# Patient Record
Sex: Female | Born: 1955
Health system: Southern US, Community
[De-identification: ages and names within clinical notes are randomized; demographics above are authoritative.]

## PROBLEM LIST (undated history)

## (undated) DIAGNOSIS — S8263XA Displaced fracture of lateral malleolus of unspecified fibula, initial encounter for closed fracture: Secondary | ICD-10-CM

## (undated) DIAGNOSIS — I10 Essential (primary) hypertension: Secondary | ICD-10-CM

## (undated) DIAGNOSIS — E785 Hyperlipidemia, unspecified: Secondary | ICD-10-CM

## (undated) DIAGNOSIS — R195 Other fecal abnormalities: Secondary | ICD-10-CM

## (undated) HISTORY — DX: Displaced fracture of lateral malleolus of unspecified fibula, initial encounter for closed fracture: S82.63XA

## (undated) HISTORY — DX: Hyperlipidemia, unspecified: E78.5

## (undated) HISTORY — DX: Other fecal abnormalities: R19.5

## (undated) HISTORY — DX: Essential (primary) hypertension: I10

---

## 2001-01-20 DIAGNOSIS — I10 Essential (primary) hypertension: Secondary | ICD-10-CM

## 2001-01-20 HISTORY — DX: Essential (primary) hypertension: I10

## 2003-01-21 HISTORY — PX: CHOLECYSTECTOMY: SHX55

## 2005-01-20 HISTORY — PX: MOHS SURGERY: SUR867

## 2005-01-20 HISTORY — PX: SQUAMOUS CELL CARCINOMA EXCISION: SHX2433

## 2006-01-26 LAB — HM DEXA SCAN

## 2007-01-21 DIAGNOSIS — S8263XA Displaced fracture of lateral malleolus of unspecified fibula, initial encounter for closed fracture: Secondary | ICD-10-CM

## 2007-01-21 HISTORY — DX: Displaced fracture of lateral malleolus of unspecified fibula, initial encounter for closed fracture: S82.63XA

## 2010-04-17 LAB — PULMONARY FUNCTION TEST

## 2011-05-27 LAB — FECAL OCCULT BLOOD, GUAIAC: Fecal Occult Blood: NEGATIVE

## 2011-09-27 LAB — HM PAP SMEAR: HM Pap smear: NORMAL

## 2011-09-27 LAB — HM MAMMOGRAPHY: HM Mammogram: NORMAL

## 2012-01-27 ENCOUNTER — Encounter: Payer: Self-pay | Admitting: Internal Medicine

## 2012-01-27 ENCOUNTER — Ambulatory Visit (INDEPENDENT_AMBULATORY_CARE_PROVIDER_SITE_OTHER): Payer: BC Managed Care – HMO | Admitting: Internal Medicine

## 2012-01-27 VITALS — BP 118/76 | HR 75 | Temp 98.1°F | Resp 16 | Ht 65.0 in | Wt 148.0 lb

## 2012-01-27 DIAGNOSIS — Z9889 Other specified postprocedural states: Secondary | ICD-10-CM

## 2012-01-27 DIAGNOSIS — E785 Hyperlipidemia, unspecified: Secondary | ICD-10-CM

## 2012-01-27 DIAGNOSIS — Z86007 Personal history of in-situ neoplasm of skin: Secondary | ICD-10-CM

## 2012-01-27 DIAGNOSIS — Z1211 Encounter for screening for malignant neoplasm of colon: Secondary | ICD-10-CM

## 2012-01-27 DIAGNOSIS — Z85828 Personal history of other malignant neoplasm of skin: Secondary | ICD-10-CM

## 2012-01-27 DIAGNOSIS — I10 Essential (primary) hypertension: Secondary | ICD-10-CM

## 2012-01-27 MED ORDER — ZOLPIDEM TARTRATE 10 MG PO TABS: 10.0000 mg | ORAL_TABLET | Freq: Every evening | ORAL | Status: DC | PRN

## 2012-01-27 NOTE — Assessment & Plan Note (Signed)
Managed with  Zocor.  Last known LDl 118, TL 199,  HDL 70  on 20 mg in 2011.  Due for lfts and lipids

## 2012-01-27 NOTE — Progress Notes (Signed)
Patient ID: Amber Sullivan, female   DOB: [birth year], 57 y.o.   MRN: [id number]   Patient Active Problem List  Diagnosis  . Hyperlipidemia  . Hypertension    Subjective:  CC:   Chief Complaint  Patient presents with  . Establish Care    HPI:   Amber Sullivan is a 57 y.o. female who presents as a new patient to establish primary care with the chief complaint of need for Primary care.  She has relocated from [place] and has a history of hypertension since age 83 along with hyperlipidemia.  Both are controlled with meds. She has a history of intolerance to  ACE inhibitor which caused a dry cough which has improved but not resolved with losartan. She has had thorough testing with pulmonology at [facility] in [place]. Last PCP was Benjiman Core, family practice who left to work at [employer] in Jun 07, 2009.  GYN through [facility],  Last PAP smear 2013 August,  History of abnormals so gets them annually.  Prior colposcopies.  Mammograms every birthday. No prior colonoscopy.  Needs a  Dermatologist ,  History of moh's surgery 7 yrs ago for squamous cell taken of  Right naso labial fold.    History of insomnia since death of husband 07-Jun-2001,  Uses alprazolam prn now, not daily . 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     Past Medical History  Diagnosis Date  . Hyperlipidemia   . Hypertension     Past Surgical History  Procedure Date  . Cholecystectomy 2003/06/08  . Squamous cell carcinoma excision 2005-06-07    mole on face  . Mohs surgery Jun 07, 2005    squamou cell carcinoma rt nasolabial fold    Family History  Problem Relation Age of Onset  . Hypertension Mother   . Diabetes Mother     type 2  . Stroke Mother 2    massive, with aphasia and paraplegia  . Hypertension Father   . Hyperlipidemia Father   . Heart attack Father     vs PE during hospitalization for chest pain   . Heart disease Father   . Heart attack Maternal Grandfather   . Heart disease Paternal Grandmother     CHF  . Heart disease  Paternal Grandfather   . Heart attack Paternal Grandfather     History   Social History  . Marital Status: Married    Spouse Name: N/A    Number of Children: N/A  . Years of Education: N/A   Occupational History  . Not on file.   Social History Main Topics  . Smoking status: Never Smoker   . Smokeless tobacco: Not on file  . Alcohol Use: Yes  . Drug Use: No  . Sexually Active:    Other Topics Concern  . Not on file   Social History Narrative  . No narrative on file         @ALLHX @    Review of Systems:   The remainder of the review of systems was negative except those addressed in the HPI.       Objective:  BP 118/76  Pulse 75  Temp 98.1 F (36.7 C) (Oral)  Resp 16  Ht 5\' 5"  (1.651 m)  Wt 148 lb (67.132 kg)  BMI 24.63 kg/m2  SpO2 99%  General appearance: alert, cooperative and appears stated age Ears: normal TM's and external ear canals both ears Throat: lips, mucosa, and tongue normal; teeth and gums normal Neck: no adenopathy, no carotid bruit, supple, symmetrical,  trachea midline and thyroid not enlarged, symmetric, no tenderness/mass/nodules Back: symmetric, no curvature. ROM normal. No CVA tenderness. Lungs: clear to auscultation bilaterally Heart: regular rate and rhythm, S1, S2 normal, no murmur, click, rub or gallop Abdomen: soft, non-tender; bowel sounds normal; no masses,  no organomegaly Pulses: 2+ and symmetric Skin: Skin color, texture, turgor normal. No rashes or lesions Lymph nodes: Cervical, supraclavicular, and axillary nodes normal.  Assessment and Plan:  Hyperlipidemia Managed with  Zocor.  Last known LDl 118, TL 199,  HDL 70  on 20 mg in 2011.  Due for lfts and lipids    Hypertension well controlled on losartan.  Due for BMET   Updated Medication List Outpatient Encounter Prescriptions as of 01/27/2012  Medication Sig Dispense Refill  . estradiol (ESTRACE) 0.1 MG/GM vaginal cream Place 2 g vaginally 2 (two) times a  week.      . losartan (COZAAR) 100 MG tablet Take 100 mg by mouth daily.       . simvastatin (ZOCOR) 20 MG tablet Take 20 mg by mouth at bedtime.       Marland Kitchen zolpidem (AMBIEN) 10 MG tablet Take 1 tablet (10 mg total) by mouth at bedtime as needed.  90 tablet  3  . [DISCONTINUED] zolpidem (AMBIEN) 10 MG tablet Take 10 mg by mouth at bedtime as needed.         Orders Placed This Encounter  Procedures  . HM MAMMOGRAPHY  . HM DEXA SCAN  . Fecal Occult Blood, Guaiac    No Follow-up on file.

## 2012-01-27 NOTE — Assessment & Plan Note (Signed)
well controlled on losartan.  Due for BMET

## 2012-01-27 NOTE — Patient Instructions (Signed)
Referrals to dermatology and GI in process,   Set up an appt for fasting labs including tsh and vit d

## 2012-01-28 DIAGNOSIS — Z1211 Encounter for screening for malignant neoplasm of colon: Secondary | ICD-10-CM | POA: Insufficient documentation

## 2012-01-28 DIAGNOSIS — Z86007 Personal history of in-situ neoplasm of skin: Secondary | ICD-10-CM | POA: Insufficient documentation

## 2012-01-28 DIAGNOSIS — Z9889 Other specified postprocedural states: Secondary | ICD-10-CM | POA: Insufficient documentation

## 2012-01-28 NOTE — Assessment & Plan Note (Signed)
2007, right nasolabial fold.

## 2012-01-28 NOTE — Assessment & Plan Note (Signed)
She has had annual fecal occult blood tests done during her GYN exam but has not had a prior colonoscopy. She has no family history of colon cancer and no personal history of hematochezia. Referral to Dr. if Jerene Dilling Dr. Mechele Collin for colon cancer screening.

## 2012-01-28 NOTE — Addendum Note (Signed)
Addended by: Sherlene Shams on: 01/28/2012 07:20 AM   Modules accepted: Orders

## 2012-02-12 ENCOUNTER — Telehealth: Payer: Self-pay | Admitting: *Deleted

## 2012-02-12 NOTE — Telephone Encounter (Signed)
Pt is coming in for labs on 01.28.2014 what labs and dx would you like?

## 2012-02-12 NOTE — Telephone Encounter (Signed)
CMET, fasting lipids, TSH . thanks

## 2012-02-13 ENCOUNTER — Other Ambulatory Visit: Payer: Self-pay | Admitting: *Deleted

## 2012-02-16 ENCOUNTER — Telehealth: Payer: Self-pay | Admitting: *Deleted

## 2012-02-16 NOTE — Telephone Encounter (Signed)
Prior Authorization faxed to [phone]

## 2012-02-17 ENCOUNTER — Other Ambulatory Visit: Payer: Self-pay | Admitting: *Deleted

## 2012-02-17 ENCOUNTER — Other Ambulatory Visit (INDEPENDENT_AMBULATORY_CARE_PROVIDER_SITE_OTHER): Payer: BC Managed Care – HMO

## 2012-02-17 DIAGNOSIS — Z139 Encounter for screening, unspecified: Secondary | ICD-10-CM

## 2012-02-17 LAB — COMPREHENSIVE METABOLIC PANEL
ALT: 16 U/L (ref 0–35)
AST: 15 U/L (ref 0–37)
Albumin: 3.7 g/dL (ref 3.5–5.2)
Alkaline Phosphatase: 61 U/L (ref 39–117)
BUN: 10 mg/dL (ref 6–23)
CO2: 28 mEq/L (ref 19–32)
Calcium: 8.8 mg/dL (ref 8.4–10.5)
Chloride: 106 mEq/L (ref 96–112)
Creatinine, Ser: 0.7 mg/dL (ref 0.4–1.2)
GFR: 85.57 mL/min (ref >60.00)
Glucose, Bld: 89 mg/dL (ref 70–99)
Potassium: 4.3 mEq/L (ref 3.5–5.1)
Sodium: 139 mEq/L (ref 135–145)
Total Bilirubin: 1 mg/dL (ref 0.3–1.2)
Total Protein: 6.4 g/dL (ref 6.0–8.3)

## 2012-02-17 LAB — LIPID PANEL
Cholesterol: 171 mg/dL (ref 0–200)
HDL: 51.8 mg/dL (ref >39.00)
LDL Cholesterol: 104 mg/dL — ABNORMAL HIGH (ref 0–99)
Total CHOL/HDL Ratio: 3
Triglycerides: 74 mg/dL (ref 0.0–149.0)
VLDL: 14.8 mg/dL (ref 0.0–40.0)

## 2012-02-17 LAB — TSH: TSH: 1.19 u[IU]/mL (ref 0.35–5.50)

## 2012-04-14 ENCOUNTER — Telehealth: Payer: Self-pay | Admitting: General Practice

## 2012-04-14 MED ORDER — SIMVASTATIN 20 MG PO TABS: 20.0000 mg | ORAL_TABLET | Freq: Every day | ORAL | Status: DC

## 2012-04-14 NOTE — Telephone Encounter (Signed)
Pt called stating she needs a refill of her simvastatin. This is a historical med never filled by our office. Lipid panel completed by our office on 02/17/12. Please advise.

## 2012-04-14 NOTE — Telephone Encounter (Signed)
Med filled.  

## 2012-04-14 NOTE — Telephone Encounter (Signed)
Ok to refill,  Authorized in [employer].  No pharmacy listed

## 2012-05-10 ENCOUNTER — Telehealth: Payer: Self-pay | Admitting: *Deleted

## 2012-05-10 ENCOUNTER — Encounter: Payer: Self-pay | Admitting: *Deleted

## 2012-05-10 DIAGNOSIS — E785 Hyperlipidemia, unspecified: Secondary | ICD-10-CM

## 2012-05-10 DIAGNOSIS — Z79899 Other long term (current) drug therapy: Secondary | ICD-10-CM

## 2012-05-10 MED ORDER — SIMVASTATIN 20 MG PO TABS: 20.0000 mg | ORAL_TABLET | Freq: Every day | ORAL | Status: DC

## 2012-05-10 MED ORDER — LOSARTAN POTASSIUM 100 MG PO TABS: 100.0000 mg | ORAL_TABLET | Freq: Every day | ORAL | Status: DC

## 2012-05-10 NOTE — Telephone Encounter (Signed)
Patient called stating that she requested a refill on her BP and cholesterol medication a month ago. Patient states that she checked with [facility] and they have no record of receiving the refills. Refills sent to pharmacy electronically per patient's request. Patient aware. Please advise when patient needs liver function test?

## 2012-05-10 NOTE — Telephone Encounter (Signed)
The chart has a record of Shanda Bumps calling the simvastatin to [facility] on 3/26 , no mention of BP meds.  Ok to refill both,  Due for fasting lipids and CMET end of July

## 2012-08-24 ENCOUNTER — Telehealth: Payer: Self-pay | Admitting: *Deleted

## 2012-08-24 NOTE — Telephone Encounter (Signed)
This patient never returned for her blood tests in July.  Please remind her to come in ASAP for fasting labs and follow up OV

## 2012-08-24 NOTE — Telephone Encounter (Signed)
Patient requesting refill on Ambien, but need clarification on pharmacy, called patient left voicemail for return call. Patient has not been seen in office since 1/14, please advise as to refill.

## 2012-08-25 NOTE — Telephone Encounter (Signed)
Left message for patient to return call.

## 2012-08-27 NOTE — Telephone Encounter (Signed)
Patient lab scheduled for 9/9

## 2012-08-30 ENCOUNTER — Ambulatory Visit (INDEPENDENT_AMBULATORY_CARE_PROVIDER_SITE_OTHER): Payer: BC Managed Care – HMO | Admitting: Internal Medicine

## 2012-08-30 ENCOUNTER — Encounter: Payer: Self-pay | Admitting: Internal Medicine

## 2012-08-30 VITALS — BP 134/72 | HR 79 | Temp 98.2°F | Resp 14 | Wt 145.0 lb

## 2012-08-30 DIAGNOSIS — R609 Edema, unspecified: Secondary | ICD-10-CM

## 2012-08-30 DIAGNOSIS — I10 Essential (primary) hypertension: Secondary | ICD-10-CM

## 2012-08-30 MED ORDER — TRAMADOL HCL 50 MG PO TABS: 50.0000 mg | ORAL_TABLET | Freq: Three times a day (TID) | ORAL | Status: DC | PRN

## 2012-08-30 MED ORDER — MELOXICAM 15 MG PO TABS: 15.0000 mg | ORAL_TABLET | Freq: Every day | ORAL | Status: DC

## 2012-08-30 NOTE — Progress Notes (Signed)
Patient ID: Amber Sullivan, female   DOB: 09-Oct-1955, 57 y.o.   MRN: [id number]  Patient Active Problem List   Diagnosis Date Noted  . Edema 08/30/2012  . History of Mohs surgery for squamous cell carcinoma in situ of skin 01/28/2012  . Screening for colon cancer 01/28/2012  . Hyperlipidemia   . Hypertension     Subjective:  CC:   Chief Complaint  Patient presents with  . Acute Visit    ankle and feet swelling, if patient elevates legs sweliing disapates in about an hour.    HPI:   Amber Sullivan a 57 y.o. female who presents with edema.  Bilateral ankle edema for several weeks.  She has not had any recent travel. She has been working quite aggressively in her yard and has been having joint pain which is been treated with Aleve and ibuprofen daily for at least 4-5 times per week for several  weeks.   Past Medical History  Diagnosis Date  . Hyperlipidemia   . Hypertension     Past Surgical History  Procedure Laterality Date  . Cholecystectomy  2005  . Squamous cell carcinoma excision  2007    mole on face  . Mohs surgery  2007    squamou cell carcinoma rt nasolabial fold       The following portions of the patient's history were reviewed and updated as appropriate: Allergies, current medications, and problem list.    Review of Systems:   12 Pt  review of systems was negative except those addressed in the HPI,     History   Social History  . Marital Status: Married    Spouse Name: N/A    Number of Children: N/A  . Years of Education: N/A   Occupational History  . Not on file.   Social History Main Topics  . Smoking status: Never Smoker   . Smokeless tobacco: Never Used  . Alcohol Use: Yes  . Drug Use: No  . Sexually Active: Not on file   Other Topics Concern  . Not on file   Social History Narrative  . No narrative on file    Objective:  Filed Vitals:   08/30/12 1609  BP: 134/72  Pulse: 79  Temp: 98.2 F (36.8 C)  Resp: 14      General appearance: alert, cooperative and appears stated age Ears: normal TM's and external ear canals both ears Throat: lips, mucosa, and tongue normal; teeth and gums normal Neck: no adenopathy, no carotid bruit, supple, symmetrical, trachea midline and thyroid not enlarged, symmetric, no tenderness/mass/nodules Back: symmetric, no curvature. ROM normal. No CVA tenderness. Lungs: clear to auscultation bilaterally Heart: regular rate and rhythm, S1, S2 normal, no murmur, click, rub or gallop Abdomen: soft, non-tender; bowel sounds normal; no masses,  no organomegaly Pulses: 2+ and symmetric Skin: Skin color, texture, turgor normal. No rashes or lesions.  Nonpitting ankle edema Lymph nodes: Cervical, supraclavicular, and axillary nodes normal.  Assessment and Plan:  Edema Ruling out renal dysfunction, nephropathy, hypothyroidism and congestive heart failure with blood work. Patient will stop/suspend all NSAIDs to see if edema resolves. If all labs are normal and fluid retention resolves she can resume either ibuprofen or meloxicam when necessary an alternative tramadol/Tylenol   Updated Medication List Outpatient Encounter Prescriptions as of 08/30/2012  Medication Sig Dispense Refill  . aspirin 81 MG tablet Take 81 mg by mouth daily.      Marland Kitchen estradiol (ESTRACE) 0.1 MG/GM vaginal cream Place 2 g vaginally  2 (two) times a week.      . fish oil-omega-3 fatty acids 1000 MG capsule Take 2 g by mouth daily.      Marland Kitchen losartan (COZAAR) 100 MG tablet Take 1 tablet (100 mg total) by mouth daily.  90 tablet  1  . simvastatin (ZOCOR) 20 MG tablet Take 1 tablet (20 mg total) by mouth at bedtime.  90 tablet  1  . zolpidem (AMBIEN) 10 MG tablet Take 1 tablet (10 mg total) by mouth at bedtime as needed.  90 tablet  3  . meloxicam (MOBIC) 15 MG tablet Take 1 tablet (15 mg total) by mouth daily.  30 tablet  3  . traMADol (ULTRAM) 50 MG tablet Take 1 tablet (50 mg total) by mouth every 8 (eight) hours  as needed for pain.  60 tablet  0   No facility-administered encounter medications on file as of 08/30/2012.     Orders Placed This Encounter  Procedures  . Comprehensive metabolic panel  . TSH  . Microalbumin / creatinine urine ratio    No Follow-up on file.

## 2012-08-30 NOTE — Patient Instructions (Addendum)
Your Fluid retention may be due to the ibuprofen/aleve  you have been taking for joint pain   suspend all NSAIDs:  aleve,  Motrin,  But continue your  aspirin (81 mg daily)    If labs are normal, you can resume  Motrin/aleve   sparingly or try  the meloxicam  once daily .  You can also combine any of these with tylenol but not with each other,  You may also use tylenol and tramadol (both are pain relievers,  Not anti  Inflammatories)    tylenol max dose daily is  2000 mg   In a 24 hour base

## 2012-08-30 NOTE — Assessment & Plan Note (Signed)
Ruling out renal dysfunction, nephropathy, hypothyroidism and congestive heart failure with blood work. Patient will stop/suspend all NSAIDs to see if edema resolves. If all labs are normal and fluid retention resolves she can resume either ibuprofen or meloxicam when necessary an alternative tramadol/Tylenol

## 2012-08-31 LAB — COMPREHENSIVE METABOLIC PANEL
ALT: 18 U/L (ref 0–35)
AST: 19 U/L (ref 0–37)
Albumin: 4 g/dL (ref 3.5–5.2)
Alkaline Phosphatase: 62 U/L (ref 39–117)
BUN: 12 mg/dL (ref 6–23)
CO2: 25 mEq/L (ref 19–32)
Calcium: 9.2 mg/dL (ref 8.4–10.5)
Chloride: 104 mEq/L (ref 96–112)
Creatinine, Ser: 0.7 mg/dL (ref 0.4–1.2)
GFR: 85.41 mL/min (ref >60.00)
Glucose, Bld: 75 mg/dL (ref 70–99)
Potassium: 4.4 mEq/L (ref 3.5–5.1)
Sodium: 140 mEq/L (ref 135–145)
Total Bilirubin: 0.7 mg/dL (ref 0.3–1.2)
Total Protein: 6.9 g/dL (ref 6.0–8.3)

## 2012-08-31 LAB — TSH: TSH: 1.67 u[IU]/mL (ref 0.35–5.50)

## 2012-08-31 LAB — MICROALBUMIN / CREATININE URINE RATIO
Creatinine,U: 42.5 mg/dL
Microalb Creat Ratio: 1.2 mg/g (ref 0.0–30.0)
Microalb, Ur: 0.5 mg/dL (ref 0.0–1.9)

## 2012-09-01 ENCOUNTER — Encounter: Payer: Self-pay | Admitting: *Deleted

## 2012-09-06 ENCOUNTER — Telehealth: Payer: Self-pay | Admitting: Internal Medicine

## 2012-09-06 MED ORDER — ZOLPIDEM TARTRATE 10 MG PO TABS: 10.0000 mg | ORAL_TABLET | Freq: Every evening | ORAL | Status: DC | PRN

## 2012-09-06 NOTE — Telephone Encounter (Signed)
I'm a patient of Dr. Darrick Huntsman. I was in the office on Monday, August 11. I'd asked for a prescription to be called in to [facility] for Ambien. Cathy, Dr. Melina Schools nurse, said a one-month prescription had already been called in. I checked online with [facility] today and had spoke with them by phone last week. There's no current prescription called in for me. I can't renew automatically because it's a limited 50-month prescription, which ended July 27, 2012.  I can come in and pick up the prescription next week; I want to take the Ambien with me on an upcoming trip to [place]. My cellphone: [phone].  Amber Sullivan

## 2012-09-06 NOTE — Telephone Encounter (Signed)
[facility] on file have canceled that script have new one printed. Have new one printed will call patient. Script signed and faxed.

## 2012-09-28 ENCOUNTER — Other Ambulatory Visit (INDEPENDENT_AMBULATORY_CARE_PROVIDER_SITE_OTHER): Payer: BC Managed Care – HMO

## 2012-09-28 DIAGNOSIS — E785 Hyperlipidemia, unspecified: Secondary | ICD-10-CM

## 2012-09-28 DIAGNOSIS — Z79899 Other long term (current) drug therapy: Secondary | ICD-10-CM

## 2012-09-28 LAB — COMPREHENSIVE METABOLIC PANEL
ALT: 17 U/L (ref 0–35)
AST: 21 U/L (ref 0–37)
Albumin: 3.8 g/dL (ref 3.5–5.2)
Alkaline Phosphatase: 56 U/L (ref 39–117)
BUN: 10 mg/dL (ref 6–23)
CO2: 26 mEq/L (ref 19–32)
Calcium: 8.9 mg/dL (ref 8.4–10.5)
Chloride: 104 mEq/L (ref 96–112)
Creatinine, Ser: 0.8 mg/dL (ref 0.4–1.2)
GFR: 75.85 mL/min (ref >60.00)
Glucose, Bld: 99 mg/dL (ref 70–99)
Potassium: 4.2 mEq/L (ref 3.5–5.1)
Sodium: 139 mEq/L (ref 135–145)
Total Bilirubin: 0.9 mg/dL (ref 0.3–1.2)
Total Protein: 6.4 g/dL (ref 6.0–8.3)

## 2012-09-28 LAB — LIPID PANEL
Cholesterol: 182 mg/dL (ref 0–200)
HDL: 52.7 mg/dL (ref >39.00)
LDL Cholesterol: 116 mg/dL — ABNORMAL HIGH (ref 0–99)
Total CHOL/HDL Ratio: 3
Triglycerides: 69 mg/dL (ref 0.0–149.0)
VLDL: 13.8 mg/dL (ref 0.0–40.0)

## 2012-10-01 ENCOUNTER — Encounter: Payer: Self-pay | Admitting: *Deleted

## 2012-10-11 ENCOUNTER — Encounter: Payer: Self-pay | Admitting: *Deleted

## 2012-10-12 ENCOUNTER — Ambulatory Visit (INDEPENDENT_AMBULATORY_CARE_PROVIDER_SITE_OTHER): Payer: BC Managed Care – HMO | Admitting: Internal Medicine

## 2012-10-12 ENCOUNTER — Other Ambulatory Visit: Payer: BC Managed Care – HMO | Admitting: Internal Medicine

## 2012-10-12 ENCOUNTER — Encounter: Payer: Self-pay | Admitting: Internal Medicine

## 2012-10-12 ENCOUNTER — Other Ambulatory Visit (HOSPITAL_COMMUNITY)
Admission: RE | Admit: 2012-10-12 | Discharge: 2012-10-12 | Disposition: A | Discharge status: Home / Self Care | Payer: BC Managed Care – HMO | Source: Ambulatory Visit | Attending: Internal Medicine | Admitting: Internal Medicine

## 2012-10-12 VITALS — BP 138/80 | HR 64 | Temp 98.4°F | Resp 14 | Ht 65.0 in | Wt 146.5 lb

## 2012-10-12 DIAGNOSIS — Z1211 Encounter for screening for malignant neoplasm of colon: Secondary | ICD-10-CM

## 2012-10-12 DIAGNOSIS — Z124 Encounter for screening for malignant neoplasm of cervix: Secondary | ICD-10-CM

## 2012-10-12 DIAGNOSIS — Z23 Encounter for immunization: Secondary | ICD-10-CM

## 2012-10-12 DIAGNOSIS — Z01419 Encounter for gynecological examination (general) (routine) without abnormal findings: Secondary | ICD-10-CM | POA: Insufficient documentation

## 2012-10-12 DIAGNOSIS — E785 Hyperlipidemia, unspecified: Secondary | ICD-10-CM

## 2012-10-12 DIAGNOSIS — I10 Essential (primary) hypertension: Secondary | ICD-10-CM

## 2012-10-12 DIAGNOSIS — Z0000 Encounter for general adult medical examination without abnormal findings: Secondary | ICD-10-CM

## 2012-10-12 DIAGNOSIS — Z1151 Encounter for screening for human papillomavirus (HPV): Secondary | ICD-10-CM | POA: Insufficient documentation

## 2012-10-12 DIAGNOSIS — Z1239 Encounter for other screening for malignant neoplasm of breast: Secondary | ICD-10-CM

## 2012-10-12 DIAGNOSIS — R609 Edema, unspecified: Secondary | ICD-10-CM

## 2012-10-12 NOTE — Progress Notes (Signed)
Patient ID: Amber Sullivan, female   DOB: [birth year], 57 y.o.   MRN: [id number]    Subjective:    Amber Sullivan is a 57 y.o. female who presents for an annual exam. The patient has no complaints today. The patient is sexually active. GYN screening history: last pap: was normal. The patient wears seatbelts: yes. The patient participates in regular exercise: yes. Has the patient ever been transfused or tattooed?: no. The patient reports that there is not domestic violence in her life.   Menstrual History: OB History   Grav Para Term Preterm Abortions TAB SAB Ect Mult Living                  Menarche age: 36  No LMP recorded. Patient is postmenopausal.    The following portions of the patient's history were reviewed and updated as appropriate: allergies, current medications, past family history, past medical history, past social history, past surgical history and problem list.  Review of Systems A comprehensive review of systems was negative.    Objective:   BP 138/80  Pulse 64  Temp(Src) 98.4 F (36.9 C) (Oral)  Resp 14  Ht 5\' 5"  (1.651 m)  Wt 146 lb 8 oz (66.452 kg)  BMI 24.38 kg/m2  SpO2 99%  General Appearance:    Alert, cooperative, no distress, appears stated age  Head:    Normocephalic, without obvious abnormality, atraumatic  Eyes:    PERRL, conjunctiva/corneas clear, EOM's intact, fundi    benign, both eyes  Ears:    Normal TM's and external ear canals, both ears  Nose:   Nares normal, septum midline, mucosa normal, no drainage    or sinus tenderness  Throat:   Lips, mucosa, and tongue normal; teeth and gums normal  Neck:   Supple, symmetrical, trachea midline, no adenopathy;    thyroid:  no enlargement/tenderness/nodules; no carotid   bruit or JVD  Back:     Symmetric, no curvature, ROM normal, no CVA tenderness  Lungs:     Clear to auscultation bilaterally, respirations unlabored  Chest Wall:    No tenderness or deformity   Heart:    Regular rate and rhythm, S1  and S2 normal, no murmur, rub   or gallop  Breast Exam:    No tenderness, masses, or nipple abnormality  Abdomen:     Soft, non-tender, bowel sounds active all four quadrants,    no masses, no organomegaly  Genitalia:    Pelvic: cervix normal in appearance, external genitalia normal, no adnexal masses or tenderness, no cervical motion tenderness, rectovaginal septum normal, uterus normal size, shape, and consistency and vagina normal without discharge  Extremities:   Extremities normal, atraumatic, no cyanosis or edema  Pulses:   2+ and symmetric all extremities  Skin:   Skin color, texture, turgor normal, no rashes or lesions  Lymph nodes:   Cervical, supraclavicular, and axillary nodes normal  Neurologic:   CNII-XII intact, normal strength, sensation and reflexes    throughout     Assessment:   Edema Secondary to use of NSAIDs.  Now resolved.  Workup for other causes negative  Hyperlipidemia LDL and triglycerides are at goal on current medications. She has no side effects and liver enzymes are normal. No changes today   Hypertension Well controlled on current regimen. Renal function stable, no changes today.  Routine general medical examination at a health care facility Annual comprehensive exam was done including breast, pelvic and PAP smear. All screenings have been addressed .  Screening for colon cancer She never had the colonoscopy due to loss of Dr Niel Hummer to practice.  IFOBs gven   Updated Medication List Outpatient Encounter Prescriptions as of 10/12/2012  Medication Sig Dispense Refill  . aspirin 81 MG tablet Take 81 mg by mouth daily.      Marland Kitchen estradiol (ESTRACE) 0.1 MG/GM vaginal cream Place 2 g vaginally 2 (two) times a week.      . fish oil-omega-3 fatty acids 1000 MG capsule Take 2 g by mouth daily.      Marland Kitchen losartan (COZAAR) 100 MG tablet Take 1 tablet (100 mg total) by mouth daily.  90 tablet  1  . simvastatin (ZOCOR) 20 MG tablet Take 1 tablet (20 mg total) by  mouth at bedtime.  90 tablet  1  . zolpidem (AMBIEN) 10 MG tablet Take 1 tablet (10 mg total) by mouth at bedtime as needed.  90 tablet  3  . meloxicam (MOBIC) 15 MG tablet Take 1 tablet (15 mg total) by mouth daily.  30 tablet  3  . traMADol (ULTRAM) 50 MG tablet Take 1 tablet (50 mg total) by mouth every 8 (eight) hours as needed for pain.  60 tablet  0   No facility-administered encounter medications on file as of 10/12/2012.

## 2012-10-12 NOTE — Patient Instructions (Addendum)
You are up to date on all vaccinations  Please return the fecal test when convenient.  This is your annual colon ca screening test   Mammogram to be set up at a [facility] facility  Return in 6 months   [email or web address] (per discussion)

## 2012-10-13 DIAGNOSIS — Z0000 Encounter for general adult medical examination without abnormal findings: Secondary | ICD-10-CM | POA: Insufficient documentation

## 2012-10-13 NOTE — Assessment & Plan Note (Signed)
She never had the colonoscopy due to loss of Dr Niel Hummer to practice.  IFOBs gven

## 2012-10-13 NOTE — Assessment & Plan Note (Signed)
LDL and triglycerides are at goal on current medications. She has no side effects and liver enzymes are normal. No changes today.  

## 2012-10-13 NOTE — Assessment & Plan Note (Signed)
Well controlled on current regimen. Renal function stable, no changes today. 

## 2012-10-13 NOTE — Assessment & Plan Note (Signed)
Secondary to use of NSAIDs.  Now resolved.  Workup for other causes negative   

## 2012-10-13 NOTE — Assessment & Plan Note (Signed)
Annual comprehensive exam was done including breast, pelvic and PAP smear. All screenings have been addressed .  

## 2012-10-14 ENCOUNTER — Other Ambulatory Visit: Payer: Self-pay | Admitting: Internal Medicine

## 2012-10-15 ENCOUNTER — Encounter: Payer: Self-pay | Admitting: *Deleted

## 2012-11-01 ENCOUNTER — Telehealth: Payer: Self-pay | Admitting: Internal Medicine

## 2012-11-01 NOTE — Telephone Encounter (Signed)
Left vm.  States she is returning call.  No other info given.

## 2012-11-02 NOTE — Telephone Encounter (Signed)
Left message for pt to return my call.

## 2012-11-26 ENCOUNTER — Other Ambulatory Visit: Payer: Self-pay | Admitting: Internal Medicine

## 2013-03-20 ENCOUNTER — Other Ambulatory Visit: Payer: Self-pay | Admitting: Internal Medicine

## 2013-03-21 ENCOUNTER — Telehealth: Payer: Self-pay | Admitting: Internal Medicine

## 2013-03-21 NOTE — Telephone Encounter (Signed)
Patient Information:  Caller Name: Amber Sullivan  Phone: [phone]  Patient: Amber Sullivan  Gender: Female  DOB: 05/11/1955  Age: 58 Years  PCP: Deborra Medina (Adults only)  Office Follow Up:  Does the office need to follow up with this patient?: No  Instructions For The Office: N/A  RN Note:  Offered to check on appt but pt states that she called office and the soonest that she can get in is noon on  03/22/13 and pt is unable to make that time.  Suggested UC since her work schedule and office's Scientist, physiological.  Instructed that our office recommends any [facility] but pt states that she is in [place] and lives in [place] and perfers an UC in that area; also gave her UC in [place] area. will comply  Symptoms  Reason For Call & Symptoms: Pt is calling and states that her outer right eyeball is red; sx started 03/18/13; no discharge; no itching; no pain; no injury that she is aware of  Reviewed Health History In EMR: Yes  Reviewed Medications In EMR: Yes  Reviewed Allergies In EMR: Yes  Reviewed Surgeries / Procedures: Yes  Date of Onset of Symptoms: 03/18/2013  Treatments Tried: lubrication eye drops  Treatments Tried Worked: No  Guideline(s) Used:  Eye - Red Without Pus  Disposition Per Guideline:   See Today in Office  Reason For Disposition Reached:   Only 1 eye is red, and persists > 48 hours  Advice Given:  Call Back If:  You become worse.  Patient Refused Recommendation:  Patient Will [facility] To [facility]  Due to work schedule

## 2013-03-21 NOTE — Telephone Encounter (Signed)
FYI-pt to be seen at Urgent Care due to work schedule

## 2013-04-27 ENCOUNTER — Telehealth: Payer: Self-pay | Admitting: Internal Medicine

## 2013-04-27 ENCOUNTER — Other Ambulatory Visit: Payer: Self-pay | Admitting: Internal Medicine

## 2013-04-27 ENCOUNTER — Telehealth: Payer: Self-pay | Admitting: *Deleted

## 2013-04-27 MED ORDER — ZOLPIDEM TARTRATE 10 MG PO TABS: 10.0000 mg | ORAL_TABLET | Freq: Every evening | ORAL | Status: DC | PRN

## 2013-04-27 NOTE — Telephone Encounter (Signed)
Patient notified and stated will call and set up appointment.

## 2013-04-27 NOTE — Telephone Encounter (Signed)
Patient called for refill on ambien. Please advise.

## 2013-04-27 NOTE — Telephone Encounter (Signed)
30 days only,  Has to be seen

## 2013-04-27 NOTE — Telephone Encounter (Signed)
Pt left vm.  LMTCB.  Pt needs appt for medication f/u.dms

## 2013-05-02 ENCOUNTER — Other Ambulatory Visit: Payer: Self-pay | Admitting: Internal Medicine

## 2013-05-09 ENCOUNTER — Encounter: Payer: Self-pay | Admitting: Internal Medicine

## 2013-05-09 ENCOUNTER — Ambulatory Visit (INDEPENDENT_AMBULATORY_CARE_PROVIDER_SITE_OTHER): Payer: BC Managed Care – HMO | Admitting: Internal Medicine

## 2013-05-09 VITALS — BP 124/72 | HR 79 | Temp 98.1°F | Resp 16 | Wt 147.0 lb

## 2013-05-09 DIAGNOSIS — E785 Hyperlipidemia, unspecified: Secondary | ICD-10-CM

## 2013-05-09 DIAGNOSIS — M7661 Achilles tendinitis, right leg: Secondary | ICD-10-CM

## 2013-05-09 DIAGNOSIS — M25579 Pain in unspecified ankle and joints of unspecified foot: Secondary | ICD-10-CM

## 2013-05-09 DIAGNOSIS — M7660 Achilles tendinitis, unspecified leg: Secondary | ICD-10-CM

## 2013-05-09 DIAGNOSIS — Z79899 Other long term (current) drug therapy: Secondary | ICD-10-CM

## 2013-05-09 DIAGNOSIS — R609 Edema, unspecified: Secondary | ICD-10-CM

## 2013-05-09 DIAGNOSIS — I10 Essential (primary) hypertension: Secondary | ICD-10-CM

## 2013-05-09 LAB — COMPREHENSIVE METABOLIC PANEL
ALT: 18 U/L (ref 0–35)
AST: 21 U/L (ref 0–37)
Albumin: 3.8 g/dL (ref 3.5–5.2)
Alkaline Phosphatase: 56 U/L (ref 39–117)
BUN: 10 mg/dL (ref 6–23)
CO2: 26 mEq/L (ref 19–32)
Calcium: 9.2 mg/dL (ref 8.4–10.5)
Chloride: 108 mEq/L (ref 96–112)
Creatinine, Ser: 0.7 mg/dL (ref 0.4–1.2)
GFR: 95.56 mL/min (ref >60.00)
Glucose, Bld: 77 mg/dL (ref 70–99)
Potassium: 4.8 mEq/L (ref 3.5–5.1)
Sodium: 142 mEq/L (ref 135–145)
Total Bilirubin: 0.9 mg/dL (ref 0.3–1.2)
Total Protein: 6.5 g/dL (ref 6.0–8.3)

## 2013-05-09 MED ORDER — ZOLPIDEM TARTRATE 10 MG PO TABS: 0.5000 mg | ORAL_TABLET | Freq: Every evening | ORAL | Status: DC | PRN

## 2013-05-09 NOTE — Patient Instructions (Addendum)
Your calf pain may be from an achilles tendonitis. This may be caused by bone spurs from your prior ankle injury You can use advil,  Aleve or a prescribed NSAID (meloxicam as needed but all can cause fluid retention  You can use tramadol as a pain reliever every 6 hours as needed  and it can be combined with an NSAID or with tylenol   I would like you to see Dr Charlann Boxer,  Our sports medicine doc for evaluation and treatment.

## 2013-05-09 NOTE — Progress Notes (Signed)
Pre-visit discussion using our clinic review tool. No additional management support is needed unless otherwise documented below in the visit note.  

## 2013-05-09 NOTE — Progress Notes (Addendum)
Patient ID: Amber Sullivan, female   DOB: 08-04-1955, 58 y.o.   MRN: [id number]  Patient Active Problem List   Diagnosis Date Noted  . Tendonitis, Achilles, right 05/09/2013  . Routine general medical examination at a health care facility 10/13/2012  . Edema 08/30/2012  . History of Mohs surgery for squamous cell carcinoma in situ of skin 01/28/2012  . Screening for colon cancer 01/28/2012  . Hyperlipidemia   . Hypertension     Subjective:  CC:   Chief Complaint  Patient presents with  . Follow-up    medication refills    HPI:   Amber Sullivan is a 57 y.o. female who presents for 6 month follow up on hypertension and hyperlipidemia.  In the interim has developed right heel and calf pain of one weeks duration,  Worse in the morning before walking.  No recent travel,  immobilization or surgery. Has histroy of remote injury to same ankle/foot and has some symmetric ankle edema which is chronic.   Past Medical History  Diagnosis Date  . Hyperlipidemia   . Hypertension     Past Surgical History  Procedure Laterality Date  . Cholecystectomy  2005  . Squamous cell carcinoma excision  2007    mole on face  . Mohs surgery  2007    squamou cell carcinoma rt nasolabial fold       The following portions of the patient's history were reviewed and updated as appropriate: Allergies, current medications, and problem list.    Review of Systems:   Patient denies headache, fevers, malaise, unintentional weight loss, skin rash, eye pain, sinus congestion and sinus pain, sore throat, dysphagia,  hemoptysis , cough, dyspnea, wheezing, chest pain, palpitations, orthopnea, edema, abdominal pain, nausea, melena, diarrhea, constipation, flank pain, dysuria, hematuria, urinary  Frequency, nocturia, numbness, tingling, seizures,  Focal weakness, Loss of consciousness,  Tremor, insomnia, depression, anxiety, and suicidal ideation.     History   Social History  . Marital Status: Married   Spouse Name: N/A    Number of Children: N/A  . Years of Education: N/A   Occupational History  . Not on file.   Social History Main Topics  . Smoking status: Never Smoker   . Smokeless tobacco: Never Used  . Alcohol Use: Yes  . Drug Use: No  . Sexual Activity: Not on file   Other Topics Concern  . Not on file   Social History Narrative  . No narrative on file    Objective:  Filed Vitals:   05/09/13 0859  BP: 124/72  Pulse: 79  Temp: 98.1 F (36.7 C)  Resp: 16     General appearance: alert, cooperative and appears stated age Ears: normal TM's and external ear canals both ears Throat: lips, mucosa, and tongue normal; teeth and gums normal Neck: no adenopathy, no carotid bruit, supple, symmetrical, trachea midline and thyroid not enlarged, symmetric, no tenderness/mass/nodules Back: symmetric, no curvature. ROM normal. No CVA tenderness. Lungs: clear to auscultation bilaterally Heart: regular rate and rhythm, S1, S2 normal, no murmur, click, rub or gallop Abdomen: soft, non-tender; bowel sounds normal; no masses,  no organomegaly Pulses: 2+ and symmetric Skin: Skin color, texture, turgor normal. No rashes or lesions Lymph nodes: Cervical, supraclavicular, and axillary nodes normal. MSK: RLE:  no calf tenderness, ankle has restricted ROM with regard to dorsiflexion,  negative Homans sign   Assessment and Plan:  Hypertension Well controlled on current regimen. Renal function stable, no changes today.  Lab Results  Component Value  Date   CREATININE 0.7 05/09/2013    Lab Results  Component Value Date   NA 142 05/09/2013   K 4.8 05/09/2013   CL 108 05/09/2013   CO2 26 05/09/2013     Hyperlipidemia LDL and triglycerides are at goal on current medications. She has no side effects and liver enzymes are normal. No changes today   Lab Results  Component Value Date   CHOL 182 09/28/2012   HDL 52.70 09/28/2012   LDLCALC 116* 09/28/2012   TRIG 69.0 09/28/2012   CHOLHDL 3  09/28/2012   Lab Results  Component Value Date   ALT 18 05/09/2013   AST 21 05/09/2013   ALKPHOS 56 05/09/2013   BILITOT 0.9 05/09/2013     Edema Secondary to use of NSAIDs.  Now resolved.  Workup for other causes negative    Tendonitis, Achilles, right One week history of achilles pain aggravated by rest,  Improved with activity. 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  Very low probability for DVT so imaging was discussed but not done.  Referral to [facility] medicine since prior use of NSAIDS aggravated chronic edema.    Updated Medication List Outpatient Encounter Prescriptions as of 05/09/2013  Medication Sig  . estradiol (ESTRACE) 0.1 MG/GM vaginal cream Place 2 g vaginally 2 (two) times a week.  . fish oil-omega-3 fatty acids 1000 MG capsule Take 2 g by mouth daily.  Marland Kitchen losartan (COZAAR) 100 MG tablet TAKE 1 TABLET DAILY  . simvastatin (ZOCOR) 20 MG tablet Take 20 mg by mouth every other day.  . zolpidem (AMBIEN) 10 MG tablet Take 0.5 tablets (5 mg total) by mouth at bedtime as needed.  . [DISCONTINUED] simvastatin (ZOCOR) 20 MG tablet Take 1 tablet (20 mg total) by mouth at bedtime.  . [DISCONTINUED] zolpidem (AMBIEN) 10 MG tablet Take 1 tablet (10 mg total) by mouth at bedtime as needed.  . [DISCONTINUED] zolpidem (AMBIEN) 10 MG tablet Take 0.5 mg by mouth at bedtime as needed.  Marland Kitchen aspirin 81 MG tablet Take 81 mg by mouth daily.  . [DISCONTINUED] meloxicam (MOBIC) 15 MG tablet Take 1 tablet (15 mg total) by mouth daily.  . [DISCONTINUED] simvastatin (ZOCOR) 20 MG tablet TAKE 1 TABLET AT BEDTIME  . [DISCONTINUED] traMADol (ULTRAM) 50 MG tablet Take 1 tablet (50 mg total) by mouth every 8 (eight) hours as needed for pain.     Orders Placed This Encounter  Procedures  . Comprehensive metabolic panel  . Ambulatory referral to Sports Medicine    No Follow-up on file.

## 2013-05-10 ENCOUNTER — Encounter: Payer: Self-pay | Admitting: *Deleted

## 2013-05-10 ENCOUNTER — Encounter: Payer: Self-pay | Admitting: Internal Medicine

## 2013-05-10 NOTE — Assessment & Plan Note (Signed)
Well controlled on current regimen. Renal function stable, no changes today.  Lab Results  Component Value Date   CREATININE 0.7 05/09/2013    Lab Results  Component Value Date   NA 142 05/09/2013   K 4.8 05/09/2013   CL 108 05/09/2013   CO2 26 05/09/2013

## 2013-05-10 NOTE — Assessment & Plan Note (Signed)
LDL and triglycerides are at goal on current medications. She has no side effects and liver enzymes are normal. No changes today   Lab Results  Component Value Date   CHOL 182 09/28/2012   HDL 52.70 09/28/2012   LDLCALC 116* 09/28/2012   TRIG 69.0 09/28/2012   CHOLHDL 3 09/28/2012   Lab Results  Component Value Date   ALT 18 05/09/2013   AST 21 05/09/2013   ALKPHOS 56 05/09/2013   BILITOT 0.9 05/09/2013

## 2013-05-10 NOTE — Assessment & Plan Note (Signed)
One week history of achilles pain aggravated by rest,  Improved with activity. 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  Very low probability for DVT so imaging was discussed but not done.  Referral to [facility] medicine since prior use of NSAIDS aggravated chronic edema.

## 2013-05-10 NOTE — Assessment & Plan Note (Signed)
Secondary to use of NSAIDs.  Now resolved.  Workup for other causes negative

## 2013-05-20 ENCOUNTER — Encounter: Payer: Self-pay | Admitting: Family Medicine

## 2013-05-20 ENCOUNTER — Ambulatory Visit (INDEPENDENT_AMBULATORY_CARE_PROVIDER_SITE_OTHER): Payer: BC Managed Care – PPO | Admitting: Family Medicine

## 2013-05-20 ENCOUNTER — Other Ambulatory Visit (INDEPENDENT_AMBULATORY_CARE_PROVIDER_SITE_OTHER): Payer: BC Managed Care – PPO

## 2013-05-20 VITALS — BP 130/70 | HR 90 | Wt 144.0 lb

## 2013-05-20 DIAGNOSIS — M7660 Achilles tendinitis, unspecified leg: Secondary | ICD-10-CM

## 2013-05-20 DIAGNOSIS — M7661 Achilles tendinitis, right leg: Secondary | ICD-10-CM

## 2013-05-20 DIAGNOSIS — M722 Plantar fascial fibromatosis: Secondary | ICD-10-CM | POA: Insufficient documentation

## 2013-05-20 MED ORDER — DICLOFENAC SODIUM 2 % TD SOLN: 2.0000 "application " | Freq: Two times a day (BID) | TRANSDERMAL | Status: DC

## 2013-05-20 NOTE — Assessment & Plan Note (Signed)
Ultrasound shows today that patient does have a tear in her plantar fasciitis but no significant enlargement of the plantar fascia. I do not see any significant swelling of the posterior tibialis but some mild swelling around the insertion of the Achilles. I think this is more of a posterior capsule injury. Patient went home exercises, icing protocol, topical anti-inflammatories and we discussed proper shoe choices. Patient will come back again in 3-4 weeks for further evaluation. She continues to have pain we can consider custom orthotics versus injection versus formal physical therapy.

## 2013-05-20 NOTE — Progress Notes (Signed)
[facility] [address], [place] [address] Phone: [phone] Subjective:    I'm seeing this patient by the request  of:  TULLO,TERESA, MD   CC: Ankle pain right  [id number] Amber Sullivan is a 58 y.o. female coming in with complaint of right ankle pain. Patient is muscle pain seems to be in the heel area. Patient states it seems to radiate up her calf. Patient states has been going on for approximately 3 weeks. Worse when she stands up in the morning or after sitting a long amount of time. Seems to do better when she does more activity. Denies any swelling and denies any true injury. Patient does give a past medical history significant for talus fracture that was treated conservatively multiple years ago. Patient has tried changing shoes and some over-the-counter anti-inflammatories and partially cause worsening peripheral edema. Patient rates that the pain when it occurs is 9/10 in severity but seems to dissipate fairly quickly.     Past medical history, social, surgical and family history all reviewed in electronic medical record.   Review of Systems: No headache, visual changes, nausea, vomiting, diarrhea, constipation, dizziness, abdominal pain, skin rash, fevers, chills, night sweats, weight loss, swollen lymph nodes, body aches, joint swelling, muscle aches, chest pain, shortness of breath, mood changes.   Objective Blood pressure 130/70, pulse 90, weight 144 lb (65.318 kg), SpO2 98.00%.  General: No apparent distress alert and oriented x3 mood and affect normal, dressed appropriately.  HEENT: Pupils equal, extraocular movements intact  Respiratory: Patient's speak in full sentences and does not appear short of breath  Cardiovascular: No lower extremity edema, non tender, no erythema  Skin: Warm dry intact with no signs of infection or rash on extremities or on axial skeleton.  Abdomen: Soft nontender  Neuro: Cranial nerves II through XII  are intact, neurovascularly intact in all extremities with 2+ DTRs and 2+ pulses.  Lymph: No lymphadenopathy of posterior or anterior cervical chain or axillae bilaterally.  Gait normal with good balance and coordination.  MSK:  Non tender with full range of motion and good stability and symmetric strength and tone of shoulders, elbows, wrist, hip, knee and bilaterally.  Ankle: Right No visible erythema or swelling. Range of motion is full in all directions. Strength is 5/5 in all directions. Stable lateral and medial ligaments; squeeze test and kleiger test unremarkable; Talar dome nontender; No pain at base of 5th MT; No tenderness over cuboid; No tenderness over N spot or navicular prominence No tenderness on posterior aspects of lateral and medial malleolus No sign of peroneal tendon subluxations or tenderness to palpation Negative tarsal tunnel tinel's Able to walk 4 steps. Patient is tender to palpation significantly over the medial calcaneal region. Mild tenderness over the Achilles at its insertion.  MSK US performed of: Right ankle This study was ordered, performed, and interpreted by Charlann Boxer D.O.  Foot/Ankle:   All structures visualized.   Talar dome unremarkable with mild osteoarthritic changes Ankle mortise without effusion. Peroneus longus and brevis tendons unremarkable on long and transverse views without sheath effusions. Posterior tibialis, flexor hallucis longus, and flexor digitorum longus tendons unremarkable on long and transverse views without sheath effusions. Achilles tendon visualized along length of tendon and unremarkable on long and transverse views without sheath effusion. Anterior Talofibular Ligament and Calcaneofibular Ligaments unremarkable and intact. Deltoid Ligament unremarkable and intact. Plantar fascia intact and without effusion, normal thickness. No increased doppler signal, cap sign, or thickening of  tibial cortex. Power doppler signal  normal.  IMPRESSION:  Mild Achilles tendinitis with plantar fasciitis and tear     Impression and Recommendations:     This case required medical decision making of moderate complexity.

## 2013-05-20 NOTE — Assessment & Plan Note (Signed)
Discussed different treatment options in great detail. Patient has had chronic edema secondary to oral anti-inflammatories we will try topical. We'll do a heel lift decrease the amount of stress that is on the Achilles. We discussed proper shoe tracing. Patient given home exercises to do a regular basis. We also discussed an icing protocol. Patient will try these interventions and come back again in 3-4 weeks for further evaluation.

## 2013-05-20 NOTE — Patient Instructions (Addendum)
Very nice to meet you Ice bath 20 minutes 1-2 times a day Wear heel lift in shoe.  Good tennis shoes with rigid sole would be better for now.  On step drop hells as far as they can go then up on toes. Hold 2 seconds and down slow for count of 4 seconds.  Repeat 30 times daily.  Look at handout for other exercises to do 3 times a week.  Try pennsaid topically 2 times a day for 1 week then as needed No going bare foot! Come back in 3-4 weeks.

## 2013-06-17 ENCOUNTER — Ambulatory Visit (INDEPENDENT_AMBULATORY_CARE_PROVIDER_SITE_OTHER): Payer: BC Managed Care – PPO | Admitting: Family Medicine

## 2013-06-17 ENCOUNTER — Encounter: Payer: Self-pay | Admitting: Family Medicine

## 2013-06-17 VITALS — BP 110/74 | HR 74 | Ht 65.0 in | Wt 145.0 lb

## 2013-06-17 DIAGNOSIS — M722 Plantar fascial fibromatosis: Secondary | ICD-10-CM

## 2013-06-17 NOTE — Patient Instructions (Signed)
Good to see you Thanks for making me look good Continue exercises 3 times a week for 6 weeks Ice if any pain See me again when you need me.

## 2013-06-17 NOTE — Assessment & Plan Note (Signed)
Patient is doing markedly well at this time. Patient unable to do all activities of daily living and has even started her exercise routine and. Patient encouraged to continue the exercises 3 times a week for the next 6 weeks. We also discussed if any pain symptomatic patient is to start topical anti-inflammatories as well as the icing and. The patient as well she does not need to follow up with me again in will followup more on an as-needed basis. We did discuss progression with increasing resistance with exercises.  Spent greater than 25 minutes with patient face-to-face and had greater than 50% of counseling including as described above in assessment and plan.

## 2013-06-17 NOTE — Progress Notes (Signed)
  [facility] [address] [place], [place] [address] Phone: [phone] Subjective:     CC: Ankle pain right  [id number] Amber Sullivan is a 58 y.o. female coming in with complaint of right ankle pain. Patient was seen previously and did have plantar fasciitis as well as some distal Achilles tendinosis. Patient was given exercises, icing, and topical anti-inflammatories. Patient was to wear he lists as well. Patient states overall she has discontinued all those because she is doing terrific. Patient states that she is 90% better. Nothing is stopping her pain at this time she is actually working out in the gym a regular basis. Patient is back in regular shoes. He continues to do the exercises but no icing. Patient is happy with the result so far.    Past medical history, social, surgical and family history all reviewed in electronic medical record.   Review of Systems: No headache, visual changes, nausea, vomiting, diarrhea, constipation, dizziness, abdominal pain, skin rash, fevers, chills, night sweats, weight loss, swollen lymph nodes, body aches, joint swelling, muscle aches, chest pain, shortness of breath, mood changes.   Objective Blood pressure 110/74, pulse 74, height 5\' 5"  (1.651 m), weight 145 lb (65.772 kg), SpO2 99.00%.  General: No apparent distress alert and oriented x3 mood and affect normal, dressed appropriately.  HEENT: Pupils equal, extraocular movements intact  Respiratory: Patient's speak in full sentences and does not appear short of breath  Cardiovascular: No lower extremity edema, non tender, no erythema  Skin: Warm dry intact with no signs of infection or rash on extremities or on axial skeleton.  Abdomen: Soft nontender  Neuro: Cranial nerves II through XII are intact, neurovascularly intact in all extremities with 2+ DTRs and 2+ pulses.  Lymph: No lymphadenopathy of posterior or anterior cervical chain or axillae bilaterally.    Gait normal with good balance and coordination.  MSK:  Non tender with full range of motion and good stability and symmetric strength and tone of shoulders, elbows, wrist, hip, knee and bilaterally.  Ankle: Right No visible erythema or swelling. Range of motion is full in all directions. Strength is 5/5 in all directions. Stable lateral and medial ligaments; squeeze test and kleiger test unremarkable; Talar dome nontender; No pain at base of 5th MT; No tenderness over cuboid; No tenderness over N spot or navicular prominence No tenderness on posterior aspects of lateral and medial malleolus No sign of peroneal tendon subluxations or tenderness to palpation Negative tarsal tunnel tinel's Able to walk 4 steps. Patient is no longer tender.   Impression and Recommendations:     This case required medical decision making of moderate complexity.

## 2013-07-31 ENCOUNTER — Other Ambulatory Visit: Payer: Self-pay | Admitting: Internal Medicine

## 2013-08-01 ENCOUNTER — Telehealth: Payer: Self-pay | Admitting: Internal Medicine

## 2013-08-01 NOTE — Telephone Encounter (Signed)
rx called into to [facility], [place]; pt aware.

## 2013-08-01 NOTE — Telephone Encounter (Signed)
The patient left her medication at home and she is in [facility] . Needing medication called to a pharmacy. She left a voice mail requesting a call back.

## 2013-08-31 ENCOUNTER — Ambulatory Visit: Payer: BC Managed Care – PPO | Admitting: Internal Medicine

## 2013-09-02 ENCOUNTER — Ambulatory Visit (INDEPENDENT_AMBULATORY_CARE_PROVIDER_SITE_OTHER): Payer: BC Managed Care – PPO | Admitting: Internal Medicine

## 2013-09-02 ENCOUNTER — Encounter: Payer: Self-pay | Admitting: Internal Medicine

## 2013-09-02 VITALS — BP 124/70 | HR 77 | Temp 98.5°F | Resp 16 | Ht 64.0 in | Wt 145.0 lb

## 2013-09-02 DIAGNOSIS — S43422A Sprain of left rotator cuff capsule, initial encounter: Secondary | ICD-10-CM

## 2013-09-02 DIAGNOSIS — Z1239 Encounter for other screening for malignant neoplasm of breast: Secondary | ICD-10-CM

## 2013-09-02 DIAGNOSIS — S43429A Sprain of unspecified rotator cuff capsule, initial encounter: Secondary | ICD-10-CM

## 2013-09-02 NOTE — Progress Notes (Signed)
Patient ID: Amber Sullivan, female   DOB: August 23, 1955, 58 y.o.   MRN: [id number]   Patient Active Problem List   Diagnosis Date Noted  . Rotator cuff (capsule) sprain 09/02/2013  . Plantar fasciitis of right foot 05/20/2013  . Tendonitis, Achilles, right 05/09/2013  . Routine general medical examination at a health care facility 10/13/2012  . Edema 08/30/2012  . History of Mohs surgery for squamous cell carcinoma in situ of skin 01/28/2012  . Screening for colon cancer 01/28/2012  . Hyperlipidemia   . Hypertension     Subjective:  CC:   Chief Complaint  Patient presents with  . left shoulder pain follow up    HPI:   Amber Sullivan is a 58 y.o. female who presents for Left Shoulder pain , has been persistent for several months despite modification of workout.   Has been working out using an exercise band , but not for at least 6 weeks  To 2 months   History of rotator cuff issues years ago  That was managed with reduction in weights and reduction in range of motion during weight lifting.   Hee most recent new activity has been gold.  She has been taking golf lessons once a week, but also does a lot of gardening including  digging holes,  Pulling weeds.   The shoulder pain is aggravated with adduction and  flexion with weight , even light weight . She also has pain across the top of the scapular spine.    Past Medical History  Diagnosis Date  . Hyperlipidemia   . Hypertension     Past Surgical History  Procedure Laterality Date  . Cholecystectomy  2005  . Squamous cell carcinoma excision  2007    mole on face  . Mohs surgery  2007    squamou cell carcinoma rt nasolabial fold       The following portions of the patient's history were reviewed and updated as appropriate: Allergies, current medications, and problem list.    Review of Systems:   Patient denies headache, fevers, malaise, unintentional weight loss, skin rash, eye pain, sinus congestion and sinus pain, sore  throat, dysphagia,  hemoptysis , cough, dyspnea, wheezing, chest pain, palpitations, orthopnea, edema, abdominal pain, nausea, melena, diarrhea, constipation, flank pain, dysuria, hematuria, urinary  Frequency, nocturia, numbness, tingling, seizures,  Focal weakness, Loss of consciousness,  Tremor, insomnia, depression, anxiety, and suicidal ideation.     History   Social History  . Marital Status: Married    Spouse Name: N/A    Number of Children: N/A  . Years of Education: N/A   Occupational History  . Not on file.   Social History Main Topics  . Smoking status: Never Smoker   . Smokeless tobacco: Never Used  . Alcohol Use: Yes  . Drug Use: No  . Sexual Activity: Not on file   Other Topics Concern  . Not on file   Social History Narrative  . No narrative on file    Objective:  Filed Vitals:   09/02/13 0814  BP: 124/70  Pulse: 77  Temp: 98.5 F (36.9 C)  Resp: 16     General appearance: alert, cooperative and appears stated age Back: symmetric, no curvature. ROM normal. No CVA tenderness. Lungs: clear to auscultation bilaterally Heart: regular rate and rhythm, S1, S2 normal, no murmur, click, rub or gallop Abdomen: soft, non-tender; bowel sounds normal; no masses,  no organomegaly Pulses: 2+ and symmetric Skin: Skin color, texture, turgor normal. No  rashes or lesions Lymph nodes: Cervical, supraclavicular, and axillary nodes normal. MSK: left scapular  pain brought on with resisted adduction and resisted flexion   Assessment and Plan:  Rotator cuff (capsule) sprain Suspected by history and exam with prior pain years ago brought on by improper weight lifiting.    current symptoms started after taking up golf. Referral to [facility] .  Advised to refrain from  Golf and tension bands for now until evaluation.  Continue nsaids,  May alternate between heat and ice.     Updated Medication List Outpatient Encounter Prescriptions as of 09/02/2013  Medication Sig  .  aspirin 81 MG tablet Take 81 mg by mouth daily.  . Diclofenac Sodium (PENNSAID) 2 % SOLN Place 2 application onto the skin 2 (two) times daily.  Marland Kitchen estradiol (ESTRACE) 0.1 MG/GM vaginal cream Place 2 g vaginally 2 (two) times a week.  . fish oil-omega-3 fatty acids 1000 MG capsule Take 2 g by mouth daily.  Marland Kitchen losartan (COZAAR) 100 MG tablet TAKE 1 TABLET DAILY  . simvastatin (ZOCOR) 20 MG tablet Take 20 mg by mouth every other day.  . zolpidem (AMBIEN) 10 MG tablet Take 0.5 tablets (5 mg total) by mouth at bedtime as needed.     Orders Placed This Encounter  Procedures  . MM DIGITAL SCREENING BILATERAL  . Ambulatory referral to Sports Medicine    No Follow-up on file.

## 2013-09-02 NOTE — Patient Instructions (Signed)
Rotator Cuff Injury Rotator cuff injury is any type of injury to the set of muscles and tendons that make up the stabilizing unit of your shoulder. This unit holds the ball of your upper arm bone (humerus) in the socket of your shoulder blade (scapula).  CAUSES Injuries to your rotator cuff most commonly come from sports or activities that cause your arm to be moved repeatedly over your head. Examples of this include throwing, weight lifting, swimming, or racquet sports. Long lasting (chronic) irritation of your rotator cuff can cause soreness and swelling (inflammation), bursitis, and eventual damage to your tendons, such as a tear (rupture). SIGNS AND SYMPTOMS Acute rotator cuff tear:  Sudden tearing sensation followed by severe pain shooting from your upper shoulder down your arm toward your elbow.  Decreased range of motion of your shoulder because of pain and muscle spasm.  Severe pain.  Inability to raise your arm out to the side because of pain and loss of muscle power (large tears). Chronic rotator cuff tear:  Pain that usually is worse at night and may interfere with sleep.  Gradual weakness and decreased shoulder motion as the pain worsens.  Decreased range of motion. Rotator cuff tendinitis:  Deep ache in your shoulder and the outside upper arm over your shoulder.  Pain that comes on gradually and becomes worse when lifting your arm to the side or turning it inward. DIAGNOSIS Rotator cuff injury is diagnosed through a medical history, physical exam, and imaging exam. The medical history helps determine the type of rotator cuff injury. Your health care provider will look at your injured shoulder, feel the injured area, and ask you to move your shoulder in different positions. X-ray exams typically are done to rule out other causes of shoulder pain, such as fractures. MRI is the exam of choice for the most severe shoulder injuries because the images show muscles and tendons.    TREATMENT  Chronic tear:  Medicine for pain, such as acetaminophen or ibuprofen.  Physical therapy and range-of-motion exercises may be helpful in maintaining shoulder function and strength.  Steroid injections into your shoulder joint.  Surgical repair of the rotator cuff if the injury does not heal with noninvasive treatment. Acute tear:  Anti-inflammatory medicines such as ibuprofen and naproxen to help reduce pain and swelling.  A sling to help support your arm and rest your rotator cuff muscles. Long-term use of a sling is not advised. It may cause significant stiffening of the shoulder joint.  Surgery may be considered within a few weeks, especially in younger, active people, to return the shoulder to full function.  Indications for surgical treatment include the following:  Age younger than 60 years.  Rotator cuff tears that are complete.  Physical therapy, rest, and anti-inflammatory medicines have been used for 6-8 weeks, with no improvement.  Employment or sporting activity that requires constant shoulder use. Tendinitis:  Anti-inflammatory medicines such as ibuprofen and naproxen to help reduce pain and swelling.  A sling to help support your arm and rest your rotator cuff muscles. Long-term use of a sling is not advised. It may cause significant stiffening of the shoulder joint.  Severe tendinitis may require:  Steroid injections into your shoulder joint.  Physical therapy.  Surgery. HOME CARE INSTRUCTIONS   Apply ice to your injury:  Put ice in a plastic bag.  Place a towel between your skin and the bag.  Leave the ice on for 20 minutes, 2-3 times a day.  If you   have a shoulder immobilizer (sling and straps), wear it until told otherwise by your health care provider.  You may want to sleep on several pillows or in a recliner at night to lessen swelling and pain.  Only take over-the-counter or prescription medicines for pain, discomfort, or fever as  directed by your health care provider.  Do simple hand squeezing exercises with a soft rubber ball to decrease hand swelling. SEEK MEDICAL CARE IF:   Your shoulder pain increases, or new pain or numbness develops in your arm, hand, or fingers.  Your hand or fingers are colder than your other hand. SEEK IMMEDIATE MEDICAL CARE IF:   Your arm, hand, or fingers are numb or tingling.  Your arm, hand, or fingers are increasingly swollen and painful, or they turn white or blue. MAKE SURE YOU:  Understand these instructions.  Will watch your condition.  Will get help right away if you are not doing well or get worse. Document Released: 01/04/2000 Document Revised: 01/11/2013 Document Reviewed: 08/18/2012 Exit[employer] Patient Information 2015 [employer]. This information is not intended to replace advice given to you by your health care provider. Make sure you discuss any questions you have with your health care provider.  

## 2013-09-02 NOTE — Progress Notes (Signed)
Pre visit review using our clinic review tool, if applicable. No additional management support is needed unless otherwise documented below in the visit note. 

## 2013-09-04 ENCOUNTER — Encounter: Payer: Self-pay | Admitting: Internal Medicine

## 2013-09-04 NOTE — Assessment & Plan Note (Signed)
Suspected by history and exam with prior pain years ago brought on by improper weight lifiting.    current symptoms started after taking up golf. Referral to [facility] .  Advised to refrain from  Golf and tension bands for now until evaluation.  Continue nsaids,  May alternate between heat and ice.

## 2013-09-07 ENCOUNTER — Encounter: Payer: Self-pay | Admitting: Family Medicine

## 2013-09-07 ENCOUNTER — Other Ambulatory Visit (INDEPENDENT_AMBULATORY_CARE_PROVIDER_SITE_OTHER): Payer: BC Managed Care – PPO

## 2013-09-07 ENCOUNTER — Ambulatory Visit (INDEPENDENT_AMBULATORY_CARE_PROVIDER_SITE_OTHER): Payer: BC Managed Care – PPO | Admitting: Family Medicine

## 2013-09-07 VITALS — BP 128/80 | HR 82 | Ht 64.0 in | Wt 143.0 lb

## 2013-09-07 DIAGNOSIS — M25519 Pain in unspecified shoulder: Secondary | ICD-10-CM

## 2013-09-07 DIAGNOSIS — M25512 Pain in left shoulder: Secondary | ICD-10-CM

## 2013-09-07 DIAGNOSIS — M7552 Bursitis of left shoulder: Secondary | ICD-10-CM

## 2013-09-07 DIAGNOSIS — M7550 Bursitis of unspecified shoulder: Secondary | ICD-10-CM | POA: Insufficient documentation

## 2013-09-07 DIAGNOSIS — M75100 Unspecified rotator cuff tear or rupture of unspecified shoulder, not specified as traumatic: Secondary | ICD-10-CM

## 2013-09-07 DIAGNOSIS — IMO0002 Reserved for concepts with insufficient information to code with codable children: Secondary | ICD-10-CM

## 2013-09-07 NOTE — Assessment & Plan Note (Signed)
Patient was given an injection today. Patient will do the topical anti-inflammatories for the next 5 days. We discussed a home exercise program and to show proper technique. We discussed lifting mechanics and how this can be avoided. Patient will also do an icing protocol. Patient come back in 3 weeks for further evaluation and treatment.  Spent greater than 25 minutes with patient face-to-face and had greater than 50% of counseling including as described above in assessment and plan.

## 2013-09-07 NOTE — Patient Instructions (Signed)
Good to see you Ice 20 minutes 2 times daily. Usually after activity and before bed. Exercises 3 times a week.  If lifting start at 50% of weight and increase 10% a week.  Pennsaid twice daily for 5 days then as needed Come back in 3 weeks.

## 2013-09-07 NOTE — Progress Notes (Signed)
[facility] [address], [place] [address] Phone: [phone] Subjective:    I'm seeing this patient by the request  of:  TULLO,TERESA, MD   CC: Left shoulder pain  [id number] Amber Sullivan is a 58 y.o. female coming in with complaint of left shoulder pain. Patient states that she's had this pain for several months. Patient has been trying to work out but has had to unfortunately modify her working out because of the pain. Patient does work out regularly as well as does other repetitive motions such as golfing as well as gardening. Patient states lifting her arm above her head or behind her back causes severe pain. Patient states that he can even wake her up at night if she lays on that side. Denies any weakness. Describes mostly as a dull ache. This the severity of 8/10.      Past medical history, social, surgical and family history all reviewed in electronic medical record.   Review of Systems: No headache, visual changes, nausea, vomiting, diarrhea, constipation, dizziness, abdominal pain, skin rash, fevers, chills, night sweats, weight loss, swollen lymph nodes, body aches, joint swelling, muscle aches, chest pain, shortness of breath, mood changes.   Objective Blood pressure 128/80, pulse 82, height 5\' 4"  (1.626 m), weight 143 lb (64.864 kg), SpO2 98.00%.  General: No apparent distress alert and oriented x3 mood and affect normal, dressed appropriately.  HEENT: Pupils equal, extraocular movements intact  Respiratory: Patient's speak in full sentences and does not appear short of breath  Cardiovascular: No lower extremity edema, non tender, no erythema  Skin: Warm dry intact with no signs of infection or rash on extremities or on axial skeleton.  Abdomen: Soft nontender  Neuro: Cranial nerves II through XII are intact, neurovascularly intact in all extremities with 2+ DTRs and 2+ pulses.  Lymph: No lymphadenopathy of posterior or anterior  cervical chain or axillae bilaterally.  Gait normal with good balance and coordination.  MSK:  Non tender with full range of motion and good stability and symmetric strength and tone of  elbows, wrist, hip, knee and ankles bilaterally.  Shoulder: left Inspection reveals no abnormalities, atrophy or asymmetry. Palpation is normal with no tenderness over AC joint or bicipital groove. ROM is full in all planes passively. Rotator cuff strength normal throughout. signs of impingement with positive Neer and Hawkin's tests, but negative empty can sign. Speeds and Yergason's tests normal. No labral pathology noted with negative Obrien's, negative clunk and good stability. Normal scapular function observed. No painful arc and no drop arm sign. No apprehension sign  MSK US performed of: left This study was ordered, performed, and interpreted by Charlann Boxer D.O.  Shoulder:   Supraspinatus:  Appears normal on long and transverse views, Bursal bulge seen with shoulder abduction on impingement view. Infraspinatus:  Appears normal on long and transverse views. Significant increase in Doppler flow Subscapularis:  Appears normal on long and transverse views. Positive bursa Teres Minor:  Appears normal on long and transverse views. AC joint:  Capsule undistended, no geyser sign. Glenohumeral Joint:  Appears normal without effusion. Glenoid Labrum:  Intact without visualized tears. Biceps Tendon:  Appears normal on long and transverse views, no fraying of tendon, tendon located in intertubercular groove, no subluxation with shoulder internal or external rotation.  Impression: Subacromial bursitis  Procedure: Real-time Ultrasound Guided Injection of left glenohumeral joint Device: GE Logiq E  Ultrasound guided injection is preferred based studies that show increased duration, increased  effect, greater accuracy, decreased procedural pain, increased response rate with ultrasound guided versus blind  injection.  Verbal informed consent obtained.  Time-out conducted.  Noted no overlying erythema, induration, or other signs of local infection.  Skin prepped in a sterile fashion.  Local anesthesia: Topical Ethyl chloride.  With sterile technique and under real time ultrasound guidance:  Joint visualized.  23g 1  inch needle inserted posterior approach. Pictures taken for needle placement. Patient did have injection of 2 cc of 1% lidocaine, 2 cc of 0.5% Marcaine, and 1.0 cc of Kenalog 40 mg/dL. Completed without difficulty  Pain immediately resolved suggesting accurate placement of the medication.  Advised to call if fevers/chills, erythema, induration, drainage, or persistent bleeding.  Images permanently stored and available for review in the ultrasound unit.  Impression: Technically successful ultrasound guided injection.     Impression and Recommendations:     This case required medical decision making of moderate complexity.

## 2013-09-09 ENCOUNTER — Ambulatory Visit: Payer: BC Managed Care – PPO | Admitting: Family Medicine

## 2013-09-14 LAB — HM MAMMOGRAPHY

## 2013-09-16 ENCOUNTER — Other Ambulatory Visit: Payer: Self-pay | Admitting: Internal Medicine

## 2013-09-21 ENCOUNTER — Telehealth: Payer: Self-pay | Admitting: Internal Medicine

## 2013-09-21 NOTE — Telephone Encounter (Signed)
Patient called for refill on Ambien last script written 4/15  Please advise ok to fill?

## 2013-09-22 MED ORDER — ZOLPIDEM TARTRATE 10 MG PO TABS: 0.5000 mg | ORAL_TABLET | Freq: Every evening | ORAL | Status: DC | PRN

## 2013-09-22 NOTE — Telephone Encounter (Signed)
Ok to refill,  printed rx  

## 2013-09-27 NOTE — Telephone Encounter (Signed)
Rx faxed to pharmacy  

## 2013-09-28 ENCOUNTER — Encounter: Payer: Self-pay | Admitting: Family Medicine

## 2013-09-28 ENCOUNTER — Ambulatory Visit (INDEPENDENT_AMBULATORY_CARE_PROVIDER_SITE_OTHER): Payer: BC Managed Care – PPO | Admitting: Family Medicine

## 2013-09-28 VITALS — BP 132/72 | HR 95 | Ht 64.0 in | Wt 142.0 lb

## 2013-09-28 DIAGNOSIS — IMO0002 Reserved for concepts with insufficient information to code with codable children: Secondary | ICD-10-CM

## 2013-09-28 DIAGNOSIS — M7552 Bursitis of left shoulder: Secondary | ICD-10-CM

## 2013-09-28 DIAGNOSIS — M75100 Unspecified rotator cuff tear or rupture of unspecified shoulder, not specified as traumatic: Secondary | ICD-10-CM

## 2013-09-28 NOTE — Patient Instructions (Signed)
It is good to see you.  Ice is your friend when you need it.  Heel butt shoulder and head touching for goal 5 minutes.  Sitting in chairs put tennisball between shoulder blades, can duct tape it to the chair if you want.  Monitor at eye level and keyboard with elbows at 90 degrees Back to the gym but 30% of what you were doing and increase 10% a week.  Hands in peripheral vision at all times.  Continue my exercises 3 times a week for another 4 weeks.  If not perfect come back in 4-6 weeks.

## 2013-09-28 NOTE — Assessment & Plan Note (Signed)
Patient is doing significantly better after last injection. Patient given days to exercises and strengthening exercises. Patient was given exercises prescription this case seen the frequency duration as well as how to increase her weight slowly over the course of multiple weeks. Patient will avoid golf until next season. We discussed postural changes at work as well as standing postural exercises I think would be beneficial. Patient will try these different changes and come back in 4-6 for further evaluation. All technique in proper movements were shown by myself to patient today.  Spent greater than 25 minutes with patient face-to-face and had greater than 50% of counseling including as described above in assessment and plan.

## 2013-09-28 NOTE — Progress Notes (Signed)
  [facility] [address], [place] [address] Phone: [phone] Subjective:     CC: Left shoulder pain follow up  [id number] Baylyn Sickles is a 58 y.o. female coming in with complaint of left shoulder pain. Patient was seen previously and was diagnosed with a subacromial bursitis. Patient was given a cortisone injection under ultrasound guidance at last visit. Patient was given home exercise program as well as topical anti-inflammatories and an icing regimen. Patient states she is approximately 75% better after the injection. Patient has been doing the exercises regularly. Patient has been nervous to get back to the weight room. Other than that she states that she's doing very well and only has some discomfort when she puts pressure on her elbow leaning on that side. Denies any new symptoms. Denies any nighttime awakening. Not taking any medication for the pain.     Past medical history, social, surgical and family history all reviewed in electronic medical record.   Review of Systems: No headache, visual changes, nausea, vomiting, diarrhea, constipation, dizziness, abdominal pain, skin rash, fevers, chills, night sweats, weight loss, swollen lymph nodes, body aches, joint swelling, muscle aches, chest pain, shortness of breath, mood changes.   Objective Blood pressure 132/72, pulse 95, height 5\' 4"  (1.626 m), weight 142 lb (64.411 kg), SpO2 95.00%.  General: No apparent distress alert and oriented x3 mood and affect normal, dressed appropriately.  HEENT: Pupils equal, extraocular movements intact  Respiratory: Patient's speak in full sentences and does not appear short of breath  Cardiovascular: No lower extremity edema, non tender, no erythema  Skin: Warm dry intact with no signs of infection or rash on extremities or on axial skeleton.  Abdomen: Soft nontender  Neuro: Cranial nerves II through XII are intact, neurovascularly intact in all  extremities with 2+ DTRs and 2+ pulses.  Lymph: No lymphadenopathy of posterior or anterior cervical chain or axillae bilaterally.  Gait normal with good balance and coordination.  MSK:  Non tender with full range of motion and good stability and symmetric strength and tone of  elbows, wrist, hip, knee and ankles bilaterally.  Shoulder: left Inspection reveals no abnormalities, atrophy or asymmetry. Palpation is normal with no tenderness over AC joint or bicipital groove. ROM is full in all planes passively. Rotator cuff strength normal throughout. signs of impingement with positive Neer and Hawkin's tests, but negative empty can sign. This is significantly less tender though than previous exam. Speeds and Yergason's tests normal. No labral pathology noted with negative Obrien's, negative clunk and good stability. Normal scapular function observed. No painful arc and no drop arm sign. No apprehension sign Lateral shoulder unremarkable      Impression and Recommendations:     This case required medical decision making of moderate complexity.

## 2013-11-27 ENCOUNTER — Other Ambulatory Visit: Payer: Self-pay | Admitting: Internal Medicine

## 2013-11-30 ENCOUNTER — Encounter: Payer: Self-pay | Admitting: Internal Medicine

## 2013-12-02 ENCOUNTER — Telehealth: Payer: Self-pay

## 2013-12-02 MED ORDER — ESTRADIOL 0.1 MG/GM VA CREA: 2.0000 g | TOPICAL_CREAM | VAGINAL | Status: DC

## 2013-12-02 NOTE — Telephone Encounter (Signed)
Refill sent.

## 2013-12-02 NOTE — Telephone Encounter (Signed)
The pt was seeing [facility] for her obgyn (in [place]) and she was prescribed Estrace.  Her OB has retired and she is hoping that she can have this refilled until her cpe with Dr.Tullo in Jan.   Pharmacy - [facility]  Callback [phone]

## 2014-01-30 ENCOUNTER — Encounter: Payer: Self-pay | Admitting: *Deleted

## 2014-01-30 ENCOUNTER — Encounter: Payer: BC Managed Care – PPO | Admitting: Internal Medicine

## 2014-01-30 ENCOUNTER — Other Ambulatory Visit (INDEPENDENT_AMBULATORY_CARE_PROVIDER_SITE_OTHER): Payer: BLUE CROSS/BLUE SHIELD

## 2014-01-30 ENCOUNTER — Telehealth: Payer: Self-pay | Admitting: *Deleted

## 2014-01-30 DIAGNOSIS — Z79899 Other long term (current) drug therapy: Secondary | ICD-10-CM | POA: Insufficient documentation

## 2014-01-30 DIAGNOSIS — E785 Hyperlipidemia, unspecified: Secondary | ICD-10-CM

## 2014-01-30 LAB — LIPID PANEL
Cholesterol: 197 mg/dL (ref 0–200)
HDL: 58.5 mg/dL (ref >39.00)
LDL Cholesterol: 123 mg/dL — ABNORMAL HIGH (ref 0–99)
NonHDL: 138.5
Total CHOL/HDL Ratio: 3
Triglycerides: 77 mg/dL (ref 0.0–149.0)
VLDL: 15.4 mg/dL (ref 0.0–40.0)

## 2014-01-30 LAB — COMPREHENSIVE METABOLIC PANEL
ALT: 17 U/L (ref 0–35)
AST: 17 U/L (ref 0–37)
Albumin: 4 g/dL (ref 3.5–5.2)
Alkaline Phosphatase: 61 U/L (ref 39–117)
BUN: 12 mg/dL (ref 6–23)
CO2: 27 mEq/L (ref 19–32)
Calcium: 8.9 mg/dL (ref 8.4–10.5)
Chloride: 104 mEq/L (ref 96–112)
Creatinine, Ser: 0.7 mg/dL (ref 0.4–1.2)
GFR: 96.99 mL/min (ref >60.00)
Glucose, Bld: 89 mg/dL (ref 70–99)
Potassium: 4.5 mEq/L (ref 3.5–5.1)
Sodium: 139 mEq/L (ref 135–145)
Total Bilirubin: 0.9 mg/dL (ref 0.2–1.2)
Total Protein: 6.6 g/dL (ref 6.0–8.3)

## 2014-01-30 NOTE — Telephone Encounter (Signed)
Labs are in

## 2014-01-30 NOTE — Telephone Encounter (Signed)
What labs and dx?  

## 2014-02-07 ENCOUNTER — Other Ambulatory Visit: Payer: Self-pay | Admitting: Internal Medicine

## 2014-02-15 ENCOUNTER — Encounter: Payer: Self-pay | Admitting: Nurse Practitioner

## 2014-02-15 ENCOUNTER — Ambulatory Visit (INDEPENDENT_AMBULATORY_CARE_PROVIDER_SITE_OTHER): Payer: BLUE CROSS/BLUE SHIELD | Admitting: Nurse Practitioner

## 2014-02-15 VITALS — BP 110/64 | HR 82 | Temp 97.5°F | Resp 14 | Ht 64.0 in | Wt 146.8 lb

## 2014-02-15 DIAGNOSIS — R059 Cough, unspecified: Secondary | ICD-10-CM

## 2014-02-15 DIAGNOSIS — R05 Cough: Secondary | ICD-10-CM

## 2014-02-15 NOTE — Progress Notes (Signed)
Pre visit review using our clinic review tool, if applicable. No additional management support is needed unless otherwise documented below in the visit note. 

## 2014-02-15 NOTE — Progress Notes (Signed)
Subjective:    Patient ID: Amber Sullivan, female    DOB: September 09, 1955, 59 y.o.   MRN: [id number]  HPI  Amber Sullivan is a 59 yo female with a CC of nasal congestion and cough x 3 weeks with wheezing.   1) Tightness, wheezing, cogesting, post-nasal drip, lived with a smoker for 15 years, 2 people in contact with her have had walking pneumonia. Clear colored sputum. Chest is worse than nasal congestion.   Coricidin HBP- Helpful  Aspirin- 325 mg  Benadryl- Not helpful    Review of Systems  Constitutional: Negative for fever, chills, diaphoresis and fatigue.  HENT: Positive for postnasal drip. Negative for congestion, ear discharge, ear pain, rhinorrhea, sinus pressure, sneezing and sore throat.   Eyes: Negative for visual disturbance.  Respiratory: Positive for cough, chest tightness and wheezing. Negative for shortness of breath.   Cardiovascular: Negative for chest pain, palpitations and leg swelling.  Gastrointestinal: Negative for nausea, vomiting and diarrhea.  Musculoskeletal: Negative for myalgias.  Skin: Negative for rash.   Past Medical History  Diagnosis Date  . Hyperlipidemia   . Hypertension     History   Social History  . Marital Status: Married    Spouse Name: N/A    Number of Children: N/A  . Years of Education: N/A   Occupational History  . Not on file.   Social History Main Topics  . Smoking status: Never Smoker   . Smokeless tobacco: Never Used  . Alcohol Use: Yes  . Drug Use: No  . Sexual Activity: Not on file   Other Topics Concern  . Not on file   Social History Narrative    Past Surgical History  Procedure Laterality Date  . Cholecystectomy  2005  . Squamous cell carcinoma excision  2007    mole on face  . Mohs surgery  2007    squamou cell carcinoma rt nasolabial fold    Family History  Problem Relation Age of Onset  . Hypertension Mother   . Diabetes Mother     type 2  . Stroke Mother 69    massive, with aphasia and paraplegia   . Hypertension Father   . Hyperlipidemia Father   . Heart attack Father     vs PE during hospitalization for chest pain   . Heart disease Father   . Heart attack Maternal Grandfather   . Heart disease Paternal Grandmother     CHF  . Heart disease Paternal Grandfather   . Heart attack Paternal Grandfather     Allergies  Allergen Reactions  . Ace Inhibitors Cough    Current Outpatient Prescriptions on File Prior to Visit  Medication Sig Dispense Refill  . aspirin 81 MG tablet Take 81 mg by mouth daily.    . Diclofenac Sodium (PENNSAID) 2 % SOLN Place 2 application onto the skin 2 (two) times daily. 112 g 3  . ESTRACE VAGINAL 0.1 MG/GM vaginal cream USE 0.86 APPLICATORFUL VAGINALLY TWICE WEEKLY 42.5 g 2  . fish oil-omega-3 fatty acids 1000 MG capsule Take 2 g by mouth daily.    Marland Kitchen losartan (COZAAR) 100 MG tablet TAKE 1 TABLET DAILY 90 tablet 1  . simvastatin (ZOCOR) 20 MG tablet Take 20 mg by mouth every other day.    . simvastatin (ZOCOR) 20 MG tablet TAKE 1 TABLET AT BEDTIME 90 tablet 0  . zolpidem (AMBIEN) 10 MG tablet Take 0.5 tablets (5 mg total) by mouth at bedtime as needed. 30 tablet 5  No current facility-administered medications on file prior to visit.      Objective:   Physical Exam  Constitutional: She is oriented to person, place, and time. She appears well-developed and well-nourished. No distress.  BP 110/64 mmHg  Pulse 82  Temp(Src) 97.5 F (36.4 C) (Oral)  Resp 14  Ht 5\' 4"  (1.626 m)  Wt 146 lb 12.8 oz (66.588 kg)  BMI 25.19 kg/m2  SpO2 97%   HENT:  Head: Normocephalic and atraumatic.  Right Ear: External ear normal.  Left Ear: External ear normal.  Cardiovascular: Normal rate, regular rhythm, normal heart sounds and intact distal pulses.  Exam reveals no gallop and no friction rub.   No murmur heard. Pulmonary/Chest: Effort normal and breath sounds normal. No respiratory distress. She has no wheezes. She has no rales. She exhibits no tenderness.    Neurological: She is alert and oriented to person, place, and time. No cranial nerve deficit. She exhibits normal muscle tone. Coordination normal.  Skin: Skin is warm and dry. No rash noted. She is not diaphoretic.  Psychiatric: She has a normal mood and affect. Her behavior is normal. Judgment and thought content normal.      Assessment & Plan:

## 2014-02-15 NOTE — Progress Notes (Signed)
   Subjective:    Patient ID: Amber Sullivan, female    DOB: 05/07/1955, 59 y.o.   MRN: [id number]  HPI    Review of Systems     Objective:   Physical Exam        Assessment & Plan:

## 2014-02-15 NOTE — Patient Instructions (Addendum)
We will follow up after results from X-ray.

## 2014-02-16 ENCOUNTER — Ambulatory Visit: Payer: Self-pay | Admitting: Nurse Practitioner

## 2014-02-17 DIAGNOSIS — R059 Cough, unspecified: Secondary | ICD-10-CM | POA: Insufficient documentation

## 2014-02-17 DIAGNOSIS — R05 Cough: Secondary | ICD-10-CM | POA: Insufficient documentation

## 2014-02-17 NOTE — Assessment & Plan Note (Signed)
Obtain chest x-ray. FU after chest x-ray. Continue OTC measures until results.

## 2014-02-20 ENCOUNTER — Telehealth: Payer: Self-pay | Admitting: Internal Medicine

## 2014-02-20 NOTE — Telephone Encounter (Signed)
Can you request from [facility] for Chest X-ray. Thanks!

## 2014-02-20 NOTE — Telephone Encounter (Signed)
Patient called back asking about results of Chest X-Ray left message on voicemail called patient and advised of message that request has been sent to the [facility] facility for copy of X-ray.

## 2014-02-20 NOTE — Telephone Encounter (Signed)
Patient called for results of X-Ray ordered during visit on 02/15/14 Please advise.

## 2014-02-20 NOTE — Telephone Encounter (Signed)
Chest X-ray requested.

## 2014-02-23 ENCOUNTER — Telehealth: Payer: Self-pay | Admitting: Nurse Practitioner

## 2014-02-23 NOTE — Telephone Encounter (Signed)
Called and talked to Amber Sullivan personally. She is having a chronic cough and chest tightness. The Chest X-ray results came 02/23/14 after multiple calls over the past 3 days for the x-ray. Report states negative for significant findings. She is aware and verbalized understanding. She wants to know what to do next and will discuss this with Dr. Derrel Nip at their visit on 03/06/14.

## 2014-02-23 NOTE — Telephone Encounter (Signed)
Called [facility] and talked with her about the negative findings of her Chest x-ray. Will discuss with Dr. Derrel Nip on 03/06/14. Verbalized understanding.

## 2014-03-03 ENCOUNTER — Encounter: Payer: Self-pay | Admitting: Family Medicine

## 2014-03-03 ENCOUNTER — Ambulatory Visit (INDEPENDENT_AMBULATORY_CARE_PROVIDER_SITE_OTHER): Payer: BLUE CROSS/BLUE SHIELD | Admitting: Family Medicine

## 2014-03-03 ENCOUNTER — Other Ambulatory Visit (INDEPENDENT_AMBULATORY_CARE_PROVIDER_SITE_OTHER): Payer: BLUE CROSS/BLUE SHIELD

## 2014-03-03 VITALS — BP 122/76 | HR 98 | Ht 64.0 in | Wt 144.0 lb

## 2014-03-03 DIAGNOSIS — M25512 Pain in left shoulder: Secondary | ICD-10-CM | POA: Diagnosis not present

## 2014-03-03 DIAGNOSIS — M7552 Bursitis of left shoulder: Secondary | ICD-10-CM

## 2014-03-03 NOTE — Assessment & Plan Note (Addendum)
Injected again today.  Discussed icing regimen, discussed topical anti-inflammatories, and patient will be referred to formal physical therapy which I think will be beneficial. Patient will come back and see me again in 4 weeks for further evaluation and treatment.

## 2014-03-03 NOTE — Patient Instructions (Signed)
Good to see you Ice would be helpful COntinue the exercises Continue the Pennsaid PT will be calling you See me again  In 4-6 weeks.

## 2014-03-03 NOTE — Progress Notes (Signed)
Pre visit review using our clinic review tool, if applicable. No additional management support is needed unless otherwise documented below in the visit note. 

## 2014-03-03 NOTE — Progress Notes (Signed)
[facility] [address] [place], [place] [address] Phone: [phone] Subjective:     CC: Left shoulder pain follow up  BOF:BPZWCHENID Amber Sullivan is a 59 y.o. female coming in with complaint of left shoulder pain. Patient was seen previously and was diagnosed with a subacromial bursitis. Patient was last seen in September and was given an injection. This is greater than 5 months ago. Patient was doing a proximally 75% better at last follow-up in September as well. Patient states unfortunate some of the pain is starting to come back. Still not as bad as it was prior to the first injection. Still gives her some discomfort especially when reaching across her body, gravida seatbelt, diarrhea and if she sleeps on that side. Patient states when she does certain activities such as above her head also gives her some discomfort. Not stopping her from any daily activities. Patient continues a home exercises fairly regularly she states. Patient is not icing and is only doing the topical anti-inflammatory intermittently.    Past medical history, social, surgical and family history all reviewed in electronic medical record.   Review of Systems: No headache, visual changes, nausea, vomiting, diarrhea, constipation, dizziness, abdominal pain, skin rash, fevers, chills, night sweats, weight loss, swollen lymph nodes, body aches, joint swelling, muscle aches, chest pain, shortness of breath, mood changes.   Objective Blood pressure 122/76, pulse 98, height 5\' 4"  (1.626 m), weight 144 lb (65.318 kg), SpO2 97 %.  General: No apparent distress alert and oriented x3 mood and affect normal, dressed appropriately.  HEENT: Pupils equal, extraocular movements intact  Respiratory: Patient's speak in full sentences and does not appear short of breath  Cardiovascular: No lower extremity edema, non tender, no erythema  Skin: Warm dry intact with no signs of infection or rash on  extremities or on axial skeleton.  Abdomen: Soft nontender  Neuro: Cranial nerves II through XII are intact, neurovascularly intact in all extremities with 2+ DTRs and 2+ pulses.  Lymph: No lymphadenopathy of posterior or anterior cervical chain or axillae bilaterally.  Gait normal with good balance and coordination.  MSK:  Non tender with full range of motion and good stability and symmetric strength and tone of  elbows, wrist, hip, knee and ankles bilaterally.  Shoulder: left Inspection reveals no abnormalities, atrophy or asymmetry. Palpation is normal with no tenderness over AC joint or bicipital groove. ROM is full in all planes passively. Rotator cuff strength normal throughout. signs of impingement with positive Neer and Hawkin's tests, but negative empty can sign.  Speeds and Yergason's tests normal. No labral pathology noted with negative Obrien's, negative clunk and good stability. Normal scapular function observed. No painful arc and no drop arm sign. No apprehension sign Lateral shoulder unremarkable  Procedure: Real-time Ultrasound Guided Injection of left glenohumeral joint Device: GE Logiq E  Ultrasound guided injection is preferred based studies that show increased duration, increased effect, greater accuracy, decreased procedural pain, increased response rate with ultrasound guided versus blind injection.  Verbal informed consent obtained.  Time-out conducted.  Noted no overlying erythema, induration, or other signs of local infection.  Skin prepped in a sterile fashion.  Local anesthesia: Topical Ethyl chloride.  With sterile technique and under real time ultrasound guidance:  Joint visualized.  23g 1  inch needle inserted posterior approach. Pictures taken for needle placement. Patient did have injection of 2 cc of 1% lidocaine, 2 cc of 0.5% Marcaine, and 1cc of Kenalog 40 mg/dL.  Completed without difficulty  Pain immediately resolved suggesting accurate placement of  the medication.  Advised to call if fevers/chills, erythema, induration, drainage, or persistent bleeding.  Images permanently stored and available for review in the ultrasound unit.  Impression: Technically successful ultrasound guided injection.     Impression and Recommendations:     This case required medical decision making of moderate complexity.

## 2014-03-06 ENCOUNTER — Encounter: Payer: BLUE CROSS/BLUE SHIELD | Admitting: Internal Medicine

## 2014-03-30 ENCOUNTER — Encounter: Payer: Self-pay | Admitting: Internal Medicine

## 2014-03-30 ENCOUNTER — Ambulatory Visit (INDEPENDENT_AMBULATORY_CARE_PROVIDER_SITE_OTHER): Payer: BLUE CROSS/BLUE SHIELD | Admitting: Internal Medicine

## 2014-03-30 ENCOUNTER — Other Ambulatory Visit (HOSPITAL_COMMUNITY)
Admission: RE | Admit: 2014-03-30 | Discharge: 2014-03-30 | Disposition: A | Discharge status: Home / Self Care | Payer: BLUE CROSS/BLUE SHIELD | Source: Ambulatory Visit | Attending: Internal Medicine | Admitting: Internal Medicine

## 2014-03-30 VITALS — BP 104/62 | HR 94 | Temp 97.7°F | Resp 14 | Ht 66.0 in | Wt 143.5 lb

## 2014-03-30 DIAGNOSIS — I10 Essential (primary) hypertension: Secondary | ICD-10-CM

## 2014-03-30 DIAGNOSIS — Z0000 Encounter for general adult medical examination without abnormal findings: Secondary | ICD-10-CM

## 2014-03-30 DIAGNOSIS — Z13820 Encounter for screening for osteoporosis: Secondary | ICD-10-CM

## 2014-03-30 DIAGNOSIS — Z1151 Encounter for screening for human papillomavirus (HPV): Secondary | ICD-10-CM | POA: Diagnosis present

## 2014-03-30 DIAGNOSIS — Z01419 Encounter for gynecological examination (general) (routine) without abnormal findings: Secondary | ICD-10-CM | POA: Insufficient documentation

## 2014-03-30 DIAGNOSIS — Z1211 Encounter for screening for malignant neoplasm of colon: Secondary | ICD-10-CM

## 2014-03-30 DIAGNOSIS — Z8742 Personal history of other diseases of the female genital tract: Secondary | ICD-10-CM

## 2014-03-30 DIAGNOSIS — E785 Hyperlipidemia, unspecified: Secondary | ICD-10-CM

## 2014-03-30 MED ORDER — ZOSTER VACCINE LIVE 19400 UNT/0.65ML ~~LOC~~ SOLR: 0.6500 mL | Freq: Once | SUBCUTANEOUS | Status: DC

## 2014-03-30 NOTE — Patient Instructions (Signed)
We will set your mammogram up at [facility] in august and your DEXA scan at [facility]   Referral for colonoscopy to Dr Charlynne Pander is in process  Shingles vaccine  Is recommended   I recommend getting the majority of your calcium and Vitamin D  through diet rather than supplements given the recent association of calcium supplements with increased coronary artery calcium scores (You need 1200 mg daily )   Unsweetened almond/coconut milk is a great low calorie low carb, cholesterol free  way to increase your dietary calcium and vitamin D.  Try the blue [facility] Maintenance Adopting a healthy lifestyle and getting preventive care can go a long way to promote health and wellness. Talk with your health care provider about what schedule of regular examinations is right for you. This is a good chance for you to check in with your provider about disease prevention and staying healthy. In between checkups, there are plenty of things you can do on your own. Experts have done a lot of research about which lifestyle changes and preventive measures are most likely to keep you healthy. Ask your health care provider for more information. WEIGHT AND DIET  Eat a healthy diet  Be sure to include plenty of vegetables, fruits, low-fat dairy products, and lean protein.  Do not eat a lot of foods high in solid fats, added sugars, or salt.  Get regular exercise. This is one of the most important things you can do for your health.  Most adults should exercise for at least 150 minutes each week. The exercise should increase your heart rate and make you sweat (moderate-intensity exercise).  Most adults should also do strengthening exercises at least twice a week. This is in addition to the moderate-intensity exercise.  Maintain a healthy weight  Body mass index (BMI) is a measurement that can be used to identify possible weight problems. It estimates body fat based on height and weight. Your health  care provider can help determine your BMI and help you achieve or maintain a healthy weight.  For females 21 years of age and older:   A BMI below 18.5 is considered underweight.  A BMI of 18.5 to 24.9 is normal.  A BMI of 25 to 29.9 is considered overweight.  A BMI of 30 and above is considered obese.  Watch levels of cholesterol and blood lipids  You should start having your blood tested for lipids and cholesterol at 61 years of age, then have this test every 5 years.  You may need to have your cholesterol levels checked more often if:  Your lipid or cholesterol levels are high.  You are older than 61 years of age.  You are at high risk for heart disease.  CANCER SCREENING   Lung Cancer  Lung cancer screening is recommended for adults 31-61 years old who are at high risk for lung cancer because of a history of smoking.  A yearly low-dose CT scan of the lungs is recommended for people who:  Currently smoke.  Have quit within the past 15 years.  Have at least a 30-pack-year history of smoking. A pack year is smoking an average of one pack of cigarettes a day for 1 year.  Yearly screening should continue until it has been 15 years since you quit.  Yearly screening should stop if you develop a health problem that would prevent you from having lung cancer treatment.  Breast Cancer  Practice breast self-awareness. This means  understanding how your breasts normally appear and feel.  It also means doing regular breast self-exams. Let your health care provider know about any changes, no matter how small.  If you are in your 20s or 30s, you should have a clinical breast exam (CBE) by a health care provider every 1-3 years as part of a regular health exam.  If you are 60 or older, have a CBE every year. Also consider having a breast X-ray (mammogram) every year.  If you have a family history of breast cancer, talk to your health care provider about genetic  screening.  If you are at high risk for breast cancer, talk to your health care provider about having an MRI and a mammogram every year.  Breast cancer gene (BRCA) assessment is recommended for women who have family members with BRCA-related cancers. BRCA-related cancers include:  Breast.  Ovarian.  Tubal.  Peritoneal cancers.  Results of the assessment will determine the need for genetic counseling and BRCA1 and BRCA2 testing. Cervical Cancer Routine pelvic examinations to screen for cervical cancer are no longer recommended for nonpregnant women who are considered low risk for cancer of the pelvic organs (ovaries, uterus, and vagina) and who do not have symptoms. A pelvic examination may be necessary if you have symptoms including those associated with pelvic infections. Ask your health care provider if a screening pelvic exam is right for you.   The Pap test is the screening test for cervical cancer for women who are considered at risk.  If you had a hysterectomy for a problem that was not cancer or a condition that could lead to cancer, then you no longer need Pap tests.  If you are older than 65 years, and you have had normal Pap tests for the past 10 years, you no longer need to have Pap tests.  If you have had past treatment for cervical cancer or a condition that could lead to cancer, you need Pap tests and screening for cancer for at least 20 years after your treatment.  If you no longer get a Pap test, assess your risk factors if they change (such as having a new sexual partner). This can affect whether you should start being screened again.  Some women have medical problems that increase their chance of getting cervical cancer. If this is the case for you, your health care provider may recommend more frequent screening and Pap tests.  The human papillomavirus (HPV) test is another test that may be used for cervical cancer screening. The HPV test looks for the virus that can  cause cell changes in the cervix. The cells collected during the Pap test can be tested for HPV.  The HPV test can be used to screen women 16 years of age and older. Getting tested for HPV can extend the interval between normal Pap tests from three to five years.  An HPV test also should be used to screen women of any age who have unclear Pap test results.  After 61 years of age, women should have HPV testing as often as Pap tests.  Colorectal Cancer  This type of cancer can be detected and often prevented.  Routine colorectal cancer screening usually begins at 61 years of age and continues through 61 years of age.  Your health care provider may recommend screening at an earlier age if you have risk factors for colon cancer.  Your health care provider may also recommend using home test kits to check for hidden blood  in the stool.  A small camera at the end of a tube can be used to examine your colon directly (sigmoidoscopy or colonoscopy). This is done to check for the earliest forms of colorectal cancer.  Routine screening usually begins at age 59.  Direct examination of the colon should be repeated every 5-10 years through 61 years of age. However, you may need to be screened more often if early forms of precancerous polyps or small growths are found. Skin Cancer  Check your skin from head to toe regularly.  Tell your health care provider about any new moles or changes in moles, especially if there is a change in a mole's shape or color.  Also tell your health care provider if you have a mole that is larger than the size of a pencil eraser.  Always use sunscreen. Apply sunscreen liberally and repeatedly throughout the day.  Protect yourself by wearing long sleeves, pants, a wide-brimmed hat, and sunglasses whenever you are outside. HEART DISEASE, DIABETES, AND HIGH BLOOD PRESSURE   Have your blood pressure checked at least every 1-2 years. High blood pressure causes heart  disease and increases the risk of stroke.  If you are between 73 years and 26 years old, ask your health care provider if you should take aspirin to prevent strokes.  Have regular diabetes screenings. This involves taking a blood sample to check your fasting blood sugar level.  If you are at a normal weight and have a low risk for diabetes, have this test once every three years after 61 years of age.  If you are overweight and have a high risk for diabetes, consider being tested at a younger age or more often. PREVENTING INFECTION  Hepatitis B  If you have a higher risk for hepatitis B, you should be screened for this virus. You are considered at high risk for hepatitis B if:  You were born in a country where hepatitis B is common. Ask your health care provider which countries are considered high risk.  Your parents were born in a high-risk country, and you have not been immunized against hepatitis B (hepatitis B vaccine).  You have HIV or AIDS.  You use needles to inject street drugs.  You live with someone who has hepatitis B.  You have had sex with someone who has hepatitis B.  You get hemodialysis treatment.  You take certain medicines for conditions, including cancer, organ transplantation, and autoimmune conditions. Hepatitis C  Blood testing is recommended for:  Everyone born from [birth year] through 1967.  Anyone with known risk factors for hepatitis C. Sexually transmitted infections (STIs)  You should be screened for sexually transmitted infections (STIs) including gonorrhea and chlamydia if:  You are sexually active and are younger than 61 years of age.  You are older than 62 years of age and your health care provider tells you that you are at risk for this type of infection.  Your sexual activity has changed since you were last screened and you are at an increased risk for chlamydia or gonorrhea. Ask your health care provider if you are at risk.  If you do not have  HIV, but are at risk, it may be recommended that you take a prescription medicine daily to prevent HIV infection. This is called pre-exposure prophylaxis (PrEP). You are considered at risk if:  You are sexually active and do not regularly use condoms or know the HIV status of your partner(s).  You take drugs by injection.  You are sexually active with a partner who has HIV. Talk with your health care provider about whether you are at high risk of being infected with HIV. If you choose to begin PrEP, you should first be tested for HIV. You should then be tested every 3 months for as long as you are taking PrEP.  PREGNANCY   If you are premenopausal and you may become pregnant, ask your health care provider about preconception counseling.  If you may become pregnant, take 400 to 800 micrograms (mcg) of folic acid every day.  If you want to prevent pregnancy, talk to your health care provider about birth control (contraception). OSTEOPOROSIS AND MENOPAUSE   Osteoporosis is a disease in which the bones lose minerals and strength with aging. This can result in serious bone fractures. Your risk for osteoporosis can be identified using a bone density scan.  If you are 97 years of age or older, or if you are at risk for osteoporosis and fractures, ask your health care provider if you should be screened.  Ask your health care provider whether you should take a calcium or vitamin D supplement to lower your risk for osteoporosis.  Menopause may have certain physical symptoms and risks.  Hormone replacement therapy may reduce some of these symptoms and risks. Talk to your health care provider about whether hormone replacement therapy is right for you.  HOME CARE INSTRUCTIONS   Schedule regular health, dental, and eye exams.  Stay current with your immunizations.   Do not use any tobacco products including cigarettes, chewing tobacco, or electronic cigarettes.  If you are pregnant, do not  drink alcohol.  If you are breastfeeding, limit how much and how often you drink alcohol.  Limit alcohol intake to no more than 1 drink per day for nonpregnant women. One drink equals 12 ounces of beer, 5 ounces of wine, or 1 ounces of hard liquor.  Do not use street drugs.  Do not share needles.  Ask your health care provider for help if you need support or information about quitting drugs.  Tell your health care provider if you often feel depressed.  Tell your health care provider if you have ever been abused or do not feel safe at home. Document Released: 07/22/2010 Document Revised: 05/23/2013 Document Reviewed: 12/08/2012 [facility] Patient Information 2015 [place], [place]. This information is not intended to replace advice given to you by your health care provider. Make sure you discuss any questions you have with your health care provider.

## 2014-03-30 NOTE — Assessment & Plan Note (Signed)

## 2014-03-30 NOTE — Progress Notes (Addendum)
Patient ID: Amber Sullivan, female   DOB: [birth year], 59 y.o.   MRN: [id number]     Subjective:     Amber Sullivan is a 59 y.o. female and is here for a comprehensive physical exam. The patient reports no problems.  History   Social History  . Marital Status: Married    Spouse Name: N/A  . Number of Children: N/A  . Years of Education: N/A   Occupational History  . Not on file.   Social History Main Topics  . Smoking status: Never Smoker   . Smokeless tobacco: Never Used  . Alcohol Use: Yes  . Drug Use: No  . Sexual Activity: Not on file   Other Topics Concern  . Not on file   Social History Narrative   Health Maintenance  Topic Date Due  . HIV Screening  10/10/1968  . COLONOSCOPY  10/11/2003  . ZOSTAVAX  10/10/2013  . INFLUENZA VACCINE  04/19/2015 (Originally 08/20/2013)  . MAMMOGRAM  09/15/2015  . PAP SMEAR  03/29/2017  . TETANUS/TDAP  10/13/2017    The following portions of the patient's history were reviewed and updated as appropriate: allergies, current medications, past family history, past medical history, past social history, past surgical history and problem list.  Review of Systems  Patient denies headache, fevers, malaise, unintentional weight loss, skin rash, eye pain, sinus congestion and sinus pain, sore throat, dysphagia,  hemoptysis , cough, dyspnea, wheezing, chest pain, palpitations, orthopnea, edema, abdominal pain, nausea, melena, diarrhea, constipation, flank pain, dysuria, hematuria, urinary  Frequency, nocturia, numbness, tingling, seizures,  Focal weakness, Loss of consciousness,  Tremor, insomnia, depression, anxiety, and suicidal ideation.      Objective:   BP 104/62 mmHg  Pulse 94  Temp(Src) 97.7 F (36.5 C) (Oral)  Resp 14  Ht 5\' 6"  (1.676 m)  Wt 143 lb 8 oz (65.091 kg)  BMI 23.17 kg/m2  SpO2 98%   General Appearance:    Alert, cooperative, no distress, appears stated age  Head:    Normocephalic, without obvious  abnormality, atraumatic  Eyes:    PERRL, conjunctiva/corneas clear, EOM's intact, fundi    benign, both eyes  Ears:    Normal TM's and external ear canals, both ears  Nose:   Nares normal, septum midline, mucosa normal, no drainage    or sinus tenderness  Throat:   Lips, mucosa, and tongue normal; teeth and gums normal  Neck:   Supple, symmetrical, trachea midline, no adenopathy;    thyroid:  no enlargement/tenderness/nodules; no carotid   bruit or JVD  Back:     Symmetric, no curvature, ROM normal, no CVA tenderness  Lungs:     Clear to auscultation bilaterally, respirations unlabored  Chest Wall:    No tenderness or deformity   Heart:    Regular rate and rhythm, S1 and S2 normal, no murmur, rub   or gallop  Breast Exam:    No tenderness, masses, or nipple abnormality  Abdomen:     Soft, non-tender, bowel sounds active all four quadrants,    no masses, no organomegaly  Genitalia:    Pelvic: cervix normal in appearance, external genitalia normal, no adnexal masses or tenderness, no cervical motion tenderness, rectovaginal septum normal, uterus normal size, shape, and consistency and vagina normal without discharge  Extremities:   Extremities normal, atraumatic, no cyanosis or edema  Pulses:   2+ and symmetric all extremities  Skin:   Skin color, texture, turgor normal, no rashes or lesions  Lymph  nodes:   Cervical, supraclavicular, and axillary nodes normal  Neurologic:   CNII-XII intact, normal strength, sensation and reflexes    throughout       Assessment and Plan:    Problem List Items Addressed This Visit      Unprioritized   Screening for colon cancer - Primary   Relevant Orders   Ambulatory referral to General Surgery   Routine general medical examination at a health care facility    .Annual wellness  exam was done as well as a comprehensive physical exam and management of acute and chronic conditions .  During the course of the visit the patient was educated and  counseled about appropriate screening and preventive services including :  diabetes screening, lipid analysis with projected  10 year  risk for CAD , nutrition counseling, colorectal cancer screening, and recommended immunizations.  Printed recommendations for health maintenance screenings was given.        Hypertension    Well controlled on current regimen. Renal function stable, no changes today.  Lab Results  Component Value Date   CREATININE 0.7 01/30/2014   Lab Results  Component Value Date   NA 139 01/30/2014   K 4.5 01/30/2014   CL 104 01/30/2014   CO2 27 01/30/2014         Hyperlipidemia    LDL and triglycerides are at goal on current medications. She has no side effects and liver enzymes are normal. No changes today   Lab Results  Component Value Date   CHOL 197 01/30/2014   HDL 58.50 01/30/2014   LDLCALC 123* 01/30/2014   TRIG 77.0 01/30/2014   CHOLHDL 3 01/30/2014   Lab Results  Component Value Date   ALT 17 01/30/2014   AST 17 01/30/2014   ALKPHOS 61 01/30/2014   BILITOT 0.9 01/30/2014           History of abnormal cervical Pap smear   Relevant Orders   Cytology - PAP (Completed)    Other Visit Diagnoses    Screening for osteoporosis        Relevant Orders    DG Bone Density

## 2014-03-30 NOTE — Progress Notes (Signed)
Pre-visit discussion using our clinic review tool. No additional management support is needed unless otherwise documented below in the visit note.  

## 2014-03-31 LAB — CYTOLOGY - PAP

## 2014-04-01 ENCOUNTER — Encounter: Payer: Self-pay | Admitting: Internal Medicine

## 2014-04-01 NOTE — Assessment & Plan Note (Signed)
Well controlled on current regimen. Renal function stable, no changes today.  Lab Results  Component Value Date   CREATININE 0.7 01/30/2014   Lab Results  Component Value Date   NA 139 01/30/2014   K 4.5 01/30/2014   CL 104 01/30/2014   CO2 27 01/30/2014

## 2014-04-01 NOTE — Assessment & Plan Note (Signed)
LDL and triglycerides are at goal on current medications. She has no side effects and liver enzymes are normal. No changes today   Lab Results  Component Value Date   CHOL 197 01/30/2014   HDL 58.50 01/30/2014   LDLCALC 123* 01/30/2014   TRIG 77.0 01/30/2014   CHOLHDL 3 01/30/2014   Lab Results  Component Value Date   ALT 17 01/30/2014   AST 17 01/30/2014   ALKPHOS 61 01/30/2014   BILITOT 0.9 01/30/2014

## 2014-04-03 ENCOUNTER — Encounter: Payer: Self-pay | Admitting: *Deleted

## 2014-04-03 ENCOUNTER — Encounter: Payer: Self-pay | Admitting: Family Medicine

## 2014-04-03 ENCOUNTER — Ambulatory Visit (INDEPENDENT_AMBULATORY_CARE_PROVIDER_SITE_OTHER): Payer: BLUE CROSS/BLUE SHIELD | Admitting: Family Medicine

## 2014-04-03 VITALS — BP 122/72 | HR 83 | Ht 66.0 in | Wt 146.0 lb

## 2014-04-03 DIAGNOSIS — M7552 Bursitis of left shoulder: Secondary | ICD-10-CM

## 2014-04-03 NOTE — Patient Instructions (Addendum)
Good to see you Continue with the icing You are doing great Continue the exercises 2-3 times a week for 6 weeks and alternate arms See me when you need me.

## 2014-04-03 NOTE — Progress Notes (Signed)
  [facility] [place], [place] [address] Phone: [phone] Subjective:     CC: Left shoulder pain follow up  MGN:[id number] Amber Sullivan is a 59 y.o. female coming in with complaint of left shoulder pain. Patient was seen previously and was diagnosed with a subacromial bursitis. Patient had this injection one month ago. Patient was to do home exercises, icing protocol as well as topical medications. Patient also went to formal physical therapy for 2 weeks. Patient states she is 100% pain free at this time.    Past medical history, social, surgical and family history all reviewed in electronic medical record.   Review of Systems: No headache, visual changes, nausea, vomiting, diarrhea, constipation, dizziness, abdominal pain, skin rash, fevers, chills, night sweats, weight loss, swollen lymph nodes, body aches, joint swelling, muscle aches, chest pain, shortness of breath, mood changes.   Objective Blood pressure 122/72, pulse 83, height 5\' 6"  (1.676 m), weight 146 lb (66.225 kg), SpO2 99 %.  General: No apparent distress alert and oriented x3 mood and affect normal, dressed appropriately.  HEENT: Pupils equal, extraocular movements intact  Respiratory: Patient's speak in full sentences and does not appear short of breath  Cardiovascular: No lower extremity edema, non tender, no erythema  Skin: Warm dry intact with no signs of infection or rash on extremities or on axial skeleton.  Abdomen: Soft nontender  Neuro: Cranial nerves II through XII are intact, neurovascularly intact in all extremities with 2+ DTRs and 2+ pulses.  Lymph: No lymphadenopathy of posterior or anterior cervical chain or axillae bilaterally.  Gait normal with good balance and coordination.  MSK:  Non tender with full range of motion and good stability and symmetric strength and tone of  elbows, wrist, hip, knee and ankles bilaterally.  Shoulder: left Inspection reveals  no abnormalities, atrophy or asymmetry. Palpation is normal with no tenderness over AC joint or bicipital groove. ROM is full in all planes passively. Rotator cuff strength normal throughout. Negative and pivoted signs Speeds and Yergason's tests normal. No labral pathology noted with negative Obrien's, negative clunk and good stability. Normal scapular function observed. No painful arc and no drop arm sign. No apprehension sign Contralateral shoulder unremarkable      Impression and Recommendations:     This case required medical decision making of moderate complexity.

## 2014-04-03 NOTE — Progress Notes (Signed)
Pre visit review using our clinic review tool, if applicable. No additional management support is needed unless otherwise documented below in the visit note. 

## 2014-04-03 NOTE — Assessment & Plan Note (Signed)
Healed at this time. Encourage patient to continue the home exercises as well as the icing protocol. Patient will start to increase her activity as tolerated and will follow-up with me on an as-needed basis.

## 2014-04-07 ENCOUNTER — Encounter: Payer: Self-pay | Admitting: *Deleted

## 2014-04-10 ENCOUNTER — Other Ambulatory Visit: Payer: Self-pay | Admitting: Internal Medicine

## 2014-04-10 NOTE — Telephone Encounter (Signed)
Patient called requesting refill on Ambien. Ok to fill?

## 2014-04-11 MED ORDER — ZOLPIDEM TARTRATE 10 MG PO TABS: 5.0000 mg | ORAL_TABLET | Freq: Every evening | ORAL | Status: DC | PRN

## 2014-04-11 NOTE — Telephone Encounter (Signed)
Rx faxed to [facility]

## 2014-04-11 NOTE — Telephone Encounter (Signed)
Ok to refill,  Authorized in epic 

## 2014-04-25 ENCOUNTER — Ambulatory Visit: Payer: Self-pay | Admitting: General Surgery

## 2014-05-25 ENCOUNTER — Ambulatory Visit: Payer: Self-pay | Admitting: General Surgery

## 2014-05-31 ENCOUNTER — Encounter: Payer: Self-pay | Admitting: *Deleted

## 2014-06-06 ENCOUNTER — Ambulatory Visit: Payer: BLUE CROSS/BLUE SHIELD

## 2014-07-04 ENCOUNTER — Ambulatory Visit
Admission: RE | Admit: 2014-07-04 | Discharge: 2014-07-04 | Disposition: A | Discharge status: Home / Self Care | Payer: BLUE CROSS/BLUE SHIELD | Source: Ambulatory Visit | Attending: Internal Medicine | Admitting: Internal Medicine

## 2014-07-04 DIAGNOSIS — Z13820 Encounter for screening for osteoporosis: Secondary | ICD-10-CM | POA: Diagnosis not present

## 2014-07-10 ENCOUNTER — Other Ambulatory Visit: Payer: Self-pay | Admitting: Internal Medicine

## 2014-07-11 ENCOUNTER — Encounter: Payer: Self-pay | Admitting: *Deleted

## 2014-10-05 ENCOUNTER — Ambulatory Visit: Payer: BLUE CROSS/BLUE SHIELD | Admitting: Internal Medicine

## 2014-10-25 ENCOUNTER — Encounter: Payer: Self-pay | Admitting: Internal Medicine

## 2014-10-25 ENCOUNTER — Ambulatory Visit (INDEPENDENT_AMBULATORY_CARE_PROVIDER_SITE_OTHER): Payer: BLUE CROSS/BLUE SHIELD | Admitting: Internal Medicine

## 2014-10-25 VITALS — BP 112/70 | HR 88 | Temp 98.1°F | Resp 12 | Ht 66.0 in | Wt 149.2 lb

## 2014-10-25 DIAGNOSIS — E559 Vitamin D deficiency, unspecified: Secondary | ICD-10-CM

## 2014-10-25 DIAGNOSIS — Z23 Encounter for immunization: Secondary | ICD-10-CM

## 2014-10-25 DIAGNOSIS — E785 Hyperlipidemia, unspecified: Secondary | ICD-10-CM

## 2014-10-25 DIAGNOSIS — I10 Essential (primary) hypertension: Secondary | ICD-10-CM

## 2014-10-25 DIAGNOSIS — M810 Age-related osteoporosis without current pathological fracture: Secondary | ICD-10-CM

## 2014-10-25 DIAGNOSIS — F5104 Psychophysiologic insomnia: Secondary | ICD-10-CM

## 2014-10-25 DIAGNOSIS — Z1239 Encounter for other screening for malignant neoplasm of breast: Secondary | ICD-10-CM

## 2014-10-25 DIAGNOSIS — Z79899 Other long term (current) drug therapy: Secondary | ICD-10-CM | POA: Diagnosis not present

## 2014-10-25 DIAGNOSIS — G4700 Insomnia, unspecified: Secondary | ICD-10-CM

## 2014-10-25 LAB — COMPREHENSIVE METABOLIC PANEL
ALT: 15 U/L (ref 0–35)
AST: 14 U/L (ref 0–37)
Albumin: 4.1 g/dL (ref 3.5–5.2)
Alkaline Phosphatase: 63 U/L (ref 39–117)
BUN: 9 mg/dL (ref 6–23)
CO2: 31 mEq/L (ref 19–32)
Calcium: 9.6 mg/dL (ref 8.4–10.5)
Chloride: 103 mEq/L (ref 96–112)
Creatinine, Ser: 0.77 mg/dL (ref 0.40–1.20)
GFR: 80.99 mL/min (ref >60.00)
Glucose, Bld: 78 mg/dL (ref 70–99)
Potassium: 4.9 mEq/L (ref 3.5–5.1)
Sodium: 140 mEq/L (ref 135–145)
Total Bilirubin: 0.8 mg/dL (ref 0.2–1.2)
Total Protein: 6.3 g/dL (ref 6.0–8.3)

## 2014-10-25 LAB — VITAMIN D 25 HYDROXY (VIT D DEFICIENCY, FRACTURES): VITD: 29.6 ng/mL — ABNORMAL LOW (ref 30.00–100.00)

## 2014-10-25 LAB — LDL CHOLESTEROL, DIRECT: Direct LDL: 109 mg/dL

## 2014-10-25 MED ORDER — ZOLPIDEM TARTRATE 10 MG PO TABS: 5.0000 mg | ORAL_TABLET | Freq: Every evening | ORAL | Status: DC | PRN

## 2014-10-25 MED ORDER — LOSARTAN POTASSIUM 100 MG PO TABS: 100.0000 mg | ORAL_TABLET | Freq: Every day | ORAL | Status: DC

## 2014-10-25 NOTE — Patient Instructions (Addendum)
   You may want to try the "Orgain"  Brand of organic almond milk because it has more protein (10 mg per 8 ounce, compared to 1 gram/8 ounce) than the other brands of almond milk . It has the same amount of calcium as a glass of milk and is cholesterol free and low calorie.  Its available at [facility] and at the Co op

## 2014-10-25 NOTE — Progress Notes (Signed)
Pre-visit discussion using our clinic review tool. No additional management support is needed unless otherwise documented below in the visit note.  

## 2014-10-27 ENCOUNTER — Encounter: Payer: Self-pay | Admitting: Internal Medicine

## 2014-10-27 ENCOUNTER — Encounter: Payer: Self-pay | Admitting: *Deleted

## 2014-10-27 ENCOUNTER — Other Ambulatory Visit: Payer: Self-pay | Admitting: Internal Medicine

## 2014-10-27 DIAGNOSIS — F5104 Psychophysiologic insomnia: Secondary | ICD-10-CM | POA: Insufficient documentation

## 2014-10-27 DIAGNOSIS — M810 Age-related osteoporosis without current pathological fracture: Secondary | ICD-10-CM | POA: Insufficient documentation

## 2014-10-27 NOTE — Assessment & Plan Note (Addendum)
Chronic, with no improvement using over-the-counter first generation antihistamines and melatonin, . The risks and benefits of continued use of [place]  Were reviewed  with patient today including excessive sedation leading to respiratory depression,  impaired thinking/driving, and addiction.  Patient was advised to avoid concurrent use with alcohol, to use medication only as needed and not to share with others  .

## 2014-10-27 NOTE — Assessment & Plan Note (Signed)
By June 2016 DEXA scan.  Risks and benefits of pharmacologic therapy were discussed with patient and she has deferred therapy for now.  Discussed the current controversies surrounding the risks and benefits of calcium supplementation.  Encouraged her to increase dietary calcium through natural foods including almond/coconut milk .  Vitamin D level checked today was normal.

## 2014-10-27 NOTE — Assessment & Plan Note (Signed)
Managed with low potency statin.  Marland Kitchenliver enzymes normal.  Lab Results  Component Value Date   CHOL 197 01/30/2014   HDL 58.50 01/30/2014   LDLCALC 123* 01/30/2014   LDLDIRECT 109.0 10/25/2014   TRIG 77.0 01/30/2014   CHOLHDL 3 01/30/2014   .

## 2014-10-27 NOTE — Assessment & Plan Note (Signed)
CMET was done today to monitor electrolytes, renal function and liver enzymes given chronic use of ARB, statin and NSAIDs,  All were normal.  Lab Results  Component Value Date   NA 140 10/25/2014   K 4.9 10/25/2014   CL 103 10/25/2014   CO2 31 10/25/2014   Lab Results  Component Value Date   CREATININE 0.77 10/25/2014   Lab Results  Component Value Date   ALT 15 10/25/2014   AST 14 10/25/2014   ALKPHOS 63 10/25/2014   BILITOT 0.8 10/25/2014

## 2014-10-27 NOTE — Progress Notes (Signed)
Subjective:  Patient ID: Amber Sullivan, female    DOB: 1955-08-21  Age: 59 y.o. MRN: [id number]  CC: The primary encounter diagnosis was Encounter for immunization. Diagnoses of Hyperlipidemia LDL goal <130, Vitamin D deficiency, Long-term use of high-risk medication, Screening for breast cancer, Hyperlipidemia, Essential hypertension, Chronic insomnia, and Osteoporosis were also pertinent to this visit.  HPI Amber Sullivan presents for follow up on hyperlipidemia, hypertension, OA and insomnia.  Patient is taking her medications as prescribed and notes no adverse effects.  Home BP readings have been done about once per month and are always  < 130/80 .  She is avoiding added salt in her diet and walking regularly about 5 times per week for exercise  .  Outpatient Prescriptions Prior to Visit  Medication Sig Dispense Refill  . Calcium Carbonate-Vit D-Min (CALTRATE 600+D PLUS MINERALS) 600-800 MG-UNIT TABS Take 1 tablet by mouth daily. Taking 1 tablet every other day    . Diclofenac Sodium (PENNSAID) 2 % SOLN Place 2 application onto the skin 2 (two) times daily. (Patient taking differently: Place 2 application onto the skin 2 (two) times daily as needed. ) 112 g 3  . ESTRACE VAGINAL 0.1 MG/GM vaginal cream USE 7.78 APPLICATORFUL VAGINALLY TWICE WEEKLY 42.5 g 2  . fish oil-omega-3 fatty acids 1000 MG capsule Take 2 g by mouth daily.    . simvastatin (ZOCOR) 20 MG tablet Take 20 mg by mouth every other day.    . zoster vaccine live, PF, (ZOSTAVAX) 24235 UNT/0.65ML injection Inject 19,400 Units into the skin once. 1 each 0  . losartan (COZAAR) 100 MG tablet TAKE 1 TABLET DAILY 90 tablet 1  . zolpidem (AMBIEN) 10 MG tablet Take 0.5 tablets (5 mg total) by mouth at bedtime as needed. 30 tablet 5  . aspirin 81 MG tablet Take 81 mg by mouth daily.     No facility-administered medications prior to visit.    Review of Systems;  Patient denies headache, fevers, malaise, unintentional  weight loss, skin rash, eye pain, sinus congestion and sinus pain, sore throat, dysphagia,  hemoptysis , cough, dyspnea, wheezing, chest pain, palpitations, orthopnea, edema, abdominal pain, nausea, melena, diarrhea, constipation, flank pain, dysuria, hematuria, urinary  Frequency, nocturia, numbness, tingling, seizures,  Focal weakness, Loss of consciousness,  Tremor, insomnia, depression, anxiety, and suicidal ideation.      Objective:  BP 112/70 mmHg  Pulse 88  Temp(Src) 98.1 F (36.7 C) (Oral)  Resp 12  Ht 5\' 6"  (1.676 m)  Wt 149 lb 4 oz (67.699 kg)  BMI 24.10 kg/m2  SpO2 98%  BP Readings from Last 3 Encounters:  10/25/14 112/70  04/03/14 122/72  03/30/14 104/62    Wt Readings from Last 3 Encounters:  10/25/14 149 lb 4 oz (67.699 kg)  04/03/14 146 lb (66.225 kg)  03/30/14 143 lb 8 oz (65.091 kg)    General appearance: alert, cooperative and appears stated age Ears: normal TM's and external ear canals both ears Throat: lips, mucosa, and tongue normal; teeth and gums normal Neck: no adenopathy, no carotid bruit, supple, symmetrical, trachea midline and thyroid not enlarged, symmetric, no tenderness/mass/nodules Back: symmetric, no curvature. ROM normal. No CVA tenderness. Lungs: clear to auscultation bilaterally Heart: regular rate and rhythm, S1, S2 normal, no murmur, click, rub or gallop Abdomen: soft, non-tender; bowel sounds normal; no masses,  no organomegaly Pulses: 2+ and symmetric Skin: Skin color, texture, turgor normal. No rashes or lesions Lymph nodes: Cervical, supraclavicular, and axillary nodes normal.  No results found for: HGBA1C  Lab Results  Component Value Date   CREATININE 0.77 10/25/2014   CREATININE 0.7 01/30/2014   CREATININE 0.7 05/09/2013    Lab Results  Component Value Date   GLUCOSE 78 10/25/2014   CHOL 197 01/30/2014   TRIG 77.0 01/30/2014   HDL 58.50 01/30/2014   LDLDIRECT 109.0 10/25/2014   LDLCALC 123* 01/30/2014   ALT 15  10/25/2014   AST 14 10/25/2014   NA 140 10/25/2014   K 4.9 10/25/2014   CL 103 10/25/2014   CREATININE 0.77 10/25/2014   BUN 9 10/25/2014   CO2 31 10/25/2014   TSH 1.67 08/30/2012   MICROALBUR 0.5 08/30/2012    Dg Bone Density  07/04/2014   EXAM: DUAL X-RAY ABSORPTIOMETRY (DXA) FOR BONE MINERAL DENSITY  IMPRESSION: Amber Sullivan,  Your patient Amber Sullivan completed a BMD test on 07/04/2014 using the [employer] (analysis version: 14.10) manufactured by [employer]. The following summarizes the results of our evaluation.  PATIENT BIOGRAPHICAL: Name: Amber Sullivan, Amber Sullivan Patient ID: [id number] Birth Date: Apr 03, 1955 Height: 65.0 in. Gender: Female Exam Date: 07/04/2014 Weight: 146.6 lbs. Indications: Caucasian, History of Fracture (Adult), POSTmenopausal Fractures: ankle, Foot Treatments: calcium /vit d, multivitamin  ASSESSMENT: The BMD measured at AP Spine L1-L4 is 0.882 g/cm2 with a T-score of -2.5. This patient's diagnostic category is considered OSTEOPOROSIS according to [place] [facility]) criteria.  Site Region Measured Measured WHO Young Adult BMD Date       Age      Classification T-score AP Spine L1-L4 07/04/2014 60.7 Osteoporosis -2.5 0.882 g/cm2  DualFemur Neck Left 07/04/2014 60.7 Osteopenia -2.0 0.758 g/cm2  World Health Organization [facility]) criteria for post-menopausal, Caucasian Women: Normal:       T-score at or above -1 SD Osteopenia:   T-score between -1 and -2.5 SD Osteoporosis: T-score at or below -2.5 SD  RECOMMENDATIONS:  [place] recommends that FDA-approved medical therapies be considered in postmenopausal women and men age 58 or older with a:  1. Hip or vertebral (clinical or morphometric) fracture. 2. T-score of < -2.5 at the spine or hip. 3. Ten-year fracture probability by FRAX of 3% or greater for hip fracture or 20% or greater for major osteoporotic fracture.  All treatment decisions require clinical judgment and  consideration of individual patient factors, including patient preferences, co-morbidities, previous drug use, risk factors not captured in the FRAX model (e.g. falls, vitamin D deficiency, increased bone turnover, interval significant decline in bone density) and possible under - or over-estimation of fracture risk by FRAX.  All patients should ensure an adequate intake of dietary calcium (1200 mg/d) and vitamin D (800 IU daily) unless contraindicated.  FOLLOW-UP: People with diagnosed cases of osteoporosis or at high risk for fracture should have regular bone mineral density tests. For patients eligible for Medicare, routine testing is allowed once every 2 years. The testing frequency can be increased to one year for patients who have rapidly progressing disease, those who are receiving or discontinuing medical therapy to restore bone mass, or have additional risk factors.  I have reviewed this report, and agree with the above findings.  Hassan Rowan, MD [facility] Radiology   Electronically Signed   By: Margarette Canada M.D.   On: 07/04/2014 08:37    Assessment & Plan:   Problem List Items Addressed This Visit    Long-term use of high-risk medication (Chronic)    CMET was done today to monitor electrolytes, renal  function and liver enzymes given chronic use of ARB, statin and NSAIDs,  All were normal.  Lab Results  Component Value Date   NA 140 10/25/2014   K 4.9 10/25/2014   CL 103 10/25/2014   CO2 31 10/25/2014   Lab Results  Component Value Date   CREATININE 0.77 10/25/2014   Lab Results  Component Value Date   ALT 15 10/25/2014   AST 14 10/25/2014   ALKPHOS 63 10/25/2014   BILITOT 0.8 10/25/2014         Relevant Orders   Comprehensive metabolic panel (Completed)   Hyperlipidemia    Managed with low potency statin.  Marland Kitchenliver enzymes normal.  Lab Results  Component Value Date   CHOL 197 01/30/2014   HDL 58.50 01/30/2014   LDLCALC 123* 01/30/2014   LDLDIRECT 109.0 10/25/2014   TRIG  77.0 01/30/2014   CHOLHDL 3 01/30/2014   .      Relevant Medications   losartan (COZAAR) 100 MG tablet   Hypertension    Well controlled on current regimen. Renal function stable, no changes today.      Relevant Medications   losartan (COZAAR) 100 MG tablet   Chronic insomnia    Chronic, with no improvement using over-the-counter first generation antihistamines and melatonin, . The risks and benefits of continued use of [place]  Were reviewed  with patient today including excessive sedation leading to respiratory depression,  impaired thinking/driving, and addiction.  Patient was advised to avoid concurrent use with alcohol, to use medication only as needed and not to share with others  .       Osteoporosis    By June 2016 DEXA scan.  Risks and benefits of pharmacologic therapy were discussed with patient and she has deferred therapy for now.  Discussed the current controversies surrounding the risks and benefits of calcium supplementation.  Encouraged her to increase dietary calcium through natural foods including almond/coconut milk .  Vitamin D level checked today was normal.        Other Visit Diagnoses    Encounter for immunization    -  Primary    Hyperlipidemia LDL goal <130        Relevant Medications    losartan (COZAAR) 100 MG tablet    Other Relevant Orders    LDL cholesterol, direct (Completed)    Vitamin D deficiency        Relevant Orders    Vit D  25 hydroxy (rtn osteoporosis monitoring) (Completed)    Screening for breast cancer        Relevant Orders    MM DIGITAL SCREENING BILATERAL     A total of 25 minutes of face to face time was spent with patient more than half of which was spent in counselling and coordination of care    I have changed Amber Sullivan's losartan. I am also having her maintain her aspirin, fish oil-omega-3 fatty acids, simvastatin, Diclofenac Sodium, ESTRACE VAGINAL, CALTRATE 600+D PLUS MINERALS, zoster vaccine live (PF), and  zolpidem.  Meds ordered this encounter  Medications  . losartan (COZAAR) 100 MG tablet    Sig: Take 1 tablet (100 mg total) by mouth daily.    Dispense:  90 tablet    Refill:  1  . zolpidem (AMBIEN) 10 MG tablet    Sig: Take 0.5 tablets (5 mg total) by mouth at bedtime as needed.    Dispense:  45 tablet    Refill:  3    Medications Discontinued During This  Encounter  Medication Reason  . losartan (COZAAR) 100 MG tablet Reorder  . zolpidem (AMBIEN) 10 MG tablet Reorder    Follow-up: Return in about 6 months (around 04/25/2015).   Crecencio Mc, MD

## 2014-10-27 NOTE — Assessment & Plan Note (Signed)
Well controlled on current regimen. Renal function stable, no changes today. 

## 2014-11-13 ENCOUNTER — Encounter: Payer: Self-pay | Admitting: Family Medicine

## 2014-11-13 ENCOUNTER — Ambulatory Visit (INDEPENDENT_AMBULATORY_CARE_PROVIDER_SITE_OTHER): Payer: BLUE CROSS/BLUE SHIELD | Admitting: Family Medicine

## 2014-11-13 VITALS — BP 130/84 | HR 80 | Temp 97.8°F | Ht 66.0 in | Wt 145.5 lb

## 2014-11-13 DIAGNOSIS — H8110 Benign paroxysmal vertigo, unspecified ear: Secondary | ICD-10-CM

## 2014-11-13 MED ORDER — LORAZEPAM 1 MG PO TABS: 1.0000 mg | ORAL_TABLET | Freq: Three times a day (TID) | ORAL | Status: DC | PRN

## 2014-11-13 NOTE — Patient Instructions (Addendum)
It was nice to see you today.  We will call with your PT appt.  Follow up as needed.  Take care  Dr. Lacinda Axon  Benign Positional Vertigo Vertigo is the feeling that you or your surroundings are moving when they are not. Benign positional vertigo is the most common form of vertigo. The cause of this condition is not serious (is benign). This condition is triggered by certain movements and positions (is positional). This condition can be dangerous if it occurs while you are doing something that could endanger you or others, such as driving.  CAUSES In many cases, the cause of this condition is not known. It may be caused by a disturbance in an area of the inner ear that helps your brain to sense movement and balance. This disturbance can be caused by a viral infection (labyrinthitis), head injury, or repetitive motion. RISK FACTORS This condition is more likely to develop in:  Women.  People who are 93 years of age or older. SYMPTOMS Symptoms of this condition usually happen when you move your head or your eyes in different directions. Symptoms may start suddenly, and they usually last for less than a minute. Symptoms may include:  Loss of balance and falling.  Feeling like you are spinning or moving.  Feeling like your surroundings are spinning or moving.  Nausea and vomiting.  Blurred vision.  Dizziness.  Involuntary eye movement (nystagmus). Symptoms can be mild and cause only slight annoyance, or they can be severe and interfere with daily life. Episodes of benign positional vertigo may return (recur) over time, and they may be triggered by certain movements. Symptoms may improve over time. DIAGNOSIS This condition is usually diagnosed by medical history and a physical exam of the head, neck, and ears. You may be referred to a health care provider who specializes in ear, nose, and throat (ENT) problems (otolaryngologist) or a provider who specializes in disorders of the nervous  system (neurologist). You may have additional testing, including:  MRI.  A CT scan.  Eye movement tests. Your health care provider may ask you to change positions quickly while he or she watches you for symptoms of benign positional vertigo, such as nystagmus. Eye movement may be tested with an electronystagmogram (ENG), caloric stimulation, the Dix-Hallpike test, or the roll test.  An electroencephalogram (EEG). This records electrical activity in your brain.  Hearing tests. TREATMENT Usually, your health care provider will treat this by moving your head in specific positions to adjust your inner ear back to normal. Surgery may be needed in severe cases, but this is rare. In some cases, benign positional vertigo may resolve on its own in 2-4 weeks. HOME CARE INSTRUCTIONS Safety  Move slowly.Avoid sudden body or head movements.  Avoid driving.  Avoid operating heavy machinery.  Avoid doing any tasks that would be dangerous to you or others if a vertigo episode would occur.  If you have trouble walking or keeping your balance, try using a cane for stability. If you feel dizzy or unstable, sit down right away.  Return to your normal activities as told by your health care provider. Ask your health care provider what activities are safe for you. General Instructions  Take over-the-counter and prescription medicines only as told by your health care provider.  Avoid certain positions or movements as told by your health care provider.  Drink enough fluid to keep your urine clear or pale yellow.  Keep all follow-up visits as told by your health care provider.  This is important. SEEK MEDICAL CARE IF:  You have a fever.  Your condition gets worse or you develop new symptoms.  Your family or friends notice any behavioral changes.  Your nausea or vomiting gets worse.  You have numbness or a "pins and needles" sensation. SEEK IMMEDIATE MEDICAL CARE IF:  You have difficulty  speaking or moving.  You are always dizzy.  You faint.  You develop severe headaches.  You have weakness in your legs or arms.  You have changes in your hearing or vision.  You develop a stiff neck.  You develop sensitivity to light.   This information is not intended to replace advice given to you by your health care provider. Make sure you discuss any questions you have with your health care provider.   Document Released: 10/14/2005 Document Revised: 09/27/2014 Document Reviewed: 05/01/2014 Elsevier Interactive Patient Education [employer].

## 2014-11-13 NOTE — Assessment & Plan Note (Signed)
History consistent with BPPV. Treatment: Referral to vestibular rehabilitation, Ativan as needed.

## 2014-11-13 NOTE — Progress Notes (Signed)
   Subjective:  Patient ID: Amber Sullivan, female    DOB: [birth year]  Age: 59 y.o. MRN: [id number]  CC: Vertigo  HPI:  59 year old female presents to clinic today with complaints of vertigo.  Patient reports that she's had vertigo intermittently for the past week. She's had approximately 3 episodes of sudden onset dizziness (described as room spinning). She denies any associated nausea or vomiting. She states that these episodes last for minutes and then spontaneously resolved. She states that they were brought about by certain movements. No known exacerbating or relieving factors. She said that she has had this previously about 7-8 years ago. She denies any symptoms of presyncope.  Social Hx   Social History   Social History  . Marital Status: Married    Spouse Name: N/A  . Number of Children: N/A  . Years of Education: N/A   Social History Main Topics  . Smoking status: Never Smoker   . Smokeless tobacco: Never Used  . Alcohol Use: Yes  . Drug Use: No  . Sexual Activity: Not Asked   Other Topics Concern  . None   Social History Narrative   Review of Systems  Constitutional: Negative.   HENT: Negative for hearing loss and tinnitus.   Gastrointestinal: Negative for nausea.  Neurological:       Vertigo. Denies slurred speech, facial drop and other focal neurological deficits.    Objective:  BP 130/84 mmHg  Pulse 80  Temp(Src) 97.8 F (36.6 C) (Oral)  Ht 5\' 6"  (1.676 m)  Wt 145 lb 8 oz (65.998 kg)  BMI 23.50 kg/m2  SpO2 99%  BP/Weight 11/13/2014 10/25/2014 7/82/9562  Systolic BP 130 865 784  Diastolic BP 84 70 72  Wt. (Lbs) 145.5 149.25 146  BMI 23.5 24.1 23.58   Physical Exam  Constitutional: She is oriented to person, place, and time. She appears well-developed and well-nourished. No distress.  HENT:  Head: Normocephalic and atraumatic.  Eyes: Pupils are equal, round, and reactive to light.  Cardiovascular: Normal rate and regular rhythm.     Pulmonary/Chest: Effort normal and breath sounds normal. No respiratory distress. She has no wheezes. She has no rales.  Abdominal: Soft. She exhibits no distension. There is no tenderness.  Neurological: She is alert and oriented to person, place, and time.  CN 2-12 grossly intact. Nystagmus with lateral eye movements.  Normal muscle strength throughout. Sensation grossly intact.  Vitals reviewed.  Assessment & Plan:   Problem List Items Addressed This Visit    BPPV (benign paroxysmal positional vertigo) - Primary    History consistent with BPPV. Treatment: Referral to vestibular rehabilitation, Ativan as needed.      Relevant Orders   Ambulatory referral to Physical Therapy     Meds ordered this encounter  Medications  . LORazepam (ATIVAN) 1 MG tablet    Sig: Take 1 tablet (1 mg total) by mouth every 8 (eight) hours as needed for anxiety.    Dispense:  20 tablet    Refill:  0   Follow-up: PRN  Thersa Salt, DO

## 2014-11-13 NOTE — Progress Notes (Signed)
Pre visit review using our clinic review tool, if applicable. No additional management support is needed unless otherwise documented below in the visit note. 

## 2014-11-14 ENCOUNTER — Ambulatory Visit: Payer: BLUE CROSS/BLUE SHIELD

## 2014-11-15 ENCOUNTER — Telehealth: Payer: Self-pay | Admitting: Internal Medicine

## 2014-11-15 NOTE — Telephone Encounter (Signed)
The patient left a voice mail unsure if the paper work she has is a prescription or has her prescription been called to the pharmacy.

## 2014-11-15 NOTE — Telephone Encounter (Signed)
Clarified with patient that yes she needs to take the prescription to the pharmacy.

## 2014-11-21 ENCOUNTER — Ambulatory Visit: Payer: BLUE CROSS/BLUE SHIELD | Attending: Family Medicine

## 2014-11-21 DIAGNOSIS — R42 Dizziness and giddiness: Secondary | ICD-10-CM | POA: Insufficient documentation

## 2014-11-21 NOTE — Therapy (Signed)
[facility] [address] [place], [place], [address] Phone: [phone]   Fax:  [phone]  Physical Therapy Evaluation  Patient Details  Name: Amber Sullivan MRN: [id number] Date of Birth: Dec 23, 1965 No Data Recorded  Encounter Date: 11/21/2014      PT End of Session - 11/21/14 1500    Visit Number 1   Number of Visits 1   Date for PT Re-Evaluation 11/21/14   Authorization Type BCBS   PT Start Time 1401   PT Stop Time 1446   PT Time Calculation (min) 45 min   Equipment Utilized During Treatment Gait belt   Activity Tolerance Patient tolerated treatment well   Behavior During Therapy [facility] for tasks assessed/performed      Past Medical History  Diagnosis Date  . Hyperlipidemia   . Hypertension     Past Surgical History  Procedure Laterality Date  . Cholecystectomy  2005  . Squamous cell carcinoma excision  2007    mole on face  . Mohs surgery  2007    squamou cell carcinoma rt nasolabial fold    There were no vitals filed for this visit.  Visit Diagnosis:  Dizziness and giddiness - Plan: PT plan of care cert/re-cert      Subjective Assessment - 11/21/14 1409    Subjective Pt reported she had an episode of vertigo last week but feels better now. She worries about her peripheral vision. Pt was on her back while exercising at home and felt a little bit of spin, it became worse several days later when rolling over in bed to look at the clock. Pt performed Epley manuever on her own and felt better. Pt then looked up when walking in [facility] and it settled. Pt last experienced vertigo on 11/11/14. 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. Pt is not sure which ear bothers her. Pt has seen the MD and feels better.  Pt feels uneasy when driving when cars approach from either side and has not been driving much since.    Patient Stated Goals Put my mind at ease and know why this dizziness is happening.     Currently in Pain? No/denies            [facility] PT Assessment - 11/21/14 1417    Assessment   Medical Diagnosis BPPV   Precautions   Precautions None   Restrictions   Weight Bearing Restrictions No   Balance Screen   Has the patient fallen in the past 6 months No   Has the patient had a decrease in activity level because of a fear of falling?  No   Is the patient reluctant to leave their home because of a fear of falling?  No   Home Ecologist residence   Living Arrangements Spouse/significant other   Available Help at Discharge Family   Type of [place] to enter   Entrance Stairs-Number of Steps 2   Entrance Stairs-Rails None   Home Layout One level   Home Equipment None   Prior Function   Level of Independence Independent   Functional Gait  Assessment   Gait assessed  Yes   Gait Level Surface Walks 20 ft in less than 5.5 sec, no assistive devices, good speed, no evidence for imbalance, normal gait pattern, deviates no more than 6 in outside of the 12 in walkway width.   Change in Gait Speed Able to  smoothly change walking speed without loss of balance or gait deviation. Deviate no more than 6 in outside of the 12 in walkway width.   Gait with Horizontal Head Turns Performs head turns smoothly with no change in gait. Deviates no more than 6 in outside 12 in walkway width   Gait with Vertical Head Turns Performs head turns with no change in gait. Deviates no more than 6 in outside 12 in walkway width.   Gait and Pivot Turn Pivot turns safely within 3 sec and stops quickly with no loss of balance.   Step Over Obstacle Is able to step over 2 stacked shoe boxes taped together (9 in total height) without changing gait speed. No evidence of imbalance.   Gait with Narrow Base of Support Is able to ambulate for 10 steps heel to toe with no staggering.   Gait with Eyes Closed Walks 20 ft, uses assistive device, slower speed, mild  gait deviations, deviates 6-10 in outside 12 in walkway width. Ambulates 20 ft in less than 9 sec but greater than 7 sec.   Ambulating Backwards Walks 20 ft, uses assistive device, slower speed, mild gait deviations, deviates 6-10 in outside 12 in walkway width.   Steps Alternating feet, no rail.   Total Score 28            Vestibular Assessment - 11/21/14 0001    Symptom Behavior   Type of Dizziness Spinning   Frequency of Dizziness 5-6 days. She has not experienced dizziness since Sunday, 11/11/14.   Duration of Dizziness Less than 1 minute   Aggravating Factors Rolling to right;Rolling to left;Looking up to the ceiling   Relieving Factors Comments  Epley manuever (pt self performed), not lying down, sitting   Occulomotor Exam   Occulomotor Alignment Normal   Spontaneous Absent   Gaze-induced Absent   Smooth Pursuits Intact   Saccades Intact   Vestibulo-Occular Reflex   VOR 1 Head Only (x 1 viewing) WNL, no dizziness.   Comment Head thrust negative B directions.   Positional Testing   Dix-Hallpike Dix-Hallpike Right;Dix-Hallpike Left   Horizontal Canal Testing Horizontal Canal Right;Horizontal Canal Left   Dix-Hallpike Right   Dix-Hallpike Right Duration none   Dix-Hallpike Right Symptoms No nystagmus   Dix-Hallpike Left   Dix-Hallpike Left Duration none   Dix-Hallpike Left Symptoms No nystagmus   Horizontal Canal Right   Horizontal Canal Right Duration none   Horizontal Canal Right Symptoms Normal   Horizontal Canal Left   Horizontal Canal Left Duration none   Horizontal Canal Left Symptoms Normal                       PT Education - 11/21/14 1459    Education provided Yes   Education Details PT discussed exam and FGA findings. PT educated pt on BPPV, and to notify MD next time it occurs so pt can get into PT to determine which side and to prevent vestibular hypofunctioning 2/2 avoiding movements. PT encouraged pt to sleep without head elevated (as pt  reported she sleeps on multiple pillows to decr. Dizziness), perform head turns, and all movements as normal to improve vestibular input as dizziness appears to be resolved.    Person(s) Educated Patient   Methods Explanation   Comprehension Verbalized understanding          PT Short Term Goals - 11/21/14 1503    PT SHORT TERM GOAL #1   Title eval only  PT Long Term Goals - 11/21/14 1503    PT LONG TERM GOAL #1   Title eval only               Plan - 11/21/14 1500    Clinical Impression Statement Pt is a pleasant 59y/o female presenting to OPPT neuro with a referral for BPPV. However, PT was unable to reproduce symptoms or nystagmus during exam. Additionally, pt scored a 28/30 on FGA indicating she is at a decreased risk for falls. Vestibular assessment and B Dix-Hallike, B horizontal roll testing all negative for nystagmus and symptoms, indicating pt's vertigo is resolved. Pt's vestibular system was WNL during testing. Therefore, pt does not require PT at this time. Thank you for this referral.    PT Frequency One time visit   Consulted and Agree with Plan of Care Patient         Problem List Patient Active Problem List   Diagnosis Date Noted  . BPPV (benign paroxysmal positional vertigo) 11/13/2014  . Chronic insomnia 10/27/2014  . Osteoporosis 10/27/2014  . History of abnormal cervical Pap smear 03/30/2014  . Long-term use of high-risk medication 01/30/2014  . Subacromial bursitis 09/07/2013  . Rotator cuff (capsule) sprain 09/02/2013  . Plantar fasciitis of right foot 05/20/2013  . Tendonitis, Achilles, right 05/09/2013  . Routine general medical examination at a health care facility 10/13/2012  . History of Mohs surgery for squamous cell carcinoma in situ of skin 01/28/2012  . Screening for colon cancer 01/28/2012  . Hyperlipidemia   . Hypertension     Celestial Barnfield L 11/21/2014, 3:07 PM  [facility] [address] [place], [place], [address] Phone: [phone]   Fax:  [phone]  Name: Leontine Radman MRN: [id number] Date of Birth: [birth year]   Geoffry Paradise, PT,DPT 11/21/2014 3:07 PM Phone: [phone] Fax: [phone]

## 2015-04-04 ENCOUNTER — Telehealth: Payer: Self-pay

## 2015-04-04 MED ORDER — SIMVASTATIN 20 MG PO TABS: 20.0000 mg | ORAL_TABLET | ORAL | Status: DC

## 2015-04-05 LAB — HM MAMMOGRAPHY

## 2015-04-05 NOTE — Telephone Encounter (Signed)
Zocor filled

## 2015-04-16 ENCOUNTER — Ambulatory Visit (INDEPENDENT_AMBULATORY_CARE_PROVIDER_SITE_OTHER): Payer: BLUE CROSS/BLUE SHIELD | Admitting: Family Medicine

## 2015-04-16 ENCOUNTER — Encounter: Payer: Self-pay | Admitting: Family Medicine

## 2015-04-16 VITALS — BP 110/78 | HR 74 | Temp 98.0°F | Ht 66.0 in | Wt 150.1 lb

## 2015-04-16 DIAGNOSIS — M1990 Unspecified osteoarthritis, unspecified site: Secondary | ICD-10-CM

## 2015-04-16 DIAGNOSIS — M19049 Primary osteoarthritis, unspecified hand: Secondary | ICD-10-CM

## 2015-04-16 MED ORDER — PREDNISONE 50 MG PO TABS: ORAL_TABLET | ORAL | Status: DC

## 2015-04-16 NOTE — Patient Instructions (Signed)
Take the prednisone as prescribed.  Do not use the topical NSAID while on the prednisone.  Follow up as needed.  Take care  Dr. Lacinda Axon

## 2015-04-16 NOTE — Progress Notes (Signed)
   Subjective:  Patient ID: Amber Sullivan, female    DOB: [birth year]  Age: 60 y.o. MRN: [id number]  CC: Left thumb pain  HPI:  60 year old female presents to clinic today with the above complaint.  Left Thumb Pain  Patient reports that she's had left thumb pain for the past week.  Pain is located proximally at the [facility] joint.  He states that she was recently gardening and also picking up boxes at work which likely caused her pain. She does not know of any recent injury.  She's been using topical diclofenac with improvement.  She reports that it is worse with activity.  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  No other complaints today.  Social Hx   Social History   Social History  . Marital Status: Married    Spouse Name: N/A  . Number of Children: N/A  . Years of Education: N/A   Social History Main Topics  . Smoking status: Never Smoker   . Smokeless tobacco: Never Used  . Alcohol Use: Yes     Comment: not much  . Drug Use: No  . Sexual Activity: Not Asked   Other Topics Concern  . None   Social History Narrative   Review of Systems  Constitutional: Negative.   Musculoskeletal:       Left thumb pain.   Objective:  BP 110/78 mmHg  Pulse 74  Temp(Src) 98 F (36.7 C) (Oral)  Ht 5\' 6"  (1.676 m)  Wt 150 lb 2 oz (68.096 kg)  BMI 24.24 kg/m2  SpO2 98%  BP/Weight 04/16/2015 11/13/2014 [id number]  Systolic BP [id number]  Diastolic BP 78 84 70  Wt. (Lbs) 150.13 145.5 149.25  BMI 24.24 23.5 24.1   Physical Exam  Constitutional: She is oriented to person, place, and time. She appears well-developed. No distress.  Cardiovascular: Normal rate and regular rhythm.   No murmur heard. Pulmonary/Chest: Effort normal and breath sounds normal. She has no wheezes. She has no rales.  Musculoskeletal:  Left Thumb - tenderness noted at the [facility] joint.  Neurological: She is alert  and oriented to person, place, and time.  Vitals reviewed.  Lab Results  Component Value Date   GLUCOSE 78 10/25/2014   CHOL 197 01/30/2014   TRIG 77.0 01/30/2014   HDL 58.50 01/30/2014   LDLDIRECT 109.0 10/25/2014   LDLCALC 123* 01/30/2014   ALT 15 10/25/2014   AST 14 10/25/2014   NA 140 10/25/2014   K 4.9 10/25/2014   CL 103 10/25/2014   CREATININE 0.77 10/25/2014   BUN 9 10/25/2014   CO2 31 10/25/2014   TSH 1.67 08/30/2012   MICROALBUR 0.5 08/30/2012   Assessment & Plan:   Problem List Items Addressed This Visit    [place] arthritis - Primary    New problem. Findings consistent with [facility] arthritis. Patient declined x-ray. Discussed treatment options: Topical/oral anti-inflammatory, prednisone, injection. 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         Meds ordered this encounter  Medications  . predniSONE (DELTASONE) 50 MG tablet    Sig: 1 tablet daily x 5 days.    Dispense:  5 tablet    Refill:  0   Follow-up: PRN  [place]

## 2015-04-16 NOTE — Assessment & Plan Note (Signed)
New problem. Findings consistent with [facility] arthritis. Patient declined x-ray. Discussed treatment options: Topical/oral anti-inflammatory, prednisone, injection. 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

## 2015-04-16 NOTE — Progress Notes (Signed)
Pre visit review using our clinic review tool, if applicable. No additional management support is needed unless otherwise documented below in the visit note. 

## 2015-05-11 DIAGNOSIS — Z85828 Personal history of other malignant neoplasm of skin: Secondary | ICD-10-CM | POA: Diagnosis not present

## 2015-05-19 ENCOUNTER — Encounter: Payer: Self-pay | Admitting: Emergency Medicine

## 2015-05-19 ENCOUNTER — Emergency Department
Admission: EM | Admit: 2015-05-19 | Discharge: 2015-05-19 | Disposition: A | Discharge status: Home / Self Care | Payer: BLUE CROSS/BLUE SHIELD | Attending: Emergency Medicine | Admitting: Emergency Medicine

## 2015-05-19 DIAGNOSIS — H5319 Other subjective visual disturbances: Secondary | ICD-10-CM | POA: Insufficient documentation

## 2015-05-19 DIAGNOSIS — E785 Hyperlipidemia, unspecified: Secondary | ICD-10-CM | POA: Diagnosis not present

## 2015-05-19 DIAGNOSIS — I10 Essential (primary) hypertension: Secondary | ICD-10-CM | POA: Diagnosis not present

## 2015-05-19 DIAGNOSIS — Z79899 Other long term (current) drug therapy: Secondary | ICD-10-CM | POA: Insufficient documentation

## 2015-05-19 DIAGNOSIS — Z85828 Personal history of other malignant neoplasm of skin: Secondary | ICD-10-CM | POA: Diagnosis not present

## 2015-05-19 DIAGNOSIS — Z7982 Long term (current) use of aspirin: Secondary | ICD-10-CM | POA: Insufficient documentation

## 2015-05-19 DIAGNOSIS — H578 Other specified disorders of eye and adnexa: Secondary | ICD-10-CM | POA: Diagnosis present

## 2015-05-19 DIAGNOSIS — M810 Age-related osteoporosis without current pathological fracture: Secondary | ICD-10-CM | POA: Insufficient documentation

## 2015-05-19 NOTE — ED Provider Notes (Signed)
[facility] Emergency Department Provider Note ____________________________________________  Time seen: 0852  I have reviewed the triage vital signs and the nursing notes.  HISTORY  Chief Complaint  Eye Problem  HPI Amber Sullivan is a 60 y.o. female since a the ED accompanied by her husband for evaluation of now resolved visual disturbance of the right eye. This patient describes that she woke this morning as usual without pain, vision loss, headache, dizziness. She began to read a printed tech spoke upon awakening. It is in the morning she describes turning on her computer and for less than 10 minutes she had what she would describe as a "flashing light" that was in her central to peripheral vision towards that 9 o'clock position area; she notes she was able to see through and around this visual defect without difficulty. She denies any eye pain, pressure, discomfort, or foreign body sensation.The patient gives a remote history of a small hemorrhage in the back of her left eye that was found incidentally on her last routine exam 3 months prior to arrival. She also reports that on follow-up exam it had completely resolved, according to her ophthalmologist. She has a history of migraine headaches that have resolved since menopause, dry eyes, and hypertension. She is over-the-counter refresh drops as needed for her dry condition.  Past Medical History  Diagnosis Date  . Hyperlipidemia   . Hypertension     Patient Active Problem List   Diagnosis Date Noted  . [place] arthritis 04/16/2015  . BPPV (benign paroxysmal positional vertigo) 11/13/2014  . Chronic insomnia 10/27/2014  . Osteoporosis 10/27/2014  . History of abnormal cervical Pap smear 03/30/2014  . Long-term use of high-risk medication 01/30/2014  . Subacromial bursitis 09/07/2013  . Rotator cuff (capsule) sprain 09/02/2013  . Plantar fasciitis of right foot 05/20/2013  . Tendonitis, Achilles, right  05/09/2013  . Routine general medical examination at a health care facility 10/13/2012  . History of Mohs surgery for squamous cell carcinoma in situ of skin 01/28/2012  . Screening for colon cancer 01/28/2012  . Hyperlipidemia   . Hypertension     Past Surgical History  Procedure Laterality Date  . Cholecystectomy  2005  . Squamous cell carcinoma excision  2007    mole on face  . Mohs surgery  2007    squamou cell carcinoma rt nasolabial fold    Current Outpatient Rx  Name  Route  Sig  Dispense  Refill  . aspirin 81 MG tablet   Oral   Take 81 mg by mouth daily.         . Calcium Carbonate-Vit D-Min (CALTRATE 600+D PLUS MINERALS) 600-800 MG-UNIT TABS   Oral   Take 1 tablet by mouth daily. Taking 1 tablet every other day         . Diclofenac Sodium (PENNSAID) 2 % SOLN   Transdermal   Place 2 application onto the skin 2 (two) times daily. Patient taking differently: Place 2 application onto the skin 2 (two) times daily as needed.    112 g   3   . ESTRACE VAGINAL 0.1 MG/GM vaginal cream      USE [id number] APPLICATORFUL VAGINALLY TWICE WEEKLY   42.5 g   2   . fish oil-omega-3 fatty acids 1000 MG capsule   Oral   Take 2 g by mouth daily.         Marland Kitchen LORazepam (ATIVAN) 1 MG tablet   Oral   Take 1 tablet (1 mg  total) by mouth every 8 (eight) hours as needed for anxiety.   20 tablet   0   . losartan (COZAAR) 100 MG tablet   Oral   Take 1 tablet (100 mg total) by mouth daily.   90 tablet   1   . predniSONE (DELTASONE) 50 MG tablet      1 tablet daily x 5 days.   5 tablet   0   . simvastatin (ZOCOR) 20 MG tablet   Oral   Take 1 tablet (20 mg total) by mouth every other day.   90 tablet   2   . zolpidem (AMBIEN) 10 MG tablet   Oral   Take 0.5 tablets (5 mg total) by mouth at bedtime as needed.   45 tablet   3   . zoster vaccine live, PF, (ZOSTAVAX) 16109 UNT/0.65ML injection   Subcutaneous   Inject 19,400 Units into the skin once.   1 each   0      Allergies Ace inhibitors  Family History  Problem Relation Age of Onset  . Hypertension Mother   . Diabetes Mother     type 2  . Stroke Mother 24    massive, with aphasia and paraplegia  . Hypertension Father   . Hyperlipidemia Father   . Heart attack Father     vs PE during hospitalization for chest pain   . Heart disease Father   . Heart attack Maternal Grandfather   . Heart disease Paternal Grandmother     CHF  . Heart disease Paternal Grandfather   . Heart attack Paternal Grandfather     Social History Social History  Substance Use Topics  . Smoking status: Never Smoker   . Smokeless tobacco: Never Used  . Alcohol Use: Yes     Comment: not much    Review of Systems  Constitutional: Negative for fever, chills, or sweats. Eyes: Positive for visual changes. Gastrointestinal: Negative for abdominal pain, vomiting and diarrhea. Neurological: Negative for headaches, focal weakness or numbness. ____________________________________________  PHYSICAL EXAM:  VITAL SIGNS: ED Triage Vitals  Enc Vitals Group     BP 05/19/15 0820 125/52 mmHg     Pulse Rate 05/19/15 0820 81     Resp 05/19/15 0820 18     Temp 05/19/15 0820 98 F (36.7 C)     Temp Source 05/19/15 0820 Oral     SpO2 05/19/15 0820 100 %     Weight 05/19/15 0820 144 lb (65.318 kg)     Height 05/19/15 0820 5\' 5"  (1.651 m)     Head Cir --      Peak Flow --      Pain Score --      Pain Loc --      Pain Edu? --      Excl. in [place]? --    Constitutional: Alert and oriented. Well appearing and in no distress. Head: Normocephalic and atraumatic. Eyes: Conjunctivae are normal. PERRL. Normal extraocular movements. Normal fundi bilaterally without hemorrhage, exudates, AV changes, or vitreous floaters. Normal otic dis margins. No papilledema noted. No visible retinal deformity. Exam not performed with dilation.  Hematological/Lymphatic/Immunological: No cervical or preauricular lymphadenopathy. Respiratory:  Normal respiratory effort.  Musculoskeletal: Nontender with normal range of motion in all extremities.  Neurologic:  Normal gait without ataxia. Normal speech and language. No gross focal neurologic deficits are appreciated. Skin:  Skin is warm, dry and intact. No rash noted. ____________________________________________  PROCEDURES  Visual Acuity  Right Eye Distance:  20/50 Left Eye Distance: 20/40 ____________________________________________  INITIAL IMPRESSION / ASSESSMENT AND PLAN / ED COURSE  ----------------------------------------- 9:36 AM on 05/19/2015 ----------------------------------------- Discussed the case with Dr. Sherie Don. He put migraine aura in the differential along with my assessment of either idiopathic vitreous floaters or some early sign of posterior vitreous detachment. Since patients symptoms were fleeting and self-limited, he suggests evaluation in his office next week.  Patient will be discharged without restrictions to activity. She is reassured by the exam, and verbalizes understanding of follow-up care. She should return to the ED, as discussed for any acute changes in the interim.  ____________________________________________  FINAL CLINICAL IMPRESSION(S) / ED DIAGNOSES  Final diagnoses:  Photopsia      Melvenia Needles, PA-C 05/20/15 0741  Lisa Roca, MD 05/20/15 [phone]

## 2015-05-19 NOTE — ED Notes (Signed)
Pt states she awake this morning and went to use her computer and states she felt like something was in her L eye and "[she] couldn't see around it". Pt states hx of " a small hemorrhage in the back of her L eye that resolved on it's own". Pt denies injury or trauma, pt states since then the problem has resolved.

## 2015-05-19 NOTE — ED Notes (Signed)
Discussed discharge instructions and follow-up care with patient. No questions or concerns at this time. Pt stable at discharge.  

## 2015-05-19 NOTE — Discharge Instructions (Signed)
Visual Disturbances You have had a disturbance in your vision. This may be caused by various conditions, such as:  Migraines. Migraine headaches are often preceded by a disturbance in vision. Blind spots or light flashes are followed by a headache. This type of visual disturbance is temporary. It does not damage the eye.  Glaucoma. This is caused by increased pressure in the eye. Symptoms include haziness, blurred vision, or seeing rainbow colored circles when looking at bright lights. Partial or complete visual loss can occur. You may or may not experience eye pain. Visual loss may be gradual or sudden and is irreversible. Glaucoma is the leading cause of blindness.  Retina problems. Vision will be reduced if the retina becomes detached or if there is a circulation problem as with diabetes, high blood pressure, or a mini-stroke. Symptoms include seeing "floaters," flashes of light, or shadows, as if a curtain has fallen over your eye.  Optic nerve problems. The main nerve in your eye can be damaged by redness, soreness, and swelling (inflammation), poor circulation, drugs, and toxins. It is very important to have a complete exam done by a specialist to determine the exact cause of your eye problem. The specialist may recommend medicines or surgery, depending on the cause of the problem. This can help prevent further loss of vision or reduce the risk of having a stroke. Contact the caregiver to whom you have been referred and arrange for follow-up care right away. SEEK IMMEDIATE MEDICAL CARE IF:   Your vision gets worse.  You develop severe headaches.  You have any weakness or numbness in the face, arms, or legs.  You have any trouble speaking or walking.   This information is not intended to replace advice given to you by your health care provider. Make sure you discuss any questions you have with your health care provider.   Document Released: 02/14/2004 Document Revised: 03/31/2011 Document  Reviewed: 06/15/2013 Elsevier Interactive Patient Education 2016 [employer].  Call Dr. Larrie Kass office on Monday for an appointment. He will evaluate your further. Thank you for coming in, please return if needed.

## 2015-05-23 DIAGNOSIS — H43812 Vitreous degeneration, left eye: Secondary | ICD-10-CM | POA: Diagnosis not present

## 2015-06-25 ENCOUNTER — Other Ambulatory Visit: Payer: Self-pay | Admitting: Internal Medicine

## 2015-06-27 ENCOUNTER — Telehealth: Payer: Self-pay | Admitting: Internal Medicine

## 2015-06-27 DIAGNOSIS — H43812 Vitreous degeneration, left eye: Secondary | ICD-10-CM | POA: Diagnosis not present

## 2015-06-27 DIAGNOSIS — Z79899 Other long term (current) drug therapy: Secondary | ICD-10-CM

## 2015-06-27 NOTE — Telephone Encounter (Signed)
zolpidem (AMBIEN) 10 MG tablet  Send

## 2015-06-28 MED ORDER — ZOLPIDEM TARTRATE 10 MG PO TABS: 5.0000 mg | ORAL_TABLET | Freq: Every evening | ORAL | Status: DC | PRN

## 2015-06-28 NOTE — Telephone Encounter (Signed)
Left message for patient to cll office script faxed Patient needs lab appointment.

## 2015-06-28 NOTE — Telephone Encounter (Signed)
Yes but she is overdue for labs.  Needs cmet,.  Takes simvastatin . Marland Kitchen90 days only on the [place] needs appt

## 2015-06-28 NOTE — Telephone Encounter (Signed)
zolpidem (AMBIEN) 10 MG tablet Last fill 10/25/14 Last office visit with Dr Lacinda Axon 04/16/15 Okay to fill?

## 2015-07-25 ENCOUNTER — Other Ambulatory Visit (INDEPENDENT_AMBULATORY_CARE_PROVIDER_SITE_OTHER): Payer: BLUE CROSS/BLUE SHIELD

## 2015-07-25 DIAGNOSIS — Z79899 Other long term (current) drug therapy: Secondary | ICD-10-CM | POA: Diagnosis not present

## 2015-07-25 LAB — COMPREHENSIVE METABOLIC PANEL
ALT: 12 U/L (ref 0–35)
AST: 14 U/L (ref 0–37)
Albumin: 3.8 g/dL (ref 3.5–5.2)
Alkaline Phosphatase: 54 U/L (ref 39–117)
BUN: 11 mg/dL (ref 6–23)
CO2: 29 mEq/L (ref 19–32)
Calcium: 9.2 mg/dL (ref 8.4–10.5)
Chloride: 105 mEq/L (ref 96–112)
Creatinine, Ser: 0.84 mg/dL (ref 0.40–1.20)
GFR: 73.07 mL/min (ref >60.00)
Glucose, Bld: 91 mg/dL (ref 70–99)
Potassium: 4.1 mEq/L (ref 3.5–5.1)
Sodium: 139 mEq/L (ref 135–145)
Total Bilirubin: 0.4 mg/dL (ref 0.2–1.2)
Total Protein: 6.5 g/dL (ref 6.0–8.3)

## 2015-07-25 NOTE — Telephone Encounter (Signed)
Pt wanted to FYI that she had her lab appt completed today.

## 2015-07-25 NOTE — Telephone Encounter (Signed)
Noted, thanks I will let provider know.

## 2015-07-30 ENCOUNTER — Encounter: Payer: Self-pay | Admitting: *Deleted

## 2015-10-12 ENCOUNTER — Telehealth: Payer: Self-pay | Admitting: *Deleted

## 2015-10-12 NOTE — Telephone Encounter (Signed)
Yes since this is a different part of the body she will need a new referral.

## 2015-10-12 NOTE — Telephone Encounter (Signed)
Attempted to reach patient, left a VM to return my call  

## 2015-10-12 NOTE — Telephone Encounter (Signed)
Pt questioned if she would need a referral to see an orthopedic that she previously seen for her shoulder Pt currently has left thumb pain.  Pt contact [phone]

## 2015-10-12 NOTE — Telephone Encounter (Signed)
Patient has a prior referral to Dr. Tamala Julian and Dr. Therapy for shoulder issues, please advise if a new referral will be needed (I believe so) to handle left thumb pain?

## 2015-10-23 ENCOUNTER — Other Ambulatory Visit: Payer: Self-pay | Admitting: Internal Medicine

## 2015-10-23 MED ORDER — ZOLPIDEM TARTRATE 10 MG PO TABS: 5.0000 mg | ORAL_TABLET | Freq: Every evening | ORAL | 0 refills | Status: DC | PRN

## 2015-10-23 NOTE — Telephone Encounter (Signed)
Refilled,  Please schedule OV  In he next month

## 2015-10-23 NOTE — Telephone Encounter (Signed)
Refilled 06/28/15. Pt last seen 10/25/14 where the diagnosis was discussed with PCP. Please advise?

## 2015-10-23 NOTE — Telephone Encounter (Signed)
Left voicemail stating to callback and schedule follow up with Dr.Tullo.

## 2015-10-24 NOTE — Telephone Encounter (Signed)
Attempted to reach for a second time to review why she needs the referral, left VM. thanks

## 2015-10-26 ENCOUNTER — Telehealth: Payer: Self-pay | Admitting: Internal Medicine

## 2015-10-26 NOTE — Telephone Encounter (Signed)
Pt called and stated that she got a call from [facility] saying that her prescription was ready. Pt wants to know if it can be undone and sent to [place], [place] because it is cheaper for her. Please advise, thank you!  Call pt @ [phone]

## 2015-10-29 NOTE — Telephone Encounter (Signed)
Medication has been faxed

## 2015-11-05 ENCOUNTER — Telehealth: Payer: Self-pay | Admitting: Internal Medicine

## 2015-11-05 NOTE — Telephone Encounter (Signed)
Left detailed message medication has been faxed as requested.

## 2015-11-05 NOTE — Telephone Encounter (Signed)
Pt called about her medication that has not been filled as of yet. Medication is zolpidem (AMBIEN) 10 MG tablet. Pt called on 10/26/15.  Pharmacy is [facility], [place]  Call pt @ [phone]. Thank you!

## 2015-11-19 ENCOUNTER — Ambulatory Visit (INDEPENDENT_AMBULATORY_CARE_PROVIDER_SITE_OTHER): Payer: BLUE CROSS/BLUE SHIELD | Admitting: Internal Medicine

## 2015-11-19 ENCOUNTER — Encounter: Payer: Self-pay | Admitting: Internal Medicine

## 2015-11-19 ENCOUNTER — Ambulatory Visit (INDEPENDENT_AMBULATORY_CARE_PROVIDER_SITE_OTHER): Payer: BLUE CROSS/BLUE SHIELD

## 2015-11-19 VITALS — BP 144/80 | HR 94 | Wt 146.0 lb

## 2015-11-19 DIAGNOSIS — M79645 Pain in left finger(s): Secondary | ICD-10-CM | POA: Diagnosis not present

## 2015-11-19 DIAGNOSIS — M19049 Primary osteoarthritis, unspecified hand: Secondary | ICD-10-CM

## 2015-11-19 DIAGNOSIS — M810 Age-related osteoporosis without current pathological fracture: Secondary | ICD-10-CM

## 2015-11-19 DIAGNOSIS — F5104 Psychophysiologic insomnia: Secondary | ICD-10-CM

## 2015-11-19 DIAGNOSIS — Z0000 Encounter for general adult medical examination without abnormal findings: Secondary | ICD-10-CM | POA: Diagnosis not present

## 2015-11-19 DIAGNOSIS — E559 Vitamin D deficiency, unspecified: Secondary | ICD-10-CM | POA: Diagnosis not present

## 2015-11-19 DIAGNOSIS — G8929 Other chronic pain: Secondary | ICD-10-CM

## 2015-11-19 DIAGNOSIS — R5383 Other fatigue: Secondary | ICD-10-CM

## 2015-11-19 DIAGNOSIS — M19042 Primary osteoarthritis, left hand: Secondary | ICD-10-CM | POA: Diagnosis not present

## 2015-11-19 DIAGNOSIS — I10 Essential (primary) hypertension: Secondary | ICD-10-CM | POA: Diagnosis not present

## 2015-11-19 DIAGNOSIS — E782 Mixed hyperlipidemia: Secondary | ICD-10-CM | POA: Diagnosis not present

## 2015-11-19 NOTE — Progress Notes (Signed)
Patient ID: Amber Sullivan, female    DOB: 04-30-1955  Age: 60 y.o. MRN: [id number]  The patient is here for annual  wellness examination and management of other chronic and acute problems.  Normal PAP 2016 Normal mammogram 2016 [facility] in [place] scan 2016 osteoporosis   NO PRIOR COLONOSCOPY.  HAD 2 FREIDN WITH PERFORATIONS,  AND HER ONLY SCHEDULED ONE WAS CANCELLED WHEN DR [id number] LEFT THE PRACTICE.   DISCUSSED COLOGUARD.  [facility]mc left thumb,  Worse with gardening,  Some with piano   Exercises regular including yoga ,  Does ont want to start  [place] family history    The risk factors are reflected in the social history.  The roster of all physicians providing medical care to patient - is listed in the Snapshot section of the chart.  Home safety : The patient has smoke detectors in the home. They wear seatbelts.  There are no firearms at home. There is no violence in the home.   There is no risks for hepatitis, STDs or HIV. There is no   history of blood transfusion. They have no travel history to infectious disease endemic areas of the world.  The patient has seen their dentist in the last six month. T   They have deferred audiologic testing in the last year.  They do not  have excessive sun exposure. Discussed the need for sun protection: hats, long sleeves and use of sunscreen if there is significant sun exposure.   Diet: the importance of a healthy diet is discussed. They do have a healthy diet.  The benefits of regular aerobic exercise were discussed. She walks 4 times per week ,  20 minutes.   Depression screen: there are no signs or vegative symptoms of depression- irritability, change in appetite, anhedonia, sadness/tearfullness.  The following portions of the patient's history were reviewed and updated as appropriate: allergies, current medications, past family history, past medical history,  past surgical history, past social history  and problem  list.  Visual acuity was not assessed per patient preference since she has regular follow up with her ophthalmologist. Hearing and body mass index were assessed and reviewed.   During the course of the visit the patient was educated and counseled about appropriate screening and preventive services including : fall prevention , diabetes screening, nutrition counseling, colorectal cancer screening, and recommended immunizations.    CC: The primary encounter diagnosis was Chronic thumb pain, left. Diagnoses of Fatigue, unspecified type, Mixed hyperlipidemia, Vitamin D deficiency, CMC arthritis, Essential hypertension, Osteoporosis without current pathological fracture, unspecified osteoporosis type, Chronic insomnia, and Encounter for preventive health examination were also pertinent to this visit.  History Amber Sullivan has a past medical history of Hyperlipidemia and Hypertension.   She has a past surgical history that includes Cholecystectomy (2005); Squamous cell carcinoma excision (2007); and Mohs surgery (2007).   Her family history includes Diabetes in her mother; Heart attack in her father, maternal grandfather, and paternal grandfather; Heart disease in her father, paternal grandfather, and paternal grandmother; Hyperlipidemia in her father; Hypertension in her father and mother; Stroke (age of onset: 94) in her mother.She reports that she has never smoked. She has never used smokeless tobacco. She reports that she drinks alcohol. She reports that she does not use drugs.  Outpatient Medications Prior to Visit  Medication Sig Dispense Refill  . aspirin 81 MG tablet Take 81 mg by mouth daily.    . Calcium Carbonate-Vit D-Min (CALTRATE 600+D PLUS MINERALS) 600-800 MG-UNIT TABS  Take 1 tablet by mouth daily. Taking 1 tablet every other day    . Diclofenac Sodium (PENNSAID) 2 % SOLN Place 2 application onto the skin 2 (two) times daily. (Patient taking differently: Place 2 application onto the skin 2  (two) times daily as needed. ) 112 g 3  . fish oil-omega-3 fatty acids 1000 MG capsule Take 2 g by mouth daily.    Marland Kitchen LORazepam (ATIVAN) 1 MG tablet Take 1 tablet (1 mg total) by mouth every 8 (eight) hours as needed for anxiety. 20 tablet 0  . simvastatin (ZOCOR) 20 MG tablet Take 1 tablet (20 mg total) by mouth every other day. 90 tablet 2  . zolpidem (AMBIEN) 10 MG tablet Take 0.5 tablets (5 mg total) by mouth at bedtime as needed. 45 tablet 0  . zoster vaccine live, PF, (ZOSTAVAX) 60454 UNT/0.65ML injection Inject 19,400 Units into the skin once. 1 each 0  . losartan (COZAAR) 100 MG tablet TAKE 1 TABLET DAILY 90 tablet 1  . ESTRACE VAGINAL 0.1 MG/GM vaginal cream USE [id number] APPLICATORFUL VAGINALLY TWICE WEEKLY (Patient not taking: Reported on 11/19/2015) 42.5 g 2  . predniSONE (DELTASONE) 50 MG tablet 1 tablet daily x 5 days. (Patient not taking: Reported on 11/19/2015) 5 tablet 0   No facility-administered medications prior to visit.     Review of Systems   Patient denies headache, fevers, malaise, unintentional weight loss, skin rash, eye pain, sinus congestion and sinus pain, sore throat, dysphagia,  hemoptysis , cough, dyspnea, wheezing, chest pain, palpitations, orthopnea, edema, abdominal pain, nausea, melena, diarrhea, constipation, flank pain, dysuria, hematuria, urinary  Frequency, nocturia, numbness, tingling, seizures,  Focal weakness, Loss of consciousness,  Tremor, insomnia, depression, anxiety, and suicidal ideation.      Objective:  BP (!) 144/80   Pulse 94   Wt 146 lb (66.2 kg)   SpO2 96%   BMI 24.30 kg/m   Physical Exam  General appearance: alert, cooperative and appears stated age Head: Normocephalic, without obvious abnormality, atraumatic Eyes: conjunctivae/corneas clear. PERRL, EOM's intact. Fundi benign. Ears: normal TM's and external ear canals both ears Nose: Nares normal. Septum midline. Mucosa normal. No drainage or sinus tenderness. Throat: lips, mucosa,  and tongue normal; teeth and gums normal Neck: no adenopathy, no carotid bruit, no JVD, supple, symmetrical, trachea midline and thyroid not enlarged, symmetric, no tenderness/mass/nodules Lungs: clear to auscultation bilaterally Breasts: normal appearance, no masses or tenderness Heart: regular rate and rhythm, S1, S2 normal, no murmur, click, rub or gallop Abdomen: soft, non-tender; bowel sounds normal; no masses,  no organomegaly Extremities: extremities normal, atraumatic, no cyanosis or edema Pulses: 2+ and symmetric Skin: Skin color, texture, turgor normal. No rashes or lesions Neurologic: Alert and oriented X 3, normal strength and tone. Normal symmetric reflexes. Normal coordination and gait.    Assessment & Plan:   Problem List Items Addressed This Visit    Hyperlipidemia    Managed with low potency statin.  Marland Kitchenliver enzymes normal.  Lab Results  Component Value Date   CHOL 189 11/19/2015   HDL 61.50 11/19/2015   LDLCALC 109 (H) 11/19/2015   LDLDIRECT 109.0 10/25/2014   TRIG 89.0 11/19/2015   CHOLHDL 3 11/19/2015   . Lab Results  Component Value Date   ALT 12 07/25/2015   AST 14 07/25/2015   ALKPHOS 54 07/25/2015   BILITOT 0.4 07/25/2015         Relevant Orders   Lipid panel (Completed)   Hypertension    Well controlled  on current regimen. Renal function stable, no changes today.  Lab Results  Component Value Date   CREATININE 0.84 07/25/2015   Lab Results  Component Value Date   NA 139 07/25/2015   K 4.1 07/25/2015   CL 105 07/25/2015   CO2 29 07/25/2015         Encounter for preventive health examination    Annual comprehensive preventive exam was done as well as an evaluation and management of chronic conditions .  During the course of the visit the patient was educated and counseled about appropriate screening and preventive services including :  diabetes screening, lipid analysis with projected  10 year  risk for CAD , nutrition counseling,  breast, cervical and colorectal cancer screening, and recommended immunizations.  Printed recommendations for health maintenance screenings was given.      Chronic insomnia    Chronic, with no improvement using over-the-counter first generation antihistamines and melatonin, . The risks and benefits of continued use of [place]  Were reviewed  with patient today including excessive sedation leading to respiratory depression,  impaired thinking/driving, and addiction.  Patient was advised to avoid concurrent use with alcohol, to use medication only as needed and not to share with others  .       Osteoporosis    By June 2016 DEXA scan.  Risks and benefits of pharmacologic therapy were again discussed with patient and she has deferred therapy for now.  Discussed the current controversies surrounding the risks and benefits of calcium supplementation.  Encouraged her to increase dietary calcium through natural foods including almond/coconut milk .  Vitamin D level checked today was normal.       CMC arthritis    Suggested by history, plain films ordered.        Other Visit Diagnoses    Chronic thumb pain, left    -  Primary   Relevant Orders   Ambulatory referral to Sports Medicine   DG Finger Thumb Left (Completed)   Fatigue, unspecified type       Relevant Orders   Comprehensive metabolic panel   TSH (Completed)   CBC with Differential/Platelet (Completed)   Hepatitis C antibody (Completed)   HIV antibody (Completed)   Vitamin D deficiency       Relevant Orders   VITAMIN D 25 Hydroxy (Vit-D Deficiency, Fractures)      I have discontinued Ms. Kamau's ESTRACE VAGINAL and predniSONE. I am also having her maintain her aspirin, fish oil-omega-3 fatty acids, Diclofenac Sodium, CALTRATE 600+D PLUS MINERALS, zoster vaccine live (PF), LORazepam, simvastatin, and zolpidem.  No orders of the defined types were placed in this encounter.   Medications Discontinued During This Encounter   Medication Reason  . predniSONE (DELTASONE) 50 MG tablet Completed Course  . ESTRACE VAGINAL 0.1 MG/GM vaginal cream Completed Course    Follow-up: No Follow-up on file.   Crecencio Mc, MD

## 2015-11-19 NOTE — Patient Instructions (Signed)
Consider Evista and Prolia as alternative therapies for osteoporosis  Consider Cologuard for colon CA screening  I need your last mammogram result !  Menopause is a normal process in which your reproductive ability comes to an end. This process happens gradually over a span of months to years, usually between the ages of 74 and 38. Menopause is complete when you have missed 12 consecutive menstrual periods. It is important to talk with your health care provider about some of the most common conditions that affect postmenopausal women, such as heart disease, cancer, and bone loss (osteoporosis). Adopting a healthy lifestyle and getting preventive care can help to promote your health and wellness. Those actions can also lower your chances of developing some of these common conditions. WHAT SHOULD I KNOW ABOUT MENOPAUSE? During menopause, you may experience a number of symptoms, such as:  Moderate-to-severe hot flashes.  Night sweats.  Decrease in sex drive.  Mood swings.  Headaches.  Tiredness.  Irritability.  Memory problems.  Insomnia. Choosing to treat or not to treat menopausal changes is an individual decision that you make with your health care provider. WHAT SHOULD I KNOW ABOUT HORMONE REPLACEMENT THERAPY AND SUPPLEMENTS? Hormone therapy products are effective for treating symptoms that are associated with menopause, such as hot flashes and night sweats. Hormone replacement carries certain risks, especially as you become older. If you are thinking about using estrogen or estrogen with progestin treatments, discuss the benefits and risks with your health care provider. WHAT SHOULD I KNOW ABOUT HEART DISEASE AND STROKE? Heart disease, heart attack, and stroke become more likely as you age. This may be due, in part, to the hormonal changes that your body experiences during menopause. These can affect how your body processes dietary fats, triglycerides, and cholesterol. Heart attack  and stroke are both medical emergencies. There are many things that you can do to help prevent heart disease and stroke:  Have your blood pressure checked at least every 1-2 years. High blood pressure causes heart disease and increases the risk of stroke.  If you are 45-37 years old, ask your health care provider if you should take aspirin to prevent a heart attack or a stroke.  Do not use any tobacco products, including cigarettes, chewing tobacco, or electronic cigarettes. If you need help quitting, ask your health care provider.  It is important to eat a healthy diet and maintain a healthy weight.  Be sure to include plenty of vegetables, fruits, low-fat dairy products, and lean protein.  Avoid eating foods that are high in solid fats, added sugars, or salt (sodium).  Get regular exercise. This is one of the most important things that you can do for your health.  Try to exercise for at least 150 minutes each week. The type of exercise that you do should increase your heart rate and make you sweat. This is known as moderate-intensity exercise.  Try to do strengthening exercises at least twice each week. Do these in addition to the moderate-intensity exercise.  Know your numbers.Ask your health care provider to check your cholesterol and your blood glucose. Continue to have your blood tested as directed by your health care provider. WHAT SHOULD I KNOW ABOUT CANCER SCREENING? There are several types of cancer. Take the following steps to reduce your risk and to catch any cancer development as early as possible. Breast Cancer  Practice breast self-awareness.  This means understanding how your breasts normally appear and feel.  It also means doing regular breast self-exams.  Let your health care provider know about any changes, no matter how small.  If you are 43 or older, have a clinician do a breast exam (clinical breast exam or CBE) every year. Depending on your age, family history,  and medical history, it may be recommended that you also have a yearly breast X-ray (mammogram).  If you have a family history of breast cancer, talk with your health care provider about genetic screening.  If you are at high risk for breast cancer, talk with your health care provider about having an MRI and a mammogram every year.  Breast cancer (BRCA) gene test is recommended for women who have family members with BRCA-related cancers. Results of the assessment will determine the need for genetic counseling and BRCA1 and for BRCA2 testing. BRCA-related cancers include these types:  Breast. This occurs in males or females.  Ovarian.  Tubal. This may also be called fallopian tube cancer.  Cancer of the abdominal or pelvic lining (peritoneal cancer).  Prostate.  Pancreatic. Cervical, Uterine, and Ovarian Cancer Your health care provider may recommend that you be screened regularly for cancer of the pelvic organs. These include your ovaries, uterus, and vagina. This screening involves a pelvic exam, which includes checking for microscopic changes to the surface of your cervix (Pap test).  For women ages 21-65, health care providers may recommend a pelvic exam and a Pap test every three years. For women ages 4-65, they may recommend the Pap test and pelvic exam, combined with testing for human papilloma virus (HPV), every five years. Some types of HPV increase your risk of cervical cancer. Testing for HPV may also be done on women of any age who have unclear Pap test results.  Other health care providers may not recommend any screening for nonpregnant women who are considered low risk for pelvic cancer and have no symptoms. Ask your health care provider if a screening pelvic exam is right for you.  If you have had past treatment for cervical cancer or a condition that could lead to cancer, you need Pap tests and screening for cancer for at least 20 years after your treatment. If Pap tests  have been discontinued for you, your risk factors (such as having a new sexual partner) need to be reassessed to determine if you should start having screenings again. Some women have medical problems that increase the chance of getting cervical cancer. In these cases, your health care provider may recommend that you have screening and Pap tests more often.  If you have a family history of uterine cancer or ovarian cancer, talk with your health care provider about genetic screening.  If you have vaginal bleeding after reaching menopause, tell your health care provider.  There are currently no reliable tests available to screen for ovarian cancer. Lung Cancer Lung cancer screening is recommended for adults 45-70 years old who are at high risk for lung cancer because of a history of smoking. A yearly low-dose CT scan of the lungs is recommended if you:  Currently smoke.  Have a history of at least 30 pack-years of smoking and you currently smoke or have quit within the past 15 years. A pack-year is smoking an average of one pack of cigarettes per day for one year. Yearly screening should:  Continue until it has been 15 years since you quit.  Stop if you develop a health problem that would prevent you from having lung cancer treatment. Colorectal Cancer  This type of cancer can be  detected and can often be prevented.  Routine colorectal cancer screening usually begins at age 77 and continues through age 76.  If you have risk factors for colon cancer, your health care provider may recommend that you be screened at an earlier age.  If you have a family history of colorectal cancer, talk with your health care provider about genetic screening.  Your health care provider may also recommend using home test kits to check for hidden blood in your stool.  A small camera at the end of a tube can be used to examine your colon directly (sigmoidoscopy or colonoscopy). This is done to check for the  earliest forms of colorectal cancer.  Direct examination of the colon should be repeated every 5-10 years until age 77. However, if early forms of precancerous polyps or small growths are found or if you have a family history or genetic risk for colorectal cancer, you may need to be screened more often. Skin Cancer  Check your skin from head to toe regularly.  Monitor any moles. Be sure to tell your health care provider:  About any new moles or changes in moles, especially if there is a change in a mole's shape or color.  If you have a mole that is larger than the size of a pencil eraser.  If any of your family members has a history of skin cancer, especially at a young age, talk with your health care provider about genetic screening.  Always use sunscreen. Apply sunscreen liberally and repeatedly throughout the day.  Whenever you are outside, protect yourself by wearing long sleeves, pants, a wide-brimmed hat, and sunglasses. WHAT SHOULD I KNOW ABOUT OSTEOPOROSIS? Osteoporosis is a condition in which bone destruction happens more quickly than new bone creation. After menopause, you may be at an increased risk for osteoporosis. To help prevent osteoporosis or the bone fractures that can happen because of osteoporosis, the following is recommended:  If you are 27-55 years old, get at least 1,000 mg of calcium and at least 600 mg of vitamin D per day.  If you are older than age 77 but younger than age 87, get at least 1,200 mg of calcium and at least 600 mg of vitamin D per day.  If you are older than age 55, get at least 1,200 mg of calcium and at least 800 mg of vitamin D per day. Smoking and excessive alcohol intake increase the risk of osteoporosis. Eat foods that are rich in calcium and vitamin D, and do weight-bearing exercises several times each week as directed by your health care provider. WHAT SHOULD I KNOW ABOUT HOW MENOPAUSE AFFECTS [place]? Depression may occur at any  age, but it is more common as you become older. Common symptoms of depression include:  Low or sad mood.  Changes in sleep patterns.  Changes in appetite or eating patterns.  Feeling an overall lack of motivation or enjoyment of activities that you previously enjoyed.  Frequent crying spells. Talk with your health care provider if you think that you are experiencing depression. WHAT SHOULD I KNOW ABOUT IMMUNIZATIONS? It is important that you get and maintain your immunizations. These include:  Tetanus, diphtheria, and pertussis (Tdap) booster vaccine.  Influenza every year before the flu season begins.  Pneumonia vaccine.  Shingles vaccine. Your health care provider may also recommend other immunizations.   This information is not intended to replace advice given to you by your health care provider. Make sure you discuss any  questions you have with your health care provider.   Document Released: 02/28/2005 Document Revised: 01/27/2014 Document Reviewed: 09/08/2013 Elsevier Interactive Patient Education [employer].

## 2015-11-20 ENCOUNTER — Other Ambulatory Visit: Payer: Self-pay | Admitting: Internal Medicine

## 2015-11-20 LAB — CBC WITH DIFFERENTIAL/PLATELET
Basophils Absolute: 0 10*3/uL (ref 0.0–0.1)
Basophils Relative: 0.5 % (ref 0.0–3.0)
Eosinophils Absolute: 0 10*3/uL (ref 0.0–0.7)
Eosinophils Relative: 0.7 % (ref 0.0–5.0)
HCT: 40.7 % (ref 36.0–46.0)
Hemoglobin: 13.6 g/dL (ref 12.0–15.0)
Lymphocytes Relative: 33.3 % (ref 12.0–46.0)
Lymphs Abs: 2.1 10*3/uL (ref 0.7–4.0)
MCHC: 33.4 g/dL (ref 30.0–36.0)
MCV: 91.7 fl (ref 78.0–100.0)
Monocytes Absolute: 0.4 10*3/uL (ref 0.1–1.0)
Monocytes Relative: 6.2 % (ref 3.0–12.0)
Neutro Abs: 3.7 10*3/uL (ref 1.4–7.7)
Neutrophils Relative %: 59.3 % (ref 43.0–77.0)
Platelets: 208 10*3/uL (ref 150.0–400.0)
RBC: 4.43 Mil/uL (ref 3.87–5.11)
RDW: 14 % (ref 11.5–15.5)
WBC: 6.3 10*3/uL (ref 4.0–10.5)

## 2015-11-20 LAB — LIPID PANEL
Cholesterol: 189 mg/dL (ref 0–200)
HDL: 61.5 mg/dL (ref >39.00)
LDL Cholesterol: 109 mg/dL — ABNORMAL HIGH (ref 0–99)
NonHDL: 127.06
Total CHOL/HDL Ratio: 3
Triglycerides: 89 mg/dL (ref 0.0–149.0)
VLDL: 17.8 mg/dL (ref 0.0–40.0)

## 2015-11-20 LAB — HIV ANTIBODY (ROUTINE TESTING W REFLEX): HIV 1&2 Ab, 4th Generation: NONREACTIVE

## 2015-11-20 LAB — HEPATITIS C ANTIBODY: HCV Ab: NEGATIVE

## 2015-11-20 LAB — TSH: TSH: 2.07 u[IU]/mL (ref 0.35–4.50)

## 2015-11-20 NOTE — Assessment & Plan Note (Signed)
By June 2016 DEXA scan.  Risks and benefits of pharmacologic therapy were again discussed with patient and she has deferred therapy for now.  Discussed the current controversies surrounding the risks and benefits of calcium supplementation.  Encouraged her to increase dietary calcium through natural foods including almond/coconut milk .  Vitamin D level checked today was normal.

## 2015-11-20 NOTE — Assessment & Plan Note (Signed)
Chronic, with no improvement using over-the-counter first generation antihistamines and melatonin, . The risks and benefits of continued use of [place]  Were reviewed  with patient today including excessive sedation leading to respiratory depression,  impaired thinking/driving, and addiction.  Patient was advised to avoid concurrent use with alcohol, to use medication only as needed and not to share with others  .

## 2015-11-20 NOTE — Assessment & Plan Note (Signed)
Well controlled on current regimen. Renal function stable, no changes today.  Lab Results  Component Value Date   CREATININE 0.84 07/25/2015   Lab Results  Component Value Date   NA 139 07/25/2015   K 4.1 07/25/2015   CL 105 07/25/2015   CO2 29 07/25/2015

## 2015-11-20 NOTE — Assessment & Plan Note (Signed)
Managed with low potency statin.  Marland Kitchenliver enzymes normal.  Lab Results  Component Value Date   CHOL 189 11/19/2015   HDL 61.50 11/19/2015   LDLCALC 109 (H) 11/19/2015   LDLDIRECT 109.0 10/25/2014   TRIG 89.0 11/19/2015   CHOLHDL 3 11/19/2015   . Lab Results  Component Value Date   ALT 12 07/25/2015   AST 14 07/25/2015   ALKPHOS 54 07/25/2015   BILITOT 0.4 07/25/2015

## 2015-11-20 NOTE — Assessment & Plan Note (Signed)
Suggested by history, plain films ordered.

## 2015-11-20 NOTE — Assessment & Plan Note (Signed)
Annual comprehensive preventive exam was done as well as an evaluation and management of chronic conditions .  During the course of the visit the patient was educated and counseled about appropriate screening and preventive services including :  diabetes screening, lipid analysis with projected  10 year  risk for CAD , nutrition counseling, breast, cervical and colorectal cancer screening, and recommended immunizations.  Printed recommendations for health maintenance screenings was given 

## 2015-11-21 ENCOUNTER — Encounter: Payer: Self-pay | Admitting: *Deleted

## 2015-11-21 ENCOUNTER — Encounter: Payer: Self-pay | Admitting: Internal Medicine

## 2015-11-28 ENCOUNTER — Encounter: Payer: Self-pay | Admitting: *Deleted

## 2016-01-02 ENCOUNTER — Telehealth: Payer: Self-pay | Admitting: *Deleted

## 2016-01-02 NOTE — Telephone Encounter (Signed)
FYI patient stated that she had a mammogram on 04/04/15 , [place] [facility] sent the report to this office on  04/05/15.

## 2016-01-02 NOTE — Telephone Encounter (Signed)
Noted chart updated

## 2016-03-13 ENCOUNTER — Telehealth: Payer: Self-pay | Admitting: *Deleted

## 2016-03-13 DIAGNOSIS — Z79899 Other long term (current) drug therapy: Secondary | ICD-10-CM

## 2016-03-13 MED ORDER — ZOLPIDEM TARTRATE 10 MG PO TABS: 5.0000 mg | ORAL_TABLET | Freq: Every evening | ORAL | 0 refills | Status: DC | PRN

## 2016-03-13 NOTE — Telephone Encounter (Signed)
Refilled, but remind her that she will be due for  6 month follow up and non fasting labs due to simvastatin use.  Labs ordered  Non fasting ok.

## 2016-03-13 NOTE — Telephone Encounter (Signed)
Left mess for patient to call back.  

## 2016-03-13 NOTE — Telephone Encounter (Signed)
LOV 11/19/15 NO existing appts Zolpidem LF: 10/23/15 # 45 w/ 0 Rf.  Ok to Rf?

## 2016-03-13 NOTE — Telephone Encounter (Signed)
Pt insurance will not cover colon guard, however pt requested to continue with this order she will use her flex spend. Pt requested a medication refill for zolpidem [facility]

## 2016-03-14 NOTE — Telephone Encounter (Signed)
Pt informed of below.   Pt states she wants to proceed with Cologaurd order. Order faxed to [facility].   She is requesting zolpidem to go to [facility]. Signed Rx faxed to [facility]

## 2016-04-13 ENCOUNTER — Other Ambulatory Visit: Payer: Self-pay | Admitting: Internal Medicine

## 2016-04-18 ENCOUNTER — Other Ambulatory Visit: Payer: BLUE CROSS/BLUE SHIELD

## 2016-04-20 DIAGNOSIS — R195 Other fecal abnormalities: Secondary | ICD-10-CM

## 2016-04-20 DIAGNOSIS — Z1211 Encounter for screening for malignant neoplasm of colon: Secondary | ICD-10-CM | POA: Diagnosis not present

## 2016-04-20 DIAGNOSIS — Z1212 Encounter for screening for malignant neoplasm of rectum: Secondary | ICD-10-CM | POA: Diagnosis not present

## 2016-04-20 HISTORY — DX: Other fecal abnormalities: R19.5

## 2016-04-20 LAB — COLOGUARD

## 2016-04-21 ENCOUNTER — Other Ambulatory Visit (INDEPENDENT_AMBULATORY_CARE_PROVIDER_SITE_OTHER): Payer: BLUE CROSS/BLUE SHIELD

## 2016-04-21 DIAGNOSIS — E559 Vitamin D deficiency, unspecified: Secondary | ICD-10-CM

## 2016-04-21 DIAGNOSIS — Z79899 Other long term (current) drug therapy: Secondary | ICD-10-CM

## 2016-04-21 LAB — COMPREHENSIVE METABOLIC PANEL
ALT: 13 U/L (ref 0–35)
AST: 17 U/L (ref 0–37)
Albumin: 3.9 g/dL (ref 3.5–5.2)
Alkaline Phosphatase: 57 U/L (ref 39–117)
BUN: 13 mg/dL (ref 6–23)
CO2: 22 mEq/L (ref 19–32)
Calcium: 9.4 mg/dL (ref 8.4–10.5)
Chloride: 107 mEq/L (ref 96–112)
Creatinine, Ser: 0.81 mg/dL (ref 0.40–1.20)
GFR: 76.02 mL/min (ref >60.00)
Glucose, Bld: 89 mg/dL (ref 70–99)
Potassium: 4.6 mEq/L (ref 3.5–5.1)
Sodium: 140 mEq/L (ref 135–145)
Total Bilirubin: 0.5 mg/dL (ref 0.2–1.2)
Total Protein: 6.5 g/dL (ref 6.0–8.3)

## 2016-04-21 LAB — VITAMIN D 25 HYDROXY (VIT D DEFICIENCY, FRACTURES): VITD: 35.8 ng/mL (ref 30.00–100.00)

## 2016-04-22 DIAGNOSIS — Z1231 Encounter for screening mammogram for malignant neoplasm of breast: Secondary | ICD-10-CM | POA: Diagnosis not present

## 2016-04-22 LAB — HM MAMMOGRAPHY

## 2016-04-24 ENCOUNTER — Ambulatory Visit: Payer: BLUE CROSS/BLUE SHIELD | Admitting: Internal Medicine

## 2016-04-28 ENCOUNTER — Telehealth: Payer: Self-pay | Admitting: Internal Medicine

## 2016-04-28 DIAGNOSIS — R195 Other fecal abnormalities: Secondary | ICD-10-CM | POA: Insufficient documentation

## 2016-04-28 HISTORY — DX: Other fecal abnormalities: R19.5

## 2016-04-28 NOTE — Telephone Encounter (Signed)
Left voice mail to call back 

## 2016-04-28 NOTE — Telephone Encounter (Signed)
The results of patient's cologuard is  Positive. This may indicate a polyp somewhere in the colon.  I  would like to refer her to GI for further evaluation .    Is she willing to see one and does she have a preference?  

## 2016-04-28 NOTE — Telephone Encounter (Signed)
Voicemail; please call patient at [phone]

## 2016-04-28 NOTE — Assessment & Plan Note (Signed)
cologaurd was positive April 2018  Referral in process

## 2016-04-29 NOTE — Telephone Encounter (Signed)
Left voice mail to call back 

## 2016-04-29 NOTE — Telephone Encounter (Signed)
  Your referral is in process to dr Job Founds. our referral coordinator will call you when the appointment has been made.  If you do not hear from [facility] in our office in a week,  Please call us back

## 2016-04-29 NOTE — Telephone Encounter (Signed)
Patient advised of below and would like to go to whoever you recommend.

## 2016-04-30 ENCOUNTER — Telehealth: Payer: Self-pay | Admitting: General Surgery

## 2016-04-30 NOTE — Telephone Encounter (Signed)
CALLED PATIENT & L/M FOR HER TO CALL & SCHEDULE AN APPOINTMENT WITH DR BYRNETT FOR A COLONOSCOPY. REF'D BY DR Derrel Nip POSITIVE COLOGAURD TEST,LAST COLON OVER 51YRS AGO.

## 2016-04-30 NOTE — Telephone Encounter (Signed)
Spoke with pt and informed her that Dr. Derrel Nip has referred her Dr. Job Founds and that Lenna Sciara would be giving her a call when an appt was scheduled.

## 2016-05-07 ENCOUNTER — Encounter: Payer: Self-pay | Admitting: *Deleted

## 2016-05-08 ENCOUNTER — Encounter: Payer: Self-pay | Admitting: General Surgery

## 2016-05-20 ENCOUNTER — Encounter: Payer: Self-pay | Admitting: *Deleted

## 2016-05-22 ENCOUNTER — Encounter: Payer: Self-pay | Admitting: General Surgery

## 2016-05-22 ENCOUNTER — Ambulatory Visit (INDEPENDENT_AMBULATORY_CARE_PROVIDER_SITE_OTHER): Payer: BLUE CROSS/BLUE SHIELD | Admitting: General Surgery

## 2016-05-22 VITALS — BP 112/72 | HR 78 | Resp 12 | Ht 64.0 in | Wt 148.0 lb

## 2016-05-22 DIAGNOSIS — R195 Other fecal abnormalities: Secondary | ICD-10-CM

## 2016-05-22 MED ORDER — POLYETHYLENE GLYCOL 3350 17 GM/SCOOP PO POWD: ORAL | 0 refills | Status: DC

## 2016-05-22 NOTE — Patient Instructions (Signed)
The patient is aware to call back for any questions or concerns.  Colonoscopy, Adult A colonoscopy is an exam to look at the entire large intestine. During the exam, a lubricated, bendable tube is inserted into the anus and then passed into the rectum, colon, and other parts of the large intestine. A colonoscopy is often done as a part of normal colorectal screening or in response to certain symptoms, such as anemia, persistent diarrhea, abdominal pain, and blood in the stool. The exam can help screen for and diagnose medical problems, including:  Tumors.  Polyps.  Inflammation.  Areas of bleeding. Tell a health care provider about:  Any allergies you have.  All medicines you are taking, including vitamins, herbs, eye drops, creams, and over-the-counter medicines.  Any problems you or family members have had with anesthetic medicines.  Any blood disorders you have.  Any surgeries you have had.  Any medical conditions you have.  Any problems you have had passing stool. What are the risks? Generally, this is a safe procedure. However, problems may occur, including:  Bleeding.  A tear in the intestine.  A reaction to medicines given during the exam.  Infection (rare). What happens before the procedure? Eating and drinking restrictions  Follow instructions from your health care provider about eating and drinking, which may include:  A few days before the procedure - follow a low-fiber diet. Avoid nuts, seeds, dried fruit, raw fruits, and vegetables.  1-3 days before the procedure - follow a clear liquid diet. Drink only clear liquids, such as clear broth or bouillon, black coffee or tea, clear juice, clear soft drinks or sports drinks, gelatin dessert, and popsicles. Avoid any liquids that contain red or purple dye.  On the day of the procedure - do not eat or drink anything during the 2 hours before the procedure, or within the time period that your health care provider  recommends. Bowel prep  If you were prescribed an oral bowel prep to clean out your colon:  Take it as told by your health care provider. Starting the day before your procedure, you will need to drink a large amount of medicated liquid. The liquid will cause you to have multiple loose stools until your stool is almost clear or light green.  If your skin or anus gets irritated from diarrhea, you may use these to relieve the irritation:  Medicated wipes, such as adult wet wipes with aloe and vitamin E.  A skin soothing-product like petroleum jelly.  If you vomit while drinking the bowel prep, take a break for up to 60 minutes and then begin the bowel prep again. If vomiting continues and you cannot take the bowel prep without vomiting, call your health care provider. General instructions   Ask your health care provider about changing or stopping your regular medicines. This is especially important if you are taking diabetes medicines or blood thinners.  Plan to have someone take you home from the hospital or clinic. What happens during the procedure?  An IV tube may be inserted into one of your veins.  You will be given medicine to help you relax (sedative).  To reduce your risk of infection:  Your health care team will wash or sanitize their hands.  Your anal area will be washed with soap.  You will be asked to lie on your side with your knees bent.  Your health care provider will lubricate a long, thin, flexible tube. The tube will have a camera and a   light on the end.  The tube will be inserted into your anus.  The tube will be gently eased through your rectum and colon.  Air will be delivered into your colon to keep it open. You may feel some pressure or cramping.  The camera will be used to take images during the procedure.  A small tissue sample may be removed from your body to be examined under a microscope (biopsy). If any potential problems are found, the tissue will  be sent to a lab for testing.  If small polyps are found, your health care provider may remove them and have them checked for cancer cells.  The tube that was inserted into your anus will be slowly removed. The procedure may vary among health care providers and hospitals. What happens after the procedure?  Your blood pressure, heart rate, breathing rate, and blood oxygen level will be monitored until the medicines you were given have worn off.  Do not drive for 24 hours after the exam.  You may have a small amount of blood in your stool.  You may pass gas and have mild abdominal cramping or bloating due to the air that was used to inflate your colon during the exam.  It is up to you to get the results of your procedure. Ask your health care provider, or the department performing the procedure, when your results will be ready. This information is not intended to replace advice given to you by your health care provider. Make sure you discuss any questions you have with your health care provider. Document Released: 01/04/2000 Document Revised: 11/07/2015 Document Reviewed: 03/20/2015 Elsevier Interactive Patient Education  2017 Elsevier Inc.  

## 2016-05-22 NOTE — Progress Notes (Signed)
Patient ID: Amber Sullivan, female   DOB: [birth year], 61 y.o.   MRN: [id number]  Chief Complaint  Patient presents with  . Other    positive cologuard    HPI Amber Sullivan is a 61 y.o. female.  Here for colonoscopy discussion due to a positive cologuard 04-20-16. Denies any gastrointestinal  issues. Bowels move daily, no bleeding. No family history of colon cancer. No prior colonoscopy. 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  HPI  Past Medical History:  Diagnosis Date  . Hyperlipidemia   . Hypertension 2003  . Positive colorectal cancer screening using Cologuard test 04/20/2016    Past Surgical History:  Procedure Laterality Date  . CHOLECYSTECTOMY  2005  . MOHS SURGERY  2007   squamou cell carcinoma rt nasolabial fold  . SQUAMOUS CELL CARCINOMA EXCISION  2007   mole on face    Family History  Problem Relation Age of Onset  . Hypertension Mother   . Diabetes Mother     type 2  . Stroke Mother 38    massive, with aphasia and paraplegia  . Hypertension Father   . Hyperlipidemia Father   . Heart attack Father     vs PE during hospitalization for chest pain   . Heart disease Father   . Heart attack Maternal Grandfather   . Heart disease Paternal Grandmother     CHF  . Heart disease Paternal Grandfather   . Heart attack Paternal Grandfather     Social History Social History  Substance Use Topics  . Smoking status: Never Smoker  . Smokeless tobacco: Never Used  . Alcohol use Yes     Comment: not much    Allergies  Allergen Reactions  . Ace Inhibitors Cough    Current Outpatient Prescriptions  Medication Sig Dispense Refill  . Calcium Carbonate-Vit D-Min (CALTRATE 600+D PLUS MINERALS) 600-800 MG-UNIT TABS Take 1 tablet by mouth daily. Taking 1 tablet every other day    . Diclofenac Sodium (PENNSAID) 2 % SOLN Place 2 application onto the skin 2 (two) times daily. (Patient taking differently: Place 2 application onto the  skin 2 (two) times daily as needed. ) 112 g 3  . diphenhydrAMINE (BENADRYL) 25 MG tablet Take 25 mg by mouth daily.    . fish oil-omega-3 fatty acids 1000 MG capsule Take 2 g by mouth daily.    Marland Kitchen LORazepam (ATIVAN) 1 MG tablet Take 1 tablet (1 mg total) by mouth every 8 (eight) hours as needed for anxiety. 20 tablet 0  . losartan (COZAAR) 100 MG tablet TAKE 1 TABLET DAILY 90 tablet 1  . simvastatin (ZOCOR) 20 MG tablet TAKE 1 TABLET EVERY OTHER DAY 90 tablet 1  . zolpidem (AMBIEN) 10 MG tablet Take 0.5 tablets (5 mg total) by mouth at bedtime as needed. 45 tablet 0  . polyethylene glycol powder (GLYCOLAX/MIRALAX) powder 255 grams one bottle for colonoscopy prep 255 g 0   No current facility-administered medications for this visit.     Review of Systems Review of Systems  Constitutional: Negative.   Respiratory: Negative.   Cardiovascular: Negative.   Gastrointestinal: Negative.     Blood pressure 112/72, pulse 78, resp. rate 12, height 5\' 4"  (1.626 m), weight 148 lb (67.1 kg).  Physical Exam Physical Exam  Constitutional: She is oriented to person, place, and time. She appears well-developed and well-nourished.  HENT:  Mouth/Throat: Oropharynx is clear and moist.  Eyes: Conjunctivae are normal. No scleral  icterus.  Neck: Neck supple.  Cardiovascular: Normal rate, regular rhythm and normal heart sounds.   Pulmonary/Chest: Effort normal and breath sounds normal.  Abdominal: Soft.  Lymphadenopathy:    She has no cervical adenopathy.  Neurological: She is alert and oriented to person, place, and time.  Skin: Skin is warm and dry.  Psychiatric: Her behavior is normal.    Data Reviewed 04/20/2016 Cologuard: Positive. 04/21/2016 comprehensive metabolic panel: Normal, creatinine 0.8, estimated GFR 76.  CBC dated 11/19/2015 normal: Hemoglobin 13.6 with an MCV of 92.  Assessment    Candidate for diagnostic colonoscopy.    Plan    04/20/2016 Cologuard: Positive.     Colonoscopy with possible biopsy/polypectomy prn: Information regarding the procedure, including its potential risks and complications (including but not limited to perforation of the bowel, which may require emergency surgery to repair, and bleeding) was verbally given to the patient. Educational information regarding lower intestinal endoscopy was given to the patient. Written instructions for how to complete the bowel prep using Miralax were provided. The importance of drinking ample fluids to avoid dehydration as a result of the prep emphasized.  HPI, Physical Exam, Assessment and Plan have been scribed under the direction and in the presence of Robert Bellow, MD.  Karie Fetch, RN  I have completed the exam and reviewed the above documentation for accuracy and completeness.  I agree with the above.  Haematologist has been used and any errors in dictation or transcription are unintentional.  Hervey Ard, M.D., F.A.C.S.   Robert Bellow 05/23/2016, 4:58 AM  Patient has been scheduled for a colonoscopy on 07-16-16 at [facility]. This patient has been asked to discontinue fish oil one week prior to procedure. Miralax prescription has been sent in to the patient's pharmacy today. Colonoscopy instructions have been reviewed with the patient. This patient is aware to call the office if they have further questions.   Dominga Ferry, CMA

## 2016-05-27 ENCOUNTER — Telehealth: Payer: Self-pay

## 2016-05-27 NOTE — Telephone Encounter (Signed)
Patient called to reschedule her colonoscopy scheduled for 07/16/16. The patient is now scheduled for a Colonoscopy at [facility] on 08/13/16. They are aware to call the day before to get their arrival time. She will hold her fish oil for one week prior. Miralax prescription has already been sent into the patient's pharmacy. The patient is aware of date and instructions. Endo notified of change.

## 2016-05-28 ENCOUNTER — Other Ambulatory Visit: Payer: Self-pay | Admitting: Internal Medicine

## 2016-06-27 ENCOUNTER — Ambulatory Visit (INDEPENDENT_AMBULATORY_CARE_PROVIDER_SITE_OTHER): Payer: BLUE CROSS/BLUE SHIELD | Admitting: Internal Medicine

## 2016-06-27 ENCOUNTER — Encounter: Payer: Self-pay | Admitting: Internal Medicine

## 2016-06-27 VITALS — BP 120/70 | HR 98 | Temp 97.7°F | Resp 17 | Ht 64.0 in | Wt 146.6 lb

## 2016-06-27 DIAGNOSIS — J206 Acute bronchitis due to rhinovirus: Secondary | ICD-10-CM | POA: Diagnosis not present

## 2016-06-27 DIAGNOSIS — Z79899 Other long term (current) drug therapy: Secondary | ICD-10-CM | POA: Diagnosis not present

## 2016-06-27 DIAGNOSIS — I10 Essential (primary) hypertension: Secondary | ICD-10-CM

## 2016-06-27 DIAGNOSIS — F5104 Psychophysiologic insomnia: Secondary | ICD-10-CM | POA: Diagnosis not present

## 2016-06-27 DIAGNOSIS — E782 Mixed hyperlipidemia: Secondary | ICD-10-CM

## 2016-06-27 MED ORDER — ZOLPIDEM TARTRATE 10 MG PO TABS: 10.0000 mg | ORAL_TABLET | Freq: Every evening | ORAL | 1 refills | Status: DC | PRN

## 2016-06-27 MED ORDER — PREDNISONE 10 MG PO TABS: ORAL_TABLET | ORAL | 0 refills | Status: DC

## 2016-06-27 MED ORDER — ZOLPIDEM TARTRATE ER 6.25 MG PO TBCR: 6.2500 mg | EXTENDED_RELEASE_TABLET | Freq: Every evening | ORAL | 0 refills | Status: DC | PRN

## 2016-06-27 MED ORDER — BENZONATATE 200 MG PO CAPS: 200.0000 mg | ORAL_CAPSULE | Freq: Three times a day (TID) | ORAL | 1 refills | Status: DC | PRN

## 2016-06-27 NOTE — Progress Notes (Signed)
Subjective:  Patient ID: Amber Sullivan, female    DOB: 19-Aug-1955  Age: 61 y.o. MRN: [id number]  CC: Diagnoses of Chronic insomnia, Essential hypertension, Long-term use of high-risk medication, and Acute bronchitis due to Rhinovirus were pertinent to this visit.  HPI Amber Sullivan presents for follow up on hypertension, hyperlipidemia .    Has had a dry cough since Friday.  Flew to [place] on Friday.  Denies fevers, body aches,.   No sneezing   Positive cologuard.  Discussed the significance and potential implications.   Referred to GI July 24  Has a gi appt   Still waking up at 2 am despite [place].   Uses 1/2 to 1/4 tablet at 2 am if unable to fall asleep after 30 minutes.  k .  followingn all the rules about sleep,  less than one glass of wine per night   Had mammogram at [facility]  on [facility]  one month ago , normal per patient   Last PAP 2016      Outpatient Medications Prior to Visit  Medication Sig Dispense Refill  . Calcium Carbonate-Vit D-Min (CALTRATE 600+D PLUS MINERALS) 600-800 MG-UNIT TABS Take 1 tablet by mouth daily. Taking 1 tablet every other day    . Diclofenac Sodium (PENNSAID) 2 % SOLN Place 2 application onto the skin 2 (two) times daily. (Patient taking differently: Place 2 application onto the skin 2 (two) times daily as needed. ) 112 g 3  . diphenhydrAMINE (BENADRYL) 25 MG tablet Take 25 mg by mouth daily.    . fish oil-omega-3 fatty acids 1000 MG capsule Take 2 g by mouth daily.    Marland Kitchen losartan (COZAAR) 100 MG tablet TAKE 1 TABLET DAILY 90 tablet 1  . simvastatin (ZOCOR) 20 MG tablet TAKE 1 TABLET EVERY OTHER DAY 90 tablet 1  . zolpidem (AMBIEN) 10 MG tablet Take 0.5 tablets (5 mg total) by mouth at bedtime as needed. 45 tablet 0  . LORazepam (ATIVAN) 1 MG tablet Take 1 tablet (1 mg total) by mouth every 8 (eight) hours as needed for anxiety. (Patient not taking: Reported on 06/27/2016) 20 tablet 0  . polyethylene glycol powder  (GLYCOLAX/MIRALAX) powder 255 grams one bottle for colonoscopy prep (Patient not taking: Reported on 06/27/2016) 255 g 0   No facility-administered medications prior to visit.     Review of Systems;  Patient denies headache, fevers, malaise, unintentional weight loss, skin rash, eye pain, sinus congestion and sinus pain, sore throat, dysphagia,  hemoptysis , cough, dyspnea, wheezing, chest pain, palpitations, orthopnea, edema, abdominal pain, nausea, melena, diarrhea, constipation, flank pain, dysuria, hematuria, urinary  Frequency, nocturia, numbness, tingling, seizures,  Focal weakness, Loss of consciousness,  Tremor, insomnia, depression, anxiety, and suicidal ideation.      Objective:  BP 120/70 (BP Location: Left Arm, Patient Position: Sitting, Cuff Size: Normal)   Pulse 98   Temp 97.7 F (36.5 C) (Oral)   Resp 17   Ht 5\' 4"  (1.626 m)   Wt 146 lb 9.6 oz (66.5 kg)   SpO2 98%   BMI 25.16 kg/m   BP Readings from Last 3 Encounters:  06/27/16 120/70  05/22/16 112/72  11/19/15 (!) 144/80    Wt Readings from Last 3 Encounters:  06/27/16 146 lb 9.6 oz (66.5 kg)  05/22/16 148 lb (67.1 kg)  11/19/15 146 lb (66.2 kg)    General appearance: alert, cooperative and appears stated age Ears: normal TM's and external ear canals both ears Throat:  lips, mucosa, and tongue normal; teeth and gums normal Neck: no adenopathy, no carotid bruit, supple, symmetrical, trachea midline and thyroid not enlarged, symmetric, no tenderness/mass/nodules Back: symmetric, no curvature. ROM normal. No CVA tenderness. Lungs: clear to auscultation bilaterally Heart: regular rate and rhythm, S1, S2 normal, no murmur, click, rub or gallop Abdomen: soft, non-tender; bowel sounds normal; no masses,  no organomegaly Pulses: 2+ and symmetric Skin: Skin color, texture, turgor normal. No rashes or lesions Lymph nodes: Cervical, supraclavicular, and axillary nodes normal.  No results found for: HGBA1C  Lab  Results  Component Value Date   CREATININE 0.81 04/21/2016   CREATININE 0.84 07/25/2015   CREATININE 0.77 10/25/2014    Lab Results  Component Value Date   WBC 6.3 11/19/2015   HGB 13.6 11/19/2015   HCT 40.7 11/19/2015   PLT 208.0 11/19/2015   GLUCOSE 89 04/21/2016   CHOL 189 11/19/2015   TRIG 89.0 11/19/2015   HDL 61.50 11/19/2015   LDLDIRECT 109.0 10/25/2014   LDLCALC 109 (H) 11/19/2015   ALT 13 04/21/2016   AST 17 04/21/2016   NA 140 04/21/2016   K 4.6 04/21/2016   CL 107 04/21/2016   CREATININE 0.81 04/21/2016   BUN 13 04/21/2016   CO2 22 04/21/2016   TSH 2.07 11/19/2015   MICROALBUR 0.5 08/30/2012    No results found.  Assessment & Plan:   Problem List Items Addressed This Visit    Long-term use of high-risk medication (Chronic)    CMET was done recently to monitor electrolytes, renal function and liver enzymes given chronic use of ARB, statin and NSAIDs,  All were normal.  Lab Results  Component Value Date   NA 140 04/21/2016   K 4.6 04/21/2016   CL 107 04/21/2016   CO2 22 04/21/2016   Lab Results  Component Value Date   CREATININE 0.81 04/21/2016   Lab Results  Component Value Date   ALT 13 04/21/2016   AST 17 04/21/2016   ALKPHOS 57 04/21/2016   BILITOT 0.5 04/21/2016         Hypertension    Well controlled on current regimen. Renal function stable, no changes today.  Lab Results  Component Value Date   CREATININE 0.81 04/21/2016   Lab Results  Component Value Date   NA 140 04/21/2016   K 4.6 04/21/2016   CL 107 04/21/2016   CO2 22 04/21/2016         Chronic insomnia    Early waking on 5 mg ambien .  Trial of ambien cr      Acute bronchitis    Symptoms suggestive of viral syndrome.  prednisoe taper and cough suppressant given.          I have discontinued Ms. Awwad's LORazepam, zolpidem, polyethylene glycol powder, and zolpidem. I am also having her start on benzonatate, predniSONE, and zolpidem. Additionally, I am  having her maintain her fish oil-omega-3 fatty acids, Diclofenac Sodium, CALTRATE 600+D PLUS MINERALS, simvastatin, diphenhydrAMINE, and losartan.  Meds ordered this encounter  Medications  . benzonatate (TESSALON) 200 MG capsule    Sig: Take 1 capsule (200 mg total) by mouth 3 (three) times daily as needed for cough.    Dispense:  60 capsule    Refill:  1  . predniSONE (DELTASONE) 10 MG tablet    Sig: 6 tablets on Day 1 , then reduce by 1 tablet daily until gone    Dispense:  21 tablet    Refill:  0  . DISCONTD:  zolpidem (AMBIEN) 10 MG tablet    Sig: Take 1 tablet (10 mg total) by mouth at bedtime as needed.    Dispense:  90 tablet    Refill:  1  . zolpidem (AMBIEN CR) 6.25 MG CR tablet    Sig: Take 1 tablet (6.25 mg total) by mouth at bedtime as needed for sleep.    Dispense:  30 tablet    Refill:  0    Medications Discontinued During This Encounter  Medication Reason  . polyethylene glycol powder (GLYCOLAX/MIRALAX) powder Patient has not taken in last 30 days  . LORazepam (ATIVAN) 1 MG tablet   . zolpidem (AMBIEN) 10 MG tablet Reorder  . zolpidem (AMBIEN) 10 MG tablet     Follow-up: Return in about 6 months (around 12/27/2016) for CPE with PAP,  fasting labs prior to appt.   Crecencio Mc, MD

## 2016-06-27 NOTE — Patient Instructions (Signed)
Start the prednisone taper in the morning for your bronchitis.  The cough tablets are NOT CONTROLLED SUBSTANCE so you can use them during the day if needed   Trial of ambien CR (generic version) to help you sleep through  The night   See you in 6 months..  Fsating labs prior to visit would be a good idea

## 2016-06-29 DIAGNOSIS — J209 Acute bronchitis, unspecified: Secondary | ICD-10-CM | POA: Insufficient documentation

## 2016-06-29 NOTE — Assessment & Plan Note (Signed)
CMET was done recently to monitor electrolytes, renal function and liver enzymes given chronic use of ARB, statin and NSAIDs,  All were normal.  Lab Results  Component Value Date   NA 140 04/21/2016   K 4.6 04/21/2016   CL 107 04/21/2016   CO2 22 04/21/2016   Lab Results  Component Value Date   CREATININE 0.81 04/21/2016   Lab Results  Component Value Date   ALT 13 04/21/2016   AST 17 04/21/2016   ALKPHOS 57 04/21/2016   BILITOT 0.5 04/21/2016

## 2016-06-29 NOTE — Assessment & Plan Note (Signed)
Symptoms suggestive of viral syndrome.  prednisoe taper and cough suppressant given.

## 2016-06-29 NOTE — Assessment & Plan Note (Signed)
Early waking on 5 mg ambien .  Trial of ambien cr

## 2016-06-29 NOTE — Assessment & Plan Note (Signed)
Well controlled on current regimen. Renal function stable, no changes today.  Lab Results  Component Value Date   CREATININE 0.81 04/21/2016   Lab Results  Component Value Date   NA 140 04/21/2016   K 4.6 04/21/2016   CL 107 04/21/2016   CO2 22 04/21/2016

## 2016-07-11 DIAGNOSIS — Z85828 Personal history of other malignant neoplasm of skin: Secondary | ICD-10-CM | POA: Diagnosis not present

## 2016-08-12 ENCOUNTER — Encounter: Payer: Self-pay | Admitting: *Deleted

## 2016-08-13 ENCOUNTER — Encounter
Admission: RE | Disposition: A | Discharge status: Home / Self Care | Payer: Self-pay | Source: Ambulatory Visit | Attending: General Surgery

## 2016-08-13 ENCOUNTER — Ambulatory Visit: Payer: BLUE CROSS/BLUE SHIELD | Admitting: Anesthesiology

## 2016-08-13 ENCOUNTER — Ambulatory Visit
Admission: RE | Admit: 2016-08-13 | Discharge: 2016-08-13 | Disposition: A | Discharge status: Home / Self Care | Payer: BLUE CROSS/BLUE SHIELD | Source: Ambulatory Visit | Attending: General Surgery | Admitting: General Surgery

## 2016-08-13 ENCOUNTER — Encounter: Payer: Self-pay | Admitting: Anesthesiology

## 2016-08-13 DIAGNOSIS — E785 Hyperlipidemia, unspecified: Secondary | ICD-10-CM | POA: Diagnosis not present

## 2016-08-13 DIAGNOSIS — D125 Benign neoplasm of sigmoid colon: Secondary | ICD-10-CM | POA: Diagnosis not present

## 2016-08-13 DIAGNOSIS — Z79899 Other long term (current) drug therapy: Secondary | ICD-10-CM | POA: Insufficient documentation

## 2016-08-13 DIAGNOSIS — K921 Melena: Secondary | ICD-10-CM | POA: Insufficient documentation

## 2016-08-13 DIAGNOSIS — Z888 Allergy status to other drugs, medicaments and biological substances status: Secondary | ICD-10-CM | POA: Diagnosis not present

## 2016-08-13 DIAGNOSIS — R195 Other fecal abnormalities: Secondary | ICD-10-CM

## 2016-08-13 DIAGNOSIS — Z85828 Personal history of other malignant neoplasm of skin: Secondary | ICD-10-CM | POA: Diagnosis not present

## 2016-08-13 DIAGNOSIS — I10 Essential (primary) hypertension: Secondary | ICD-10-CM | POA: Diagnosis not present

## 2016-08-13 DIAGNOSIS — K621 Rectal polyp: Secondary | ICD-10-CM | POA: Insufficient documentation

## 2016-08-13 DIAGNOSIS — K5730 Diverticulosis of large intestine without perforation or abscess without bleeding: Secondary | ICD-10-CM | POA: Diagnosis not present

## 2016-08-13 DIAGNOSIS — Z9049 Acquired absence of other specified parts of digestive tract: Secondary | ICD-10-CM | POA: Diagnosis not present

## 2016-08-13 HISTORY — PX: COLONOSCOPY WITH PROPOFOL: SHX5780

## 2016-08-13 SURGERY — COLONOSCOPY WITH PROPOFOL
Anesthesia: General

## 2016-08-13 MED ORDER — EPHEDRINE SULFATE 50 MG/ML IJ SOLN
INTRAMUSCULAR | Status: DC | PRN
  Administered 2016-08-13: 5 mg via INTRAVENOUS

## 2016-08-13 MED ORDER — SODIUM CHLORIDE 0.9 % IV SOLN
INTRAVENOUS | Status: DC
  Administered 2016-08-13: 1000 mL via INTRAVENOUS

## 2016-08-13 MED ORDER — LIDOCAINE 2% (20 MG/ML) 5 ML SYRINGE
INTRAMUSCULAR | Status: DC | PRN
  Administered 2016-08-13: 50 mg via INTRAVENOUS

## 2016-08-13 MED ORDER — PROPOFOL 10 MG/ML IV BOLUS
INTRAVENOUS | Status: DC | PRN
  Administered 2016-08-13: 60 mg via INTRAVENOUS

## 2016-08-13 MED ORDER — PROPOFOL 500 MG/50ML IV EMUL
INTRAVENOUS | Status: AC
  Filled 2016-08-13: qty 50

## 2016-08-13 MED ORDER — PROPOFOL 500 MG/50ML IV EMUL
INTRAVENOUS | Status: DC | PRN
  Administered 2016-08-13: 200 ug/kg/min via INTRAVENOUS

## 2016-08-13 NOTE — Anesthesia Postprocedure Evaluation (Signed)
Anesthesia Post Note  Patient: Amber Sullivan  Procedure(s) Performed: Procedure(s) (LRB): COLONOSCOPY WITH PROPOFOL (N/A)  Patient location during evaluation: Endoscopy Anesthesia Type: General Level of consciousness: awake and alert Pain management: pain level controlled Vital Signs Assessment: post-procedure vital signs reviewed and stable Respiratory status: spontaneous breathing, nonlabored ventilation, respiratory function stable and patient connected to nasal cannula oxygen Cardiovascular status: blood pressure returned to baseline and stable Postop Assessment: no signs of nausea or vomiting Anesthetic complications: no     Last Vitals:  Vitals:   08/13/16 0830 08/13/16 0840  BP: (!) 112/54 118/61  Pulse: 72 72  Resp: 16 13  Temp:      Last Pain:  Vitals:   08/13/16 0811  TempSrc: Tympanic                 Martha Clan

## 2016-08-13 NOTE — H&P (Signed)
Amber Sullivan [id number] 07-12-1955     HPI: 61 y/o woman with a positive cologuard test. For colonoscopy.  Tolerated prep well.   Prescriptions Prior to Admission  Medication Sig Dispense Refill Last Dose  . benzonatate (TESSALON) 200 MG capsule Take 1 capsule (200 mg total) by mouth 3 (three) times daily as needed for cough. 60 capsule 1 Past Week at Unknown time  . Calcium Carbonate-Vit D-Min (CALTRATE 600+D PLUS MINERALS) 600-800 MG-UNIT TABS Take 1 tablet by mouth daily. Taking 1 tablet every other day   Past Week at Unknown time  . Diclofenac Sodium (PENNSAID) 2 % SOLN Place 2 application onto the skin 2 (two) times daily. (Patient taking differently: Place 2 application onto the skin 2 (two) times daily as needed. ) 112 g 3 Past Week at Unknown time  . diphenhydrAMINE (BENADRYL) 25 MG tablet Take 25 mg by mouth daily.   Past Week at Unknown time  . fish oil-omega-3 fatty acids 1000 MG capsule Take 2 g by mouth daily.   Past Week at Unknown time  . losartan (COZAAR) 100 MG tablet TAKE 1 TABLET DAILY 90 tablet 1 08/12/2016 at Unknown time  . predniSONE (DELTASONE) 10 MG tablet 6 tablets on Day 1 , then reduce by 1 tablet daily until gone 21 tablet 0 Past Week at Unknown time  . simvastatin (ZOCOR) 20 MG tablet TAKE 1 TABLET EVERY OTHER DAY 90 tablet 1 08/12/2016 at Unknown time  . zolpidem (AMBIEN CR) 6.25 MG CR tablet Take 1 tablet (6.25 mg total) by mouth at bedtime as needed for sleep. 30 tablet 0 Past Week at Unknown time   Allergies  Allergen Reactions  . Ace Inhibitors Cough   Past Medical History:  Diagnosis Date  . Hyperlipidemia   . Hypertension 2003  . Positive colorectal cancer screening using Cologuard test 04/20/2016   Past Surgical History:  Procedure Laterality Date  . CHOLECYSTECTOMY  2005  . MOHS SURGERY  2007   squamou cell carcinoma rt nasolabial fold  . SQUAMOUS CELL CARCINOMA EXCISION  2007   mole on face   Social History   Social History  .  Marital status: Married    Spouse name: N/A  . Number of children: N/A  . Years of education: N/A   Occupational History  . Not on file.   Social History Main Topics  . Smoking status: Never Smoker  . Smokeless tobacco: Never Used  . Alcohol use Yes     Comment: not much  . Drug use: No  . Sexual activity: Not on file   Other Topics Concern  . Not on file   Social History Narrative  . No narrative on file   Social History   Social History Narrative  . No narrative on file     ROS: Negative.     PE: HEENT: Negative. Lungs: Clear. Cardio: RR.  Assessment/Plan:  Proceed with planned endoscopy.   Robert Bellow 08/13/2016

## 2016-08-13 NOTE — Anesthesia Post-op Follow-up Note (Cosign Needed)
Anesthesia QCDR form completed.        

## 2016-08-13 NOTE — Anesthesia Preprocedure Evaluation (Signed)
Anesthesia Evaluation  Patient identified by MRN, date of birth, ID band Patient awake    Reviewed: Allergy & Precautions, H&P , NPO status , Patient's Chart, lab work & pertinent test results, reviewed documented beta blocker date and time   History of Anesthesia Complications (+) PONV and history of anesthetic complications  Airway Mallampati: II  TM Distance: <3 FB Neck ROM: full    Dental  (+) Caps, Dental Advidsory Given Permanent bridge on the top left:   Pulmonary neg pulmonary ROS,           Cardiovascular Exercise Tolerance: Good hypertension, (-) angina(-) CAD, (-) Past MI, (-) Cardiac Stents and (-) CABG (-) dysrhythmias (-) Valvular Problems/Murmurs     Neuro/Psych negative neurological ROS  negative psych ROS   GI/Hepatic negative GI ROS, Neg liver ROS,   Endo/Other  negative endocrine ROS  Renal/GU negative Renal ROS  negative genitourinary   Musculoskeletal   Abdominal   Peds  Hematology negative hematology ROS (+)   Anesthesia Other Findings Past Medical History: No date: Hyperlipidemia 2003: Hypertension 04/20/2016: Positive colorectal cancer screening using Cologuard test   Reproductive/Obstetrics negative OB ROS                             Anesthesia Physical Anesthesia Plan  ASA: II  Anesthesia Plan: General   Post-op Pain Management:    Induction: Intravenous  PONV Risk Score and Plan: 4 or greater and Propofol  Airway Management Planned: Natural Airway and Nasal Cannula  Additional Equipment:   Intra-op Plan:   Post-operative Plan: Extubation in OR  Informed Consent: I have reviewed the patients History and Physical, chart, labs and discussed the procedure including the risks, benefits and alternatives for the proposed anesthesia with the patient or authorized representative who has indicated his/her understanding and acceptance.   Dental Advisory  Given  Plan Discussed with: Anesthesiologist, CRNA and Surgeon  Anesthesia Plan Comments:         Anesthesia Quick Evaluation

## 2016-08-13 NOTE — Transfer of Care (Signed)
Immediate Anesthesia Transfer of Care Note  Patient: Amber Sullivan  Procedure(s) Performed: Procedure(s): COLONOSCOPY WITH PROPOFOL (N/A)  Patient Location: Endoscopy Unit  Anesthesia Type:General  Level of Consciousness: sedated  Airway & Oxygen Therapy: Patient connected to nasal cannula oxygen  Post-op Assessment: Post -op Vital signs reviewed and stable  Post vital signs: stable  Last Vitals:  Vitals:   08/13/16 0810 08/13/16 0811  BP: (!) 94/47 (!) 94/47  Pulse: 88 87  Resp: 16 14  Temp: (!) 35.6 C (!) 35.6 C    Last Pain:  Vitals:   08/13/16 0811  TempSrc: Tympanic         Complications: No apparent anesthesia complications

## 2016-08-13 NOTE — Op Note (Signed)
[facility] Patient Name: Amber Sullivan Procedure Date: 08/13/2016 7:21 AM MRN: [id number] Account #: [id number] Date of Birth: [birth year] Admit Type: Outpatient Age: 61 Room: [facility] ENDO ROOM 1 Gender: Female Note Status: Finalized Procedure:            Colonoscopy Indications:          Positive Cologuard test Providers:            Robert Bellow, MD Referring MD:         Deborra Medina, MD (Referring MD) Medicines:            Monitored Anesthesia Care Complications:        No immediate complications. Procedure:            Pre-Anesthesia Assessment:                       - Prior to the procedure, a History and Physical was                        performed, and patient medications, allergies and                        sensitivities were reviewed. The patient's tolerance of                        previous anesthesia was reviewed.                       - The risks and benefits of the procedure and the                        sedation options and risks were discussed with the                        patient. All questions were answered and informed                        consent was obtained.                       After obtaining informed consent, the colonoscope was                        passed under direct vision. Throughout the procedure,                        the patient's blood pressure, pulse, and oxygen                        saturations were monitored continuously. The                        Colonoscope was introduced through the anus and                        advanced to the the cecum, identified by appendiceal                        orifice and ileocecal valve. The colonoscopy was  performed without difficulty. The patient tolerated the                        procedure well. The quality of the bowel preparation                        was excellent. Findings:      Two sessile polyps were found in the sigmoid colon  and mid sigmoid       colon. The polyps were 5 mm in size. These were biopsied with a cold       forceps for histology.      A 8 mm polyp was found in the rectum. The polyp was sessile. This was       biopsied with a cold forceps for histology.      The retroflexed view of the distal rectum and anal verge was normal and       showed no anal or rectal abnormalities. Impression:           - Two 5 mm polyps in the sigmoid colon and in the mid                        sigmoid colon. Biopsied.                       - One 8 mm polyp in the rectum. Biopsied.                       - The distal rectum and anal verge are normal on                        retroflexion view. Recommendation:       - Use fiber, for example Citrucel, Fibercon, Konsyl or                        Metamucil.                       - Telephone endoscopist for pathology results in 1 week. Procedure Code(s):    --- Professional ---                       [phone], Colonoscopy, flexible; with biopsy, single or                        multiple Diagnosis Code(s):    --- Professional ---                       R19.5, Other fecal abnormalities                       K62.1, Rectal polyp                       D12.5, Benign neoplasm of sigmoid colon CPT copyright 2016 American Medical Association. All rights reserved. The codes documented in this report are preliminary and upon coder review may  be revised to meet current compliance requirements. Robert Bellow, MD 08/13/2016 8:08:40 AM This report has been signed electronically. Number of Addenda: 0 Note Initiated On: 08/13/2016 7:21 AM Scope Withdrawal Time: 0 hours 17 minutes 24 seconds  Total Procedure Duration: 0 hours 23  minutes 28 seconds       [facility]

## 2016-08-14 ENCOUNTER — Encounter: Payer: Self-pay | Admitting: General Surgery

## 2016-08-14 ENCOUNTER — Telehealth: Payer: Self-pay

## 2016-08-14 LAB — SURGICAL PATHOLOGY

## 2016-08-14 NOTE — Telephone Encounter (Signed)
-----   Message from Robert Bellow, MD sent at 08/14/2016  3:01 PM EDT ----- Please notify the patient that all biopsies OK.  Would recommend a f/u colonoscopy in 10 years. ----- Message ----- From: Interface, Lab In Three Zero One Sent: 08/14/2016  11:16 AM To: Robert Bellow, MD

## 2016-08-15 NOTE — Telephone Encounter (Signed)
Notified patient as instructed, patient pleased. Discussed follow-up appointments, patient agrees. Patient placed in recalls.   

## 2016-09-05 ENCOUNTER — Encounter: Payer: Self-pay | Admitting: *Deleted

## 2016-09-17 ENCOUNTER — Telehealth: Payer: Self-pay | Admitting: Internal Medicine

## 2016-09-17 MED ORDER — ZOLPIDEM TARTRATE 5 MG PO TABS: 5.0000 mg | ORAL_TABLET | Freq: Every evening | ORAL | 0 refills | Status: DC | PRN

## 2016-09-17 NOTE — Telephone Encounter (Signed)
Please advise 

## 2016-09-17 NOTE — Telephone Encounter (Signed)
90 DAY REFILL on ambein 5 mg printed for fax to [facility]

## 2016-09-17 NOTE — Telephone Encounter (Signed)
Pt called and stated that she would like to go back to the regular ambien 10 mg, she stated that the extended release does not work for her. Please advise, thank you!  Call pt @ [phone]  Pharmacy - [facility] for [place], [place]

## 2016-09-17 NOTE — Telephone Encounter (Signed)
LMTCB. Need to let pt know that Dr. Derrel Nip switched her back to the previous [place] she was taking and that we have faxed it to the [place] pharmacy.

## 2016-09-19 NOTE — Telephone Encounter (Signed)
Pt called back returning your call. I advised pt with the message. Pt understood message. Thank you!

## 2016-11-24 ENCOUNTER — Other Ambulatory Visit: Payer: Self-pay | Admitting: Internal Medicine

## 2016-12-26 ENCOUNTER — Other Ambulatory Visit (INDEPENDENT_AMBULATORY_CARE_PROVIDER_SITE_OTHER): Payer: BLUE CROSS/BLUE SHIELD

## 2016-12-26 ENCOUNTER — Other Ambulatory Visit: Payer: BLUE CROSS/BLUE SHIELD | Admitting: Internal Medicine

## 2016-12-26 DIAGNOSIS — I10 Essential (primary) hypertension: Secondary | ICD-10-CM | POA: Diagnosis not present

## 2016-12-26 DIAGNOSIS — E782 Mixed hyperlipidemia: Secondary | ICD-10-CM | POA: Diagnosis not present

## 2016-12-26 LAB — COMPREHENSIVE METABOLIC PANEL
ALT: 14 U/L (ref 0–35)
AST: 19 U/L (ref 0–37)
Albumin: 4.1 g/dL (ref 3.5–5.2)
Alkaline Phosphatase: 69 U/L (ref 39–117)
BUN: 12 mg/dL (ref 6–23)
CO2: 28 mEq/L (ref 19–32)
Calcium: 9.5 mg/dL (ref 8.4–10.5)
Chloride: 102 mEq/L (ref 96–112)
Creatinine, Ser: 0.8 mg/dL (ref 0.40–1.20)
GFR: 76.95 mL/min (ref >60.00)
Glucose, Bld: 90 mg/dL (ref 70–99)
Potassium: 4.7 mEq/L (ref 3.5–5.1)
Sodium: 137 mEq/L (ref 135–145)
Total Bilirubin: 0.9 mg/dL (ref 0.2–1.2)
Total Protein: 6.5 g/dL (ref 6.0–8.3)

## 2016-12-26 LAB — LIPID PANEL
Cholesterol: 183 mg/dL (ref 0–200)
HDL: 60.6 mg/dL (ref >39.00)
LDL Cholesterol: 104 mg/dL — ABNORMAL HIGH (ref 0–99)
NonHDL: 122.63
Total CHOL/HDL Ratio: 3
Triglycerides: 94 mg/dL (ref 0.0–149.0)
VLDL: 18.8 mg/dL (ref 0.0–40.0)

## 2016-12-29 ENCOUNTER — Ambulatory Visit: Payer: BLUE CROSS/BLUE SHIELD | Admitting: Internal Medicine

## 2016-12-30 DIAGNOSIS — H43812 Vitreous degeneration, left eye: Secondary | ICD-10-CM | POA: Diagnosis not present

## 2017-01-19 ENCOUNTER — Encounter: Payer: Self-pay | Admitting: Internal Medicine

## 2017-01-19 ENCOUNTER — Ambulatory Visit (INDEPENDENT_AMBULATORY_CARE_PROVIDER_SITE_OTHER): Payer: BLUE CROSS/BLUE SHIELD | Admitting: Internal Medicine

## 2017-01-19 DIAGNOSIS — L820 Inflamed seborrheic keratosis: Secondary | ICD-10-CM | POA: Diagnosis not present

## 2017-01-19 DIAGNOSIS — M19049 Primary osteoarthritis, unspecified hand: Secondary | ICD-10-CM | POA: Diagnosis not present

## 2017-01-19 DIAGNOSIS — I10 Essential (primary) hypertension: Secondary | ICD-10-CM

## 2017-01-19 DIAGNOSIS — E782 Mixed hyperlipidemia: Secondary | ICD-10-CM | POA: Diagnosis not present

## 2017-01-19 DIAGNOSIS — F5104 Psychophysiologic insomnia: Secondary | ICD-10-CM | POA: Diagnosis not present

## 2017-01-19 MED ORDER — ZOLPIDEM TARTRATE 5 MG PO TABS: 5.0000 mg | ORAL_TABLET | Freq: Every evening | ORAL | 1 refills | Status: DC | PRN

## 2017-01-19 MED ORDER — SIMVASTATIN 20 MG PO TABS: 20.0000 mg | ORAL_TABLET | ORAL | 1 refills | Status: DC

## 2017-01-19 MED ORDER — DICLOFENAC SODIUM 2 % TD SOLN: 2.0000 "application " | Freq: Two times a day (BID) | TRANSDERMAL | 6 refills | Status: DC | PRN

## 2017-01-19 MED ORDER — LEVOFLOXACIN 500 MG PO TABS: 500.0000 mg | ORAL_TABLET | Freq: Every day | ORAL | 0 refills | Status: DC

## 2017-01-19 MED ORDER — ZOSTER VAC RECOMB ADJUVANTED 50 MCG/0.5ML IM SUSR: 0.5000 mL | Freq: Once | INTRAMUSCULAR | 1 refills | Status: AC

## 2017-01-19 MED ORDER — ESTRADIOL 0.1 MG/GM VA CREA: 1.0000 | TOPICAL_CREAM | VAGINAL | 2 refills | Status: DC

## 2017-01-19 NOTE — Assessment & Plan Note (Signed)
Well controlled on current regimen. Renal function stable, no changes today.  Lab Results  Component Value Date   CREATININE 0.80 12/26/2016   Lab Results  Component Value Date   NA 137 12/26/2016   K 4.7 12/26/2016   CL 102 12/26/2016   CO2 28 12/26/2016

## 2017-01-19 NOTE — Progress Notes (Signed)
Subjective:  Patient ID: Amber Sullivan, female    DOB: 07-13-1955  Age: 61 y.o. MRN: [id number]  CC: Diagnoses of Chronic insomnia, Mixed hyperlipidemia, Essential hypertension, and CMC arthritis were pertinent to this visit.  HPI Amber Sullivan presents for 6 month follow up on insomnia managed with ambien,  hypertension managed with losartan and hyperlipidemia managed with simvastatin  .Patient is taking her medications as prescribed and notes no adverse effects.  Home BP readings have been done about once per week and are  generally < 130/80 .  She is avoiding added salt in her diet and exercising regularly about 4 times per week for exercise  .  OA:  She is using diclofenac gel on thumb joints ,  Having some pain with opening jars, notes decreased ablility to play Chopin on the piano.   Eye specialist  for recurrence of floaters    No retinal issues  Thus far  Takes theratears for dry eye    Outpatient Medications Prior to Visit  Medication Sig Dispense Refill  . benzonatate (TESSALON) 200 MG capsule Take 1 capsule (200 mg total) by mouth 3 (three) times daily as needed for cough. 60 capsule 1  . Calcium Carbonate-Vit D-Min (CALTRATE 600+D PLUS MINERALS) 600-800 MG-UNIT TABS Take 1 tablet by mouth daily. Taking 1 tablet every other day    . diphenhydrAMINE (BENADRYL) 25 MG tablet Take 25 mg by mouth daily.    Marland Kitchen losartan (COZAAR) 100 MG tablet TAKE 1 TABLET DAILY 90 tablet 1  . Diclofenac Sodium (PENNSAID) 2 % SOLN Place 2 application onto the skin 2 (two) times daily. (Patient taking differently: Place 2 application onto the skin 2 (two) times daily as needed. ) 112 g 3  . simvastatin (ZOCOR) 20 MG tablet TAKE 1 TABLET EVERY OTHER DAY 90 tablet 1  . zolpidem (AMBIEN) 5 MG tablet Take 1 tablet (5 mg total) by mouth at bedtime as needed for sleep. 90 tablet 0  . fish oil-omega-3 fatty acids 1000 MG capsule Take 2 g by mouth daily.    . predniSONE (DELTASONE) 10 MG tablet 6  tablets on Day 1 , then reduce by 1 tablet daily until gone (Patient not taking: Reported on 01/19/2017) 21 tablet 0   No facility-administered medications prior to visit.     Review of Systems;  Patient denies headache, fevers, malaise, unintentional weight loss, skin rash, eye pain, sinus congestion and sinus pain, sore throat, dysphagia,  hemoptysis , cough, dyspnea, wheezing, chest pain, palpitations, orthopnea, edema, abdominal pain, nausea, melena, diarrhea, constipation, flank pain, dysuria, hematuria, urinary  Frequency, nocturia, numbness, tingling, seizures,  Focal weakness, Loss of consciousness,  Tremor, insomnia, depression, anxiety, and suicidal ideation.      Objective:  BP 128/78 (BP Location: Left Arm, Patient Position: Sitting, Cuff Size: Normal)   Pulse 69   Temp 97.6 F (36.4 C) (Oral)   Resp 15   Ht 5\' 5"  (1.651 m)   Wt 148 lb 12.8 oz (67.5 kg)   SpO2 98%   BMI 24.76 kg/m   BP Readings from Last 3 Encounters:  01/19/17 128/78  08/13/16 118/61  06/27/16 120/70    Wt Readings from Last 3 Encounters:  01/19/17 148 lb 12.8 oz (67.5 kg)  08/13/16 145 lb (65.8 kg)  06/27/16 146 lb 9.6 oz (66.5 kg)    General appearance: alert, cooperative and appears stated age Ears: normal TM's and external ear canals both ears Throat: lips, mucosa, and tongue normal; teeth  and gums normal Neck: no adenopathy, no carotid bruit, supple, symmetrical, trachea midline and thyroid not enlarged, symmetric, no tenderness/mass/nodules Back: symmetric, no curvature. ROM normal. No CVA tenderness. Lungs: clear to auscultation bilaterally Heart: regular rate and rhythm, S1, S2 normal, no murmur, click, rub or gallop Abdomen: soft, non-tender; bowel sounds normal; no masses,  no organomegaly Pulses: 2+ and symmetric Skin: Skin color, texture, turgor normal. No rashes or lesions Lymph nodes: Cervical, supraclavicular, and axillary nodes normal.  No results found for: HGBA1C  Lab  Results  Component Value Date   CREATININE 0.80 12/26/2016   CREATININE 0.81 04/21/2016   CREATININE 0.84 07/25/2015    Lab Results  Component Value Date   WBC 6.3 11/19/2015   HGB 13.6 11/19/2015   HCT 40.7 11/19/2015   PLT 208.0 11/19/2015   GLUCOSE 90 12/26/2016   CHOL 183 12/26/2016   TRIG 94.0 12/26/2016   HDL 60.60 12/26/2016   LDLDIRECT 109.0 10/25/2014   LDLCALC 104 (H) 12/26/2016   ALT 14 12/26/2016   AST 19 12/26/2016   NA 137 12/26/2016   K 4.7 12/26/2016   CL 102 12/26/2016   CREATININE 0.80 12/26/2016   BUN 12 12/26/2016   CO2 28 12/26/2016   TSH 2.07 11/19/2015   MICROALBUR 0.5 08/30/2012    No results found.  Assessment & Plan:   Problem List Items Addressed This Visit    Chronic insomnia    Managed with prn use of ambien.. Refills given      CMC arthritis    Managed with Pennsaid gel bid. Refills given       Hyperlipidemia    LDL and triglycerides are at goal on current medications. He has no side effects and liver enzymes are normal. No changes today   Lab Results  Component Value Date   CHOL 183 12/26/2016   HDL 60.60 12/26/2016   LDLCALC 104 (H) 12/26/2016   LDLDIRECT 109.0 10/25/2014   TRIG 94.0 12/26/2016   CHOLHDL 3 12/26/2016   Lab Results  Component Value Date   ALT 14 12/26/2016   AST 19 12/26/2016   ALKPHOS 69 12/26/2016   BILITOT 0.9 12/26/2016         Relevant Medications   simvastatin (ZOCOR) 20 MG tablet   Hypertension    Well controlled on current regimen. Renal function stable, no changes today.  Lab Results  Component Value Date   CREATININE 0.80 12/26/2016   Lab Results  Component Value Date   NA 137 12/26/2016   K 4.7 12/26/2016   CL 102 12/26/2016   CO2 28 12/26/2016         Relevant Medications   simvastatin (ZOCOR) 20 MG tablet    A total of 25 minutes of face to face time was spent with patient more than half of which was spent in counselling about the above mentioned conditions  and  coordination of care   I have discontinued Amber Sullivan fish oil-omega-3 fatty acids and predniSONE. I have changed her ESTRACE VAGINAL to estradiol. I have also changed her Diclofenac Sodium and simvastatin. Additionally, I am having her start on Zoster Vaccine Adjuvanted and levofloxacin. Lastly, I am having her maintain her CALTRATE 600+D PLUS MINERALS, diphenhydrAMINE, benzonatate, losartan, and zolpidem.  Meds ordered this encounter  Medications  . Diclofenac Sodium (PENNSAID) 2 % SOLN    Sig: Place 2 application onto the skin 2 (two) times daily as needed.    Dispense:  112 g    Refill:  6  . zolpidem (AMBIEN) 5 MG tablet    Sig: Take 1 tablet (5 mg total) by mouth at bedtime as needed for sleep.    Dispense:  90 tablet    Refill:  1  . estradiol (ESTRACE VAGINAL) 0.1 MG/GM vaginal cream    Sig: Place 1 Applicatorful vaginally 3 (three) times a week.    Dispense:  42.5 g    Refill:  2  . simvastatin (ZOCOR) 20 MG tablet    Sig: Take 1 tablet (20 mg total) by mouth every other day.    Dispense:  90 tablet    Refill:  1  . Zoster Vaccine Adjuvanted [facility]) injection    Sig: Inject 0.5 mLs into the muscle once for 1 dose.    Dispense:  1 each    Refill:  1  . levofloxacin (LEVAQUIN) 500 MG tablet    Sig: Take 1 tablet (500 mg total) by mouth daily.    Dispense:  7 tablet    Refill:  0    Medications Discontinued During This Encounter  Medication Reason  . fish oil-omega-3 fatty acids 1000 MG capsule Patient has not taken in last 30 days  . predniSONE (DELTASONE) 10 MG tablet Completed Course  . Diclofenac Sodium (PENNSAID) 2 % SOLN Reorder  . zolpidem (AMBIEN) 5 MG tablet Reorder  . simvastatin (ZOCOR) 20 MG tablet Reorder    Follow-up: Return for CPE in March.   Crecencio Mc, MD

## 2017-01-19 NOTE — Assessment & Plan Note (Signed)
Managed with prn use of ambien.. Refills given 

## 2017-01-19 NOTE — Assessment & Plan Note (Signed)
Managed with Pennsaid gel bid. Refills given

## 2017-01-19 NOTE — Patient Instructions (Addendum)
Check on your last mammogram my charts  say 2017 was your last one.   Take the levaquin with you to [place].  3 days for UTI,  7 days for sinusitis (head cold that becomes painful or accompanied by fevers)  Please take a probiotic ( Align, Floraque or Culturelle) while you are on the antibiotic to prevent a serious antibiotic associated diarrhea  Called clostrudium dificile colitis and a vaginal yeast infection

## 2017-01-19 NOTE — Assessment & Plan Note (Signed)
LDL and triglycerides are at goal on current medications. He has no side effects and liver enzymes are normal. No changes today   Lab Results  Component Value Date   CHOL 183 12/26/2016   HDL 60.60 12/26/2016   LDLCALC 104 (H) 12/26/2016   LDLDIRECT 109.0 10/25/2014   TRIG 94.0 12/26/2016   CHOLHDL 3 12/26/2016   Lab Results  Component Value Date   ALT 14 12/26/2016   AST 19 12/26/2016   ALKPHOS 69 12/26/2016   BILITOT 0.9 12/26/2016

## 2017-01-21 ENCOUNTER — Telehealth: Payer: Self-pay

## 2017-01-21 ENCOUNTER — Telehealth: Payer: Self-pay | Admitting: *Deleted

## 2017-01-21 NOTE — Telephone Encounter (Signed)
Received a fax from pt's pharmacy stating that the pennsaid 2% topical solution is not covered by the pt's insurance. The preferred alternative is diclofenac sodium. Is it okay to have the pharmacy change it to the preferred drug?

## 2017-01-21 NOTE — Telephone Encounter (Signed)
Copied from [place] [id number]. Topic: Inquiry >> Jan 19, 2017 12:49 PM Oliver Pila B wrote: Reason for CRM: pt states [facility] will resend mammogram results from 4.3.18 they will send today, inform [place]'s nurse

## 2017-01-21 NOTE — Telephone Encounter (Signed)
Yes   Too many choices in epic  They can change to the preferred drug

## 2017-01-21 NOTE — Telephone Encounter (Signed)
Thank you :)

## 2017-01-22 ENCOUNTER — Telehealth: Payer: Self-pay

## 2017-01-22 NOTE — Telephone Encounter (Signed)
Spoke with pt and informed her that the medication has been changed at the pharmacy.

## 2017-01-22 NOTE — Telephone Encounter (Signed)
Mediation has been changed.

## 2017-01-22 NOTE — Telephone Encounter (Signed)
Copied from [place]. Topic: General - Other >> Jan 21, 2017  3:36 PM Yvette Rack wrote: Reason for CRM: patient calling to see if an alternative for Pennsaid topical solution that the insurance want cover had been called into the [facility] pharmacy  It look like maybe the provider has given permission to give pt Diclofenac please respond

## 2017-01-23 ENCOUNTER — Telehealth: Payer: Self-pay

## 2017-01-23 NOTE — Telephone Encounter (Signed)
Copied from [place] #[id number]. Topic: Inquiry >> Jan 22, 2017  1:16 PM Pricilla Handler wrote: Reason for CRM: Patient Called regarding Medication Pennsaid Diclofenac. Patient stated that this medication is a compound medication that needs to be filled at [facility] [phone]) [place] [address]. Please call patient ASAP.        Thank You!!!

## 2017-01-23 NOTE — Telephone Encounter (Signed)
Spoke with pt and she stated that this has already been taking care of and she is going to pick up the rx today.

## 2017-01-23 NOTE — Telephone Encounter (Signed)
LMTCB

## 2017-02-02 ENCOUNTER — Telehealth: Payer: Self-pay | Admitting: Internal Medicine

## 2017-02-02 MED ORDER — LOSARTAN POTASSIUM 100 MG PO TABS: 100.0000 mg | ORAL_TABLET | Freq: Every day | ORAL | 1 refills | Status: DC

## 2017-02-02 NOTE — Telephone Encounter (Signed)
Patient calling back(did try the office with no answer), [facility], her losartan is not affected. Call back [phone]

## 2017-02-02 NOTE — Telephone Encounter (Signed)
Spoke with pt and informed her that she would need to call her pharmacy and find out if the losartan she has has been recalled or not. If it has pt was told to give [place] a call so that we can let Dr. Derrel Nip know incase something else needs to be sent in.

## 2017-02-02 NOTE — Telephone Encounter (Signed)
Copied from [place] [phone] [id number]. Topic: Quick Communication - See Telephone Encounter >> Feb 02, 2017  9:31 AM Oneta Rack wrote: CRM for notification. See Telephone encounter for:   02/02/17.  Relation to pt: self  Call back number: [phone] Pharmacy: [facility], [place] [phone] ([phone] (Fax)    Reason for call:  Patient states noted on [facility] losartan (COZAAR) 100 MG tablet is on recall, patient seeking clinical advice, please advise

## 2017-02-02 NOTE — Telephone Encounter (Signed)
LMTCB. Please transfer pt to our office, need to speak with pt.

## 2017-02-02 NOTE — Telephone Encounter (Signed)
Please advise 

## 2017-02-04 DIAGNOSIS — H43811 Vitreous degeneration, right eye: Secondary | ICD-10-CM | POA: Diagnosis not present

## 2017-04-08 ENCOUNTER — Other Ambulatory Visit (HOSPITAL_COMMUNITY)
Admission: RE | Admit: 2017-04-08 | Discharge: 2017-04-08 | Disposition: A | Discharge status: Home / Self Care | Payer: BLUE CROSS/BLUE SHIELD | Source: Ambulatory Visit | Attending: Internal Medicine | Admitting: Internal Medicine

## 2017-04-08 ENCOUNTER — Encounter: Payer: Self-pay | Admitting: Internal Medicine

## 2017-04-08 ENCOUNTER — Ambulatory Visit (INDEPENDENT_AMBULATORY_CARE_PROVIDER_SITE_OTHER): Payer: BLUE CROSS/BLUE SHIELD | Admitting: Internal Medicine

## 2017-04-08 VITALS — BP 120/74 | HR 74 | Temp 97.5°F | Resp 15 | Ht 65.0 in | Wt 151.4 lb

## 2017-04-08 DIAGNOSIS — Z124 Encounter for screening for malignant neoplasm of cervix: Secondary | ICD-10-CM | POA: Insufficient documentation

## 2017-04-08 DIAGNOSIS — I10 Essential (primary) hypertension: Secondary | ICD-10-CM | POA: Diagnosis not present

## 2017-04-08 DIAGNOSIS — E782 Mixed hyperlipidemia: Secondary | ICD-10-CM

## 2017-04-08 DIAGNOSIS — Z0000 Encounter for general adult medical examination without abnormal findings: Secondary | ICD-10-CM | POA: Diagnosis not present

## 2017-04-08 MED ORDER — SIMVASTATIN 20 MG PO TABS: 20.0000 mg | ORAL_TABLET | ORAL | 1 refills | Status: DC

## 2017-04-08 NOTE — Patient Instructions (Addendum)
WE DISCUSSED THE ALTERNATIVES TO BISPHOSPHONATES, NAMELY PROLIA AND EVISTA FOR TREATMENT OF OSTEOPOROSIS   I will order your mammogram once you let me know the date of your dental exam in [place]   Denosumab injection What is this medicine? DENOSUMAB (den oh sue mab) slows bone breakdown. Prolia is used to treat osteoporosis in women after menopause and in men. Delton See is used to treat a high calcium level due to cancer and to prevent bone fractures and other bone problems caused by multiple myeloma or cancer bone metastases. Delton See is also used to treat giant cell tumor of the bone. This medicine may be used for other purposes; ask your health care provider or pharmacist if you have questions. COMMON BRAND NAME(S): Prolia, XGEVA What should I tell my health care provider before I take this medicine? They need to know if you have any of these conditions: -dental disease -having surgery or tooth extraction -infection -kidney disease -low levels of calcium or Vitamin D in the blood -malnutrition -on hemodialysis -skin conditions or sensitivity -thyroid or parathyroid disease -an unusual reaction to denosumab, other medicines, foods, dyes, or preservatives -pregnant or trying to get pregnant -breast-feeding How should I use this medicine? This medicine is for injection under the skin. It is given by a health care professional in a hospital or clinic setting. If you are getting Prolia, a special MedGuide will be given to you by the pharmacist with each prescription and refill. Be sure to read this information carefully each time. For Prolia, talk to your pediatrician regarding the use of this medicine in children. Special care may be needed. For Delton See, talk to your pediatrician regarding the use of this medicine in children. While this drug may be prescribed for children as young as 13 years for selected conditions, precautions do apply. Overdosage: If you think you have taken too much of this  medicine contact a poison control center or emergency room at once. NOTE: This medicine is only for you. Do not share this medicine with others. What if I miss a dose? It is important not to miss your dose. Call your doctor or health care professional if you are unable to keep an appointment. What may interact with this medicine? Do not take this medicine with any of the following medications: -other medicines containing denosumab This medicine may also interact with the following medications: -medicines that lower your chance of fighting infection -steroid medicines like prednisone or cortisone This list may not describe all possible interactions. Give your health care provider a list of all the medicines, herbs, non-prescription drugs, or dietary supplements you use. Also tell them if you smoke, drink alcohol, or use illegal drugs. Some items may interact with your medicine. What should I watch for while using this medicine? Visit your doctor or health care professional for regular checks on your progress. Your doctor or health care professional may order blood tests and other tests to see how you are doing. Call your doctor or health care professional for advice if you get a fever, chills or sore throat, or other symptoms of a cold or flu. Do not treat yourself. This drug may decrease your body's ability to fight infection. Try to avoid being around people who are sick. You should make sure you get enough calcium and vitamin D while you are taking this medicine, unless your doctor tells you not to. Discuss the foods you eat and the vitamins you take with your health care professional. See your dentist regularly.  Brush and floss your teeth as directed. Before you have any dental work done, tell your dentist you are receiving this medicine. Do not become pregnant while taking this medicine or for 5 months after stopping it. Talk with your doctor or health care professional about your birth control  options while taking this medicine. Women should inform their doctor if they wish to become pregnant or think they might be pregnant. There is a potential for serious side effects to an unborn child. Talk to your health care professional or pharmacist for more information. What side effects may I notice from receiving this medicine? Side effects that you should report to your doctor or health care professional as soon as possible: -allergic reactions like skin rash, itching or hives, swelling of the face, lips, or tongue -bone pain -breathing problems -dizziness -jaw pain, especially after dental work -redness, blistering, peeling of the skin -signs and symptoms of infection like fever or chills; cough; sore throat; pain or trouble passing urine -signs of low calcium like fast heartbeat, muscle cramps or muscle pain; pain, tingling, numbness in the hands or feet; seizures -unusual bleeding or bruising -unusually weak or tired Side effects that usually do not require medical attention (report to your doctor or health care professional if they continue or are bothersome): -constipation -diarrhea -headache -joint pain -loss of appetite -muscle pain -runny nose -tiredness -upset stomach This list may not describe all possible side effects. Call your doctor for medical advice about side effects. You may report side effects to FDA at 1-800-FDA-1088. Where should I keep my medicine? This medicine is only given in a clinic, doctor's office, or other health care setting and will not be stored at home. NOTE: This sheet is a summary. It may not cover all possible information. If you have questions about this medicine, talk to your doctor, pharmacist, or health care provider.  2018 Elsevier/Gold Standard (2016-01-29 19:17:21)  Raloxifene tablets What is this medicine? RALOXIFENE (ral OX i feen) reduces the amount of calcium lost from bones. It is used to treat and prevent osteoporosis in women  who have experienced menopause. It may also help prevent invasive breast cancer in certain women who have a high risk for breast cancer. This medicine may be used for other purposes; ask your health care provider or pharmacist if you have questions. COMMON BRAND NAME(S): Evista What should I tell my health care provider before I take this medicine? They need to know if you have any of these conditions: -a history of blood clots -cancer -heart disease or recent heart attack -high levels of triglycerides (blood fat) in the blood -history of stroke -kidney disease -liver disease -premenopausal -smoke tobacco -an unusual or allergic reaction to raloxifene, other medicines, foods, dyes, or preservatives -pregnant or trying to get pregnant -breast-feeding How should I use this medicine? Take this medicine by mouth with a glass of water. Follow the directions on the prescription label. The tablets can be taken with or without food. Take your doses at regular intervals. Do not take your medicine more often than directed. A special MedGuide will be given to you by the pharmacist with each prescription and refill. Be sure to read this information carefully each time. Talk to your pediatrician regarding the use of this medicine in children. Special care may be needed. Overdosage: If you think you have taken too much of this medicine contact a poison control center or emergency room at once. NOTE: This medicine is only for you. Do not  share this medicine with others. What if I miss a dose? If you miss a dose, take it as soon as you can. If it is almost time for your next dose, take only that dose. Do not take double or extra doses. What may interact with this medicine? -cholestyramine -female hormones, like estrogens -warfarin This list may not describe all possible interactions. Give your health care provider a list of all the medicines, herbs, non-prescription drugs, or dietary supplements you  use. Also tell them if you smoke, drink alcohol, or use illegal drugs. Some items may interact with your medicine. What should I watch for while using this medicine? Visit your doctor or health care professional for regular checks on your progress. Do not stop taking this medicine except on the advice of your doctor or health care professional. If you are taking this medicine to reduce your risk of getting breast cancer, you should know that this medicine does not prevent all types of breast cancer. Talk to your doctor if you have questions. This medicine does not prevent hot flashes. It may cause hot flashes in some patients at the start of therapy. You should make sure that you get enough calcium and vitamin D while you are taking this medicine. Discuss the foods you eat and the vitamins you take with your health care professional. Exercise may help to prevent bone loss. Discuss your exercise needs with your doctor or health care professional. This medicine can rarely cause blood clots. If you are going to have surgery, tell your doctor or health care professional that you are taking this medicine. This medicine should be stopped at least 3 days before surgery. After surgery, it should be restarted only after you are walking again. It should not be restarted while you still need long periods of bed rest. You should not smoke while taking this medicine. Smoking may increase your risk of blood clots or stroke. If you have any reason to think you are pregnant; stop taking this medicine at once and contact your doctor or health care professional. Do not breast feed while taking this medicine. What side effects may I notice from receiving this medicine? Side effects that you should report to your doctor or health care professional as soon as possible: -allergic reactions like skin rash, itching or hives, swelling of the face, lips, or tongue) -breast tissue changes or discharge -signs and symptoms of a  blood clot such as breathing problems; changes in vision; chest pain; severe, sudden headache; pain, swelling, warmth in the leg; trouble speaking; sudden numbness or weakness of the face, arm or leg -signs and symptoms of a stroke like changes in vision; confusion; trouble speaking or understanding; severe headaches; sudden numbness or weakness of the face, arm or leg; trouble walking; dizziness; loss of balance or coordination -vaginal discharge that is bloody, brown, or rust Side effects that usually do not require medical attention (report to your doctor or health care professional if they continue or are bothersome): -hot flashes -joint pain -leg cramps -sweating -swelling of the ankles, feet, hands This list may not describe all possible side effects. Call your doctor for medical advice about side effects. You may report side effects to FDA at 1-800-FDA-1088. Where should I keep my medicine? Keep out of the reach of children. Store at room temperature between 15 and 30 degrees C (59 and 86 degrees F). Throw away any unused medicine after the expiration date. NOTE: This sheet is a summary. It may not cover  all possible information. If you have questions about this medicine, talk to your doctor, pharmacist, or health care provider.  2018 Elsevier/Gold Standard (2016-02-13 17:15:34)

## 2017-04-08 NOTE — Progress Notes (Signed)
Patient ID: Amber Sullivan, female    DOB: [birth year]  Age: 62 y.o. MRN: [id number]  The patient is here for preventive examination and management of other chronic and acute problems.   Due for Pap SMEAR  Mammogram needed in  April 2019 in [place] \ Diagnostic colonsocopy hyperplastic polyps 2018.  10 yr follow up     The risk factors are reflected in the social history.  The roster of all physicians providing medical care to patient - is listed in the Snapshot section of the chart.  Activities of daily living:  The patient is 100% independent in all ADLs: dressing, toileting, feeding as well as independent mobility  Home safety : The patient has smoke detectors in the home. They wear seatbelts.  There are no firearms at home. There is no violence in the home.   There is no risks for hepatitis, STDs or HIV. There is no   history of blood transfusion. They have no travel history to infectious disease endemic areas of the world.  The patient has seen their dentist in the last six month. They have seen their eye doctor in the last year.     They have deferred audiologic testing in the last year.   They do not  have excessive sun exposure. Discussed the need for sun protection: hats, long sleeves and use of sunscreen if there is significant sun exposure.   Diet: the importance of a healthy diet is discussed. They do have a healthy diet.  The benefits of regular aerobic exercise were discussed. She walks 4 times per week ,  20 minutes.   Depression screen: there are no signs or vegative symptoms of depression- irritability, change in appetite, anhedonia, sadness/tearfullness.  The following portions of the patient's history were reviewed and updated as appropriate: allergies, current medications, past family history, past medical history,  past surgical history, past social history  and problem list.  Visual acuity was not assessed per patient preference since she has regular follow up  with her ophthalmologist. Hearing and body mass index were assessed and reviewed.   During the course of the visit the patient was educated and counseled about appropriate screening and preventive services including : fall prevention , diabetes screening, nutrition counseling, colorectal cancer screening, and recommended immunizations.    CC: The primary encounter diagnosis was Cervical cancer screening. Diagnoses of Encounter for preventive health examination, Essential hypertension, and Mixed hyperlipidemia were also pertinent to this visit.  History Amber Sullivan has a past medical history of Hyperlipidemia, Hypertension (2003), and Positive colorectal cancer screening using Cologuard test (04/20/2016).   She has a past surgical history that includes Cholecystectomy (2005); Squamous cell carcinoma excision (2007); Mohs surgery (2007); and Colonoscopy with propofol (N/A, 08/13/2016).   Her family history includes Diabetes in her mother; Heart attack in her father, maternal grandfather, and paternal grandfather; Heart disease in her father, paternal grandfather, and paternal grandmother; Hyperlipidemia in her father; Hypertension in her father and mother; Stroke (age of onset: 15) in her mother.She reports that she has never smoked. She has never used smokeless tobacco. She reports that she drinks alcohol. She reports that she does not use drugs.  Outpatient Medications Prior to Visit  Medication Sig Dispense Refill  . benzonatate (TESSALON) 200 MG capsule Take 1 capsule (200 mg total) by mouth 3 (three) times daily as needed for cough. 60 capsule 1  . Calcium Carbonate-Vit D-Min (CALTRATE 600+D PLUS MINERALS) 600-800 MG-UNIT TABS Take 1 tablet by mouth daily. Taking  1 tablet every other day    . Diclofenac Sodium (PENNSAID) 2 % SOLN Place 2 application onto the skin 2 (two) times daily as needed. 112 g 6  . diphenhydrAMINE (BENADRYL) 25 MG tablet Take 25 mg by mouth daily.    Marland Kitchen estradiol (ESTRACE  VAGINAL) 0.1 MG/GM vaginal cream Place 1 Applicatorful vaginally 3 (three) times a week. 42.5 g 2  . losartan (COZAAR) 100 MG tablet Take 1 tablet (100 mg total) by mouth daily. 90 tablet 1  . zolpidem (AMBIEN) 5 MG tablet Take 1 tablet (5 mg total) by mouth at bedtime as needed for sleep. 90 tablet 1  . simvastatin (ZOCOR) 20 MG tablet Take 1 tablet (20 mg total) by mouth every other day. 90 tablet 1  . levofloxacin (LEVAQUIN) 500 MG tablet Take 1 tablet (500 mg total) by mouth daily. (Patient not taking: Reported on 04/08/2017) 7 tablet 0   No facility-administered medications prior to visit.     Review of Systems   Patient denies headache, fevers, malaise, unintentional weight loss, skin rash, eye pain, sinus congestion and sinus pain, sore throat, dysphagia,  hemoptysis , cough, dyspnea, wheezing, chest pain, palpitations, orthopnea, edema, abdominal pain, nausea, melena, diarrhea, constipation, flank pain, dysuria, hematuria, urinary  Frequency, nocturia, numbness, tingling, seizures,  Focal weakness, Loss of consciousness,  Tremor, insomnia, depression, anxiety, and suicidal ideation.     Objective:  BP 120/74 (BP Location: Left Arm, Patient Position: Sitting, Cuff Size: Normal)   Pulse 74   Temp (!) 97.5 F (36.4 C) (Oral)   Resp 15   Ht 5\' 5"  (1.651 m)   Wt 151 lb 6.4 oz (68.7 kg)   SpO2 99%   BMI 25.19 kg/m   Physical Exam   General Appearance:    Alert, cooperative, no distress, appears stated age  Head:    Normocephalic, without obvious abnormality, atraumatic  Eyes:    PERRL, conjunctiva/corneas clear, EOM's intact, fundi    benign, both eyes  Ears:    Normal TM's and external ear canals, both ears  Nose:   Nares normal, septum midline, mucosa normal, no drainage    or sinus tenderness  Throat:   Lips, mucosa, and tongue normal; teeth and gums normal  Neck:   Supple, symmetrical, trachea midline, no adenopathy;    thyroid:  no enlargement/tenderness/nodules; no  carotid   bruit or JVD  Back:     Symmetric, no curvature, ROM normal, no CVA tenderness  Lungs:     Clear to auscultation bilaterally, respirations unlabored  Chest Wall:    No tenderness or deformity   Heart:    Regular rate and rhythm, S1 and S2 normal, no murmur, rub   or gallop  Breast Exam:    No tenderness, masses, or nipple abnormality  Abdomen:     Soft, non-tender, bowel sounds active all four quadrants,    no masses, no organomegaly  Genitalia:    Pelvic: cervix normal in appearance, external genitalia normal, no adnexal masses or tenderness, no cervical motion tenderness, rectovaginal septum normal, uterus normal size, shape, and consistency and vagina normal without discharge  Extremities:   Extremities normal, atraumatic, no cyanosis or edema  Pulses:   2+ and symmetric all extremities  Skin:   Skin color, texture, turgor normal, no rashes or lesions  Lymph nodes:   Cervical, supraclavicular, and axillary nodes normal  Neurologic:   CNII-XII intact, normal strength, sensation and reflexes    throughout      Assessment &  Plan:   Problem List Items Addressed This Visit    Hypertension    Well controlled on current regimen. Renal function stable.  No changes today   Lab Results  Component Value Date   CREATININE 0.80 12/26/2016   Lab Results  Component Value Date   NA 137 12/26/2016   K 4.7 12/26/2016   CL 102 12/26/2016   CO2 28 12/26/2016        Relevant Medications   simvastatin (ZOCOR) 20 MG tablet   Hyperlipidemia    LDL and triglycerides are at goal on current medications. He has no side effects and liver enzymes are normal. No changes today  Lab Results  Component Value Date   CHOL 183 12/26/2016   HDL 60.60 12/26/2016   LDLCALC 104 (H) 12/26/2016   LDLDIRECT 109.0 10/25/2014   TRIG 94.0 12/26/2016   CHOLHDL 3 12/26/2016   Lab Results  Component Value Date   ALT 14 12/26/2016   AST 19 12/26/2016   ALKPHOS 69 12/26/2016   BILITOT 0.9  12/26/2016         Relevant Medications   simvastatin (ZOCOR) 20 MG tablet   Encounter for preventive health examination    Annual comprehensive preventive exam was done as well as an evaluation and management of chronic conditions .  During the course of the visit the patient was educated and counseled about appropriate screening and preventive services including :  diabetes screening, lipid analysis with projected  10 year  risk for CAD , nutrition counseling, breast, cervical and colorectal cancer screening, and recommended immunizations.  Printed recommendations for health maintenance screenings was given  PAP smear normal.        Other Visit Diagnoses    Cervical cancer screening    -  Primary   Relevant Orders   Cytology - PAP (Completed)      I have discontinued Amber Sullivan's levofloxacin. I am also having her maintain her CALTRATE 600+D PLUS MINERALS, diphenhydrAMINE, benzonatate, Diclofenac Sodium, zolpidem, estradiol, losartan, and simvastatin.  Meds ordered this encounter  Medications  . simvastatin (ZOCOR) 20 MG tablet    Sig: Take 1 tablet (20 mg total) by mouth every other day.    Dispense:  90 tablet    Refill:  1    Medications Discontinued During This Encounter  Medication Reason  . levofloxacin (LEVAQUIN) 500 MG tablet Prescription never filled  . simvastatin (ZOCOR) 20 MG tablet Reorder    Follow-up: Return in about 6 months (around 10/09/2017).   Crecencio Mc, MD

## 2017-04-09 LAB — CYTOLOGY - PAP
Diagnosis: NEGATIVE
HPV: NOT DETECTED

## 2017-04-09 NOTE — Assessment & Plan Note (Addendum)
Annual comprehensive preventive exam was done as well as an evaluation and management of chronic conditions .  During the course of the visit the patient was educated and counseled about appropriate screening and preventive services including :  diabetes screening, lipid analysis with projected  10 year  risk for CAD , nutrition counseling, breast, cervical and colorectal cancer screening, and recommended immunizations.  Printed recommendations for health maintenance screenings was given  PAP smear normal.

## 2017-04-09 NOTE — Assessment & Plan Note (Signed)
LDL and triglycerides are at goal on current medications. He has no side effects and liver enzymes are normal. No changes today  Lab Results  Component Value Date   CHOL 183 12/26/2016   HDL 60.60 12/26/2016   LDLCALC 104 (H) 12/26/2016   LDLDIRECT 109.0 10/25/2014   TRIG 94.0 12/26/2016   CHOLHDL 3 12/26/2016   Lab Results  Component Value Date   ALT 14 12/26/2016   AST 19 12/26/2016   ALKPHOS 69 12/26/2016   BILITOT 0.9 12/26/2016

## 2017-04-09 NOTE — Assessment & Plan Note (Signed)
Well controlled on current regimen. Renal function stable.  No changes today   Lab Results  Component Value Date   CREATININE 0.80 12/26/2016   Lab Results  Component Value Date   NA 137 12/26/2016   K 4.7 12/26/2016   CL 102 12/26/2016   CO2 28 12/26/2016

## 2017-04-10 ENCOUNTER — Encounter: Payer: Self-pay | Admitting: *Deleted

## 2017-05-21 ENCOUNTER — Telehealth: Payer: Self-pay | Admitting: Internal Medicine

## 2017-05-21 DIAGNOSIS — M810 Age-related osteoporosis without current pathological fracture: Secondary | ICD-10-CM

## 2017-05-21 NOTE — Telephone Encounter (Signed)
Spoke with pt let her know that the dexa scan has been ordered and she can call and schedule it now. Pt gave a verbal understanding.

## 2017-05-21 NOTE — Telephone Encounter (Signed)
Please advise 

## 2017-05-21 NOTE — Telephone Encounter (Signed)
Copied from [place] [id number]. Topic: Inquiry >> May 21, 2017  8:27 AM Margot Ables wrote: Reason for CRM: pt called requesting bone density scan. She said it was discussed during last OV. She is close to [facility] so it can be done there. Please notify pt when order has been entered.

## 2017-06-04 ENCOUNTER — Other Ambulatory Visit: Payer: Self-pay | Admitting: Internal Medicine

## 2017-07-06 ENCOUNTER — Ambulatory Visit: Admission: RE | Admit: 2017-07-06 | Payer: BLUE CROSS/BLUE SHIELD | Source: Ambulatory Visit

## 2017-07-10 DIAGNOSIS — D2261 Melanocytic nevi of right upper limb, including shoulder: Secondary | ICD-10-CM | POA: Diagnosis not present

## 2017-07-10 DIAGNOSIS — D2272 Melanocytic nevi of left lower limb, including hip: Secondary | ICD-10-CM | POA: Diagnosis not present

## 2017-07-10 DIAGNOSIS — D2262 Melanocytic nevi of left upper limb, including shoulder: Secondary | ICD-10-CM | POA: Diagnosis not present

## 2017-07-10 DIAGNOSIS — Z85828 Personal history of other malignant neoplasm of skin: Secondary | ICD-10-CM | POA: Diagnosis not present

## 2017-08-01 ENCOUNTER — Other Ambulatory Visit: Payer: Self-pay | Admitting: Internal Medicine

## 2017-10-16 ENCOUNTER — Telehealth: Payer: Self-pay | Admitting: Radiology

## 2017-10-16 DIAGNOSIS — E782 Mixed hyperlipidemia: Secondary | ICD-10-CM

## 2017-10-16 DIAGNOSIS — Z79899 Other long term (current) drug therapy: Secondary | ICD-10-CM

## 2017-10-16 NOTE — Addendum Note (Signed)
Addended by: Crecencio Mc on: 10/16/2017 12:09 PM   Modules accepted: Orders

## 2017-10-16 NOTE — Telephone Encounter (Signed)
Pt coming in for labs Monday, please place future orders. Thank you 

## 2017-10-16 NOTE — Telephone Encounter (Signed)
Labs ordered.  No Vitamin D ,, if she asks.Marland KitchenMarland KitchenMarland Kitchen

## 2017-10-19 ENCOUNTER — Other Ambulatory Visit (INDEPENDENT_AMBULATORY_CARE_PROVIDER_SITE_OTHER): Payer: BLUE CROSS/BLUE SHIELD

## 2017-10-19 DIAGNOSIS — Z79899 Other long term (current) drug therapy: Secondary | ICD-10-CM

## 2017-10-19 DIAGNOSIS — E782 Mixed hyperlipidemia: Secondary | ICD-10-CM | POA: Diagnosis not present

## 2017-10-19 LAB — COMPREHENSIVE METABOLIC PANEL
ALT: 13 U/L (ref 0–35)
AST: 16 U/L (ref 0–37)
Albumin: 4 g/dL (ref 3.5–5.2)
Alkaline Phosphatase: 70 U/L (ref 39–117)
BUN: 14 mg/dL (ref 6–23)
CO2: 26 mEq/L (ref 19–32)
Calcium: 9.3 mg/dL (ref 8.4–10.5)
Chloride: 104 mEq/L (ref 96–112)
Creatinine, Ser: 0.76 mg/dL (ref 0.40–1.20)
GFR: 81.43 mL/min (ref >60.00)
Glucose, Bld: 93 mg/dL (ref 70–99)
Potassium: 3.9 mEq/L (ref 3.5–5.1)
Sodium: 139 mEq/L (ref 135–145)
Total Bilirubin: 0.8 mg/dL (ref 0.2–1.2)
Total Protein: 6.7 g/dL (ref 6.0–8.3)

## 2017-10-19 LAB — LIPID PANEL
Cholesterol: 173 mg/dL (ref 0–200)
HDL: 55 mg/dL (ref >39.00)
LDL Cholesterol: 106 mg/dL — ABNORMAL HIGH (ref 0–99)
NonHDL: 117.62
Total CHOL/HDL Ratio: 3
Triglycerides: 60 mg/dL (ref 0.0–149.0)
VLDL: 12 mg/dL (ref 0.0–40.0)

## 2017-10-23 ENCOUNTER — Ambulatory Visit: Payer: BLUE CROSS/BLUE SHIELD | Admitting: Internal Medicine

## 2017-10-23 ENCOUNTER — Encounter: Payer: Self-pay | Admitting: Internal Medicine

## 2017-10-23 VITALS — BP 108/68 | HR 78 | Temp 97.8°F | Resp 15 | Ht 65.0 in | Wt 148.2 lb

## 2017-10-23 DIAGNOSIS — E782 Mixed hyperlipidemia: Secondary | ICD-10-CM

## 2017-10-23 DIAGNOSIS — Z1239 Encounter for other screening for malignant neoplasm of breast: Secondary | ICD-10-CM | POA: Diagnosis not present

## 2017-10-23 DIAGNOSIS — I10 Essential (primary) hypertension: Secondary | ICD-10-CM | POA: Diagnosis not present

## 2017-10-23 DIAGNOSIS — F5104 Psychophysiologic insomnia: Secondary | ICD-10-CM | POA: Diagnosis not present

## 2017-10-23 DIAGNOSIS — Z7184 Encounter for health counseling related to travel: Secondary | ICD-10-CM

## 2017-10-23 MED ORDER — LEVOFLOXACIN 500 MG PO TABS: 500.0000 mg | ORAL_TABLET | Freq: Every day | ORAL | 0 refills | Status: DC

## 2017-10-23 MED ORDER — SIMVASTATIN 20 MG PO TABS: 20.0000 mg | ORAL_TABLET | ORAL | 1 refills | Status: DC

## 2017-10-23 MED ORDER — LOSARTAN POTASSIUM 100 MG PO TABS: 100.0000 mg | ORAL_TABLET | Freq: Every day | ORAL | 1 refills | Status: DC

## 2017-10-23 MED ORDER — OSELTAMIVIR PHOSPHATE 75 MG PO CAPS: 75.0000 mg | ORAL_CAPSULE | Freq: Every day | ORAL | 0 refills | Status: DC

## 2017-10-23 MED ORDER — PREDNISONE 10 MG PO TABS: ORAL_TABLET | ORAL | 0 refills | Status: DC

## 2017-10-23 MED ORDER — ZOLPIDEM TARTRATE 5 MG PO TABS: 5.0000 mg | ORAL_TABLET | Freq: Every evening | ORAL | 1 refills | Status: DC | PRN

## 2017-10-23 NOTE — Patient Instructions (Addendum)
headspace app for meditation exercises.  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  Do it over the sink because it can be a bit messy  I am sending you off with a travel pack of Levaquin,  Prednisone taper,  And Tami flu  (for use in case you are exposed or having sympotms of flu)  Start the tamiflu if you have  A  flu exposure (once daily for 2 days)  . I ncrease dose to twice daily if you develop flu symptoms and call for refill     Your annual mammogram has been ordered.  You are encouraged (required) to call to make your appointment at [place] [phone]

## 2017-10-23 NOTE — Progress Notes (Signed)
Subjective:  Patient ID: Amber Sullivan, female    DOB: [birth year]  Age: 62 y.o. MRN: [id number]  CC: The primary encounter diagnosis was Breast cancer screening. Diagnoses of Chronic insomnia, Essential hypertension, Mixed hyperlipidemia, and Travel advice encounter were also pertinent to this visit.  HPI Amber Sullivan presents for  6 month follow up on hypertension and hyperlipidmeia. Patient is taking her medications as prescribed and notes no adverse effects.  Home BP readings have been done about once per week and are  generally < 130/80 .  She is avoiding added salt in her diet and walking regularly about 3 times per week for exercise    Increased anxiety over work stressors. Discussed work environment in detail, current management strategies and ways to manage symptoms without additional medication.    Outpatient Medications Prior to Visit  Medication Sig Dispense Refill  . benzonatate (TESSALON) 200 MG capsule Take 1 capsule (200 mg total) by mouth 3 (three) times daily as needed for cough. 60 capsule 1  . Calcium Carbonate-Vit D-Min (CALTRATE 600+D PLUS MINERALS) 600-800 MG-UNIT TABS Take 1 tablet by mouth daily. Taking 1 tablet every other day    . Diclofenac Sodium (PENNSAID) 2 % SOLN Place 2 application onto the skin 2 (two) times daily as needed. 112 g 6  . diphenhydrAMINE (BENADRYL) 25 MG tablet Take 25 mg by mouth daily.    Marland Kitchen estradiol (ESTRACE VAGINAL) 0.1 MG/GM vaginal cream Place 1 Applicatorful vaginally 3 (three) times a week. 42.5 g 2  . losartan (COZAAR) 100 MG tablet TAKE 1 TABLET DAILY 90 tablet 1  . simvastatin (ZOCOR) 20 MG tablet Take 1 tablet (20 mg total) by mouth every other day. 90 tablet 1  . zolpidem (AMBIEN) 5 MG tablet Take 1 tablet (5 mg total) by mouth at bedtime as needed for sleep. 90 tablet 1   No facility-administered medications prior to visit.     Review of Systems;  Patient denies headache, fevers, malaise, unintentional weight loss,  skin rash, eye pain, sinus congestion and sinus pain, sore throat, dysphagia,  hemoptysis , cough, dyspnea, wheezing, chest pain, palpitations, orthopnea, edema, abdominal pain, nausea, melena, diarrhea, constipation, flank pain, dysuria, hematuria, urinary  Frequency, nocturia, numbness, tingling, seizures,  Focal weakness, Loss of consciousness,  Tremor, insomnia, depression, anxiety, and suicidal ideation.      Objective:  BP 108/68 (BP Location: Left Arm, Patient Position: Sitting, Cuff Size: Normal)   Pulse 78   Temp 97.8 F (36.6 C) (Oral)   Resp 15   Ht 5\' 5"  (1.651 m)   Wt 148 lb 3.2 oz (67.2 kg)   SpO2 98%   BMI 24.66 kg/m   BP Readings from Last 3 Encounters:  10/23/17 108/68  04/08/17 120/74  01/19/17 128/78    Wt Readings from Last 3 Encounters:  10/23/17 148 lb 3.2 oz (67.2 kg)  04/08/17 151 lb 6.4 oz (68.7 kg)  01/19/17 148 lb 12.8 oz (67.5 kg)    General appearance: alert, cooperative and appears stated age Ears: normal TM's and external ear canals both ears Throat: lips, mucosa, and tongue normal; teeth and gums normal Neck: no adenopathy, no carotid bruit, supple, symmetrical, trachea midline and thyroid not enlarged, symmetric, no tenderness/mass/nodules Back: symmetric, no curvature. ROM normal. No CVA tenderness. Lungs: clear to auscultation bilaterally Heart: regular rate and rhythm, S1, S2 normal, no murmur, click, rub or gallop Abdomen: soft, non-tender; bowel sounds normal; no masses,  no organomegaly Pulses: 2+ and symmetric Skin:  Skin color, texture, turgor normal. No rashes or lesions Lymph nodes: Cervical, supraclavicular, and axillary nodes normal.  No results found for: HGBA1C  Lab Results  Component Value Date   CREATININE 0.76 10/19/2017   CREATININE 0.80 12/26/2016   CREATININE 0.81 04/21/2016    Lab Results  Component Value Date   WBC 6.3 11/19/2015   HGB 13.6 11/19/2015   HCT 40.7 11/19/2015   PLT 208.0 11/19/2015   GLUCOSE 93  10/19/2017   CHOL 173 10/19/2017   TRIG 60.0 10/19/2017   HDL 55.00 10/19/2017   LDLDIRECT 109.0 10/25/2014   LDLCALC 106 (H) 10/19/2017   ALT 13 10/19/2017   AST 16 10/19/2017   NA 139 10/19/2017   K 3.9 10/19/2017   CL 104 10/19/2017   CREATININE 0.76 10/19/2017   BUN 14 10/19/2017   CO2 26 10/19/2017   TSH 2.07 11/19/2015   MICROALBUR 0.5 08/30/2012    No results found.  Assessment & Plan:   Problem List Items Addressed This Visit    Chronic insomnia    Managed with prn use of ambien.. Refills given      Hyperlipidemia    LDL and triglycerides are at goal on current medications. She has no side effects and liver enzymes are normal. No changes today  Lab Results  Component Value Date   CHOL 173 10/19/2017   HDL 55.00 10/19/2017   LDLCALC 106 (H) 10/19/2017   LDLDIRECT 109.0 10/25/2014   TRIG 60.0 10/19/2017   CHOLHDL 3 10/19/2017   Lab Results  Component Value Date   ALT 13 10/19/2017   AST 16 10/19/2017   ALKPHOS 70 10/19/2017   BILITOT 0.8 10/19/2017         Relevant Medications   simvastatin (ZOCOR) 20 MG tablet   losartan (COZAAR) 100 MG tablet   Hypertension    Well controlled on current regimen. Renal function stable, no changes today.  Lab Results  Component Value Date   CREATININE 0.76 10/19/2017   Lab Results  Component Value Date   NA 139 10/19/2017   K 3.9 10/19/2017   CL 104 10/19/2017   CO2 26 10/19/2017         Relevant Medications   simvastatin (ZOCOR) 20 MG tablet   losartan (COZAAR) 100 MG tablet   Travel advice encounter    Patient given medications to take with her on upcoming trip including tamiflu and levaquin        Other Visit Diagnoses    Breast cancer screening    -  Primary   Relevant Orders   MM DIGITAL SCREENING BILATERAL      I have changed Izora Gala L. Dunagan's losartan. I am also having her start on predniSONE and oseltamivir. Additionally, I am having her maintain her CALTRATE 600+D PLUS MINERALS,  diphenhydrAMINE, benzonatate, Diclofenac Sodium, estradiol, levofloxacin, simvastatin, and zolpidem.  Meds ordered this encounter  Medications  . predniSONE (DELTASONE) 10 MG tablet    Sig: 6 tablets on Day 1 , then reduce by 1 tablet daily until gone    Dispense:  21 tablet    Refill:  0  . oseltamivir (TAMIFLU) 75 MG capsule    Sig: Take 1 capsule (75 mg total) by mouth daily.    Dispense:  10 capsule    Refill:  0  . levofloxacin (LEVAQUIN) 500 MG tablet    Sig: Take 1 tablet (500 mg total) by mouth daily.    Dispense:  7 tablet    Refill:  0  .  simvastatin (ZOCOR) 20 MG tablet    Sig: Take 1 tablet (20 mg total) by mouth every other day.    Dispense:  90 tablet    Refill:  1  . losartan (COZAAR) 100 MG tablet    Sig: Take 1 tablet (100 mg total) by mouth daily.    Dispense:  90 tablet    Refill:  1  . zolpidem (AMBIEN) 5 MG tablet    Sig: Take 1 tablet (5 mg total) by mouth at bedtime as needed for sleep.    Dispense:  90 tablet    Refill:  1    Medications Discontinued During This Encounter  Medication Reason  . simvastatin (ZOCOR) 20 MG tablet Reorder  . losartan (COZAAR) 100 MG tablet Reorder  . zolpidem (AMBIEN) 5 MG tablet Reorder    Follow-up: Return in about 6 months (around 04/24/2018) for CPE.   Crecencio Mc, MD

## 2017-10-25 DIAGNOSIS — Z7184 Encounter for health counseling related to travel: Secondary | ICD-10-CM | POA: Insufficient documentation

## 2017-10-25 NOTE — Assessment & Plan Note (Signed)
LDL and triglycerides are at goal on current medications. She has no side effects and liver enzymes are normal. No changes today  Lab Results  Component Value Date   CHOL 173 10/19/2017   HDL 55.00 10/19/2017   LDLCALC 106 (H) 10/19/2017   LDLDIRECT 109.0 10/25/2014   TRIG 60.0 10/19/2017   CHOLHDL 3 10/19/2017   Lab Results  Component Value Date   ALT 13 10/19/2017   AST 16 10/19/2017   ALKPHOS 70 10/19/2017   BILITOT 0.8 10/19/2017

## 2017-10-25 NOTE — Assessment & Plan Note (Signed)
Patient given medications to take with her on upcoming trip including tamiflu and levaquin

## 2017-10-25 NOTE — Assessment & Plan Note (Signed)
Managed with prn use of ambien.. Refills given 

## 2017-10-25 NOTE — Assessment & Plan Note (Signed)
Well controlled on current regimen. Renal function stable, no changes today.  Lab Results  Component Value Date   CREATININE 0.76 10/19/2017   Lab Results  Component Value Date   NA 139 10/19/2017   K 3.9 10/19/2017   CL 104 10/19/2017   CO2 26 10/19/2017

## 2017-10-28 ENCOUNTER — Telehealth: Payer: Self-pay | Admitting: Internal Medicine

## 2017-10-28 NOTE — Telephone Encounter (Signed)
Copied from [place] [id number]. Topic: General - Other >> Oct 28, 2017  4:01 PM Sheran Luz wrote: Reason for CRM: Pt would like it noted in chart that until she says otherwise, these three [facility].  zolpidem (AMBIEN) 5 MG tablet [[id number]] simvastatin (ZOCOR) 20 MG tablet [[id number]] losartan (COZAAR) 100 MG tablet [[id number]]

## 2017-10-29 MED ORDER — LOSARTAN POTASSIUM 100 MG PO TABS: 100.0000 mg | ORAL_TABLET | Freq: Every day | ORAL | 1 refills | Status: DC

## 2017-10-29 MED ORDER — SIMVASTATIN 20 MG PO TABS: 20.0000 mg | ORAL_TABLET | ORAL | 1 refills | Status: DC

## 2017-10-29 NOTE — Telephone Encounter (Signed)
No I am no longer comfortable refilling  any controlled substances in quantities over a 30 day supply .

## 2017-10-29 NOTE — Telephone Encounter (Signed)
Pt is stating that the Amb[facility] has been [facility] in the past. Pt is wanting to know if you would consider doing this again.

## 2017-10-29 NOTE — Telephone Encounter (Signed)
Pt states Dr Derrel Nip has [facility] in [facility]. Also [facility] has a different manufacturer and she can break the ones from [facility] in half which makes it nice. Pt thinks [facility] only gave her 30 tabs, but she is not at home and will check when she gets back in town next week.  But she hopes Dr Derrel Nip will send the Lorrin Mais to [facility] as she has done in the past.

## 2017-10-29 NOTE — Telephone Encounter (Signed)
LMTCB. Need to let pt know that the losartan and the simvastatin has ben sent to [facility] but the Ambien will not be sent to [facility] because it is a controlled substance and Dr. Derrel Nip does not send controlled substances to mail order pharmacies. [facility] may speak with pt.

## 2017-11-03 NOTE — Telephone Encounter (Signed)
LMTCB. P[facility] may speak with pt.  

## 2017-11-04 NOTE — Telephone Encounter (Signed)
Pt called back in. Made pt aware of PCP response.

## 2018-02-15 ENCOUNTER — Ambulatory Visit (INDEPENDENT_AMBULATORY_CARE_PROVIDER_SITE_OTHER): Payer: BLUE CROSS/BLUE SHIELD

## 2018-02-15 ENCOUNTER — Other Ambulatory Visit: Payer: Self-pay

## 2018-02-15 ENCOUNTER — Encounter: Payer: Self-pay | Admitting: Family Medicine

## 2018-02-15 ENCOUNTER — Ambulatory Visit: Payer: BLUE CROSS/BLUE SHIELD | Admitting: Family Medicine

## 2018-02-15 VITALS — BP 112/60 | HR 82 | Temp 98.2°F | Resp 16 | Ht 65.0 in | Wt 150.0 lb

## 2018-02-15 DIAGNOSIS — R05 Cough: Secondary | ICD-10-CM | POA: Diagnosis not present

## 2018-02-15 DIAGNOSIS — R059 Cough, unspecified: Secondary | ICD-10-CM

## 2018-02-15 DIAGNOSIS — J449 Chronic obstructive pulmonary disease, unspecified: Secondary | ICD-10-CM | POA: Diagnosis not present

## 2018-02-15 MED ORDER — BENZONATATE 200 MG PO CAPS: 200.0000 mg | ORAL_CAPSULE | Freq: Three times a day (TID) | ORAL | 1 refills | Status: DC | PRN

## 2018-02-15 MED ORDER — ALBUTEROL SULFATE HFA 108 (90 BASE) MCG/ACT IN AERS: INHALATION_SPRAY | RESPIRATORY_TRACT | 0 refills | Status: DC

## 2018-02-15 NOTE — Progress Notes (Signed)
Subjective:    Patient ID: Amber Sullivan, female    DOB: [birth year], 63 y.o.   MRN: [id number]  HPI   Patient presents to clinic complaining of a dry cough that comes and goes since January 13, 2018.  Patient states in the past she is used Best boy to help, cough with good success.  Currently has not used any Tessalon Perles due to being unsure what cough could be from.  Patient states she did go on a vacation to [place] from January 4 to January 30, 2018.  I checked the CDC website, there are no travel alerts for [place], just recommended general travel precautions.  Patient was given Tamiflu and Levaquin prior to her trip to use if needed, but patient did not take either of these medications.  Patient states she has had times in the past where she will have an annoying cough that will not go away that oftentimes begins as a common cold.  Denies any fever or chills.  Denies chest pain.  Denies nausea/vomiting or diarrhea.  Patient Active Problem List   Diagnosis Date Noted  . Travel advice encounter 10/25/2017  . Positive colorectal cancer screening using DNA-based stool test 04/28/2016  . 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 BPPV (benign paroxysmal positional vertigo) 11/13/2014  . Chronic insomnia 10/27/2014  . Osteoporosis 10/27/2014  . History of abnormal cervical Pap smear 03/30/2014  . Long-term use of high-risk medication 01/30/2014  . Subacromial bursitis 09/07/2013  . Rotator cuff (capsule) sprain 09/02/2013  . Plantar fasciitis of right foot 05/20/2013  . Tendonitis, Achilles, right 05/09/2013  . Encounter for preventive health examination 10/13/2012  . History of Mohs surgery for squamous cell carcinoma in situ of skin 01/28/2012  . Screening for colon cancer 01/28/2012  . Hyperlipidemia   . Hypertension    Social History   Tobacco Use  . Smoking status: Never Smoker  . Smokeless tobacco: Never Used  Substance Use Topics  . Alcohol use: Yes    Comment: not much    Review of Systems  Constitutional: Negative for chills, fatigue and fever.  HENT: Negative for congestion, ear pain, sinus pain and sore throat.   Eyes: Negative.   Respiratory: +persistant cough. Sometimes wheezy at night when laying, but during day no SOB or wheezing. Cardiovascular: Negative for chest pain, palpitations and leg swelling.  Gastrointestinal: Negative for abdominal pain, diarrhea, nausea and vomiting.  Genitourinary: Negative for dysuria, frequency and urgency.  Musculoskeletal: Negative for arthralgias and myalgias.  Skin: Negative for color change, pallor and rash.  Neurological: Negative for syncope, light-headedness and headaches.  Psychiatric/Behavioral: The patient is not nervous/anxious.       Objective:   Physical Exam  Constitutional: Appears in no distress.  Afebrile. Eyes: Pupils are equal, round, and reactive to light. EOM are normal. No scleral icterus.  Neck: Normal range of motion. Neck supple. No tracheal deviation present.  Cardiovascular: Normal rate, regular rhythm and normal heart sounds.  Pulmonary/Chest: Effort normal and breath sounds normal. No respiratory distress. She has no wheezes. She has no rales.  Neurological: She is alert and oriented to person, place, and time.  Gait normal  Skin: Skin is warm and dry. No pallor.  Psychiatric: She has a normal mood and affect. Her behavior is normal. Thought content normal.   Nursing note and vitals reviewed.  Vitals:   02/15/18 0816  BP: 112/60  Pulse: 82  Resp: 16  Temp: 98.2 F (36.8 C)  SpO2: 98%  Assessment & Plan:    Cough in adult - due to length of time with cough we will get chest x-ray to rule out any underlying pneumonia or abnormality in the lungs.  Patient will use Tessalon Perles to suppress cough, has had success with these in the past.  She will also use albuterol inhaler 2 puffs 2 times a day and every 6 hours as needed for for the first 5 days, after 5 days she  will just use albuterol if needed.  If cough continues to persist after this treatment plan, we can consider adding a different type of inhaler and or pulmonology referral.  Patient will keep regularly scheduled follow-up with PCP as planned.  Advised return to clinic sooner if any issues arise.

## 2018-04-16 ENCOUNTER — Telehealth: Payer: Self-pay

## 2018-04-16 NOTE — Telephone Encounter (Signed)
Copied from [place] [id number]. Topic: Appointment Scheduling - Scheduling Inquiry for Clinic >> Apr 16, 2018 11:46 AM Rutherford Nail, NT wrote: Reason for CRM: Patient calling and states that she received the automated appointment reminder. Would like to know if Dr Deborra Medina would still like for her to come into the office for labs? States that she would be willing to do a WebEx or telephone visit for her CPE on 04/28/2018.

## 2018-04-16 NOTE — Telephone Encounter (Signed)
Spoke with pt to let her know that we do not do physical via webex but that we could change her appt to a follow up and push her physical out some. The pt stated that she would like to do that. Pt is scheduled for a virtual appt on 04/28/2018. Physical and lab appt have both been rescheduled. Pt is aware of appts dates and times.

## 2018-04-16 NOTE — Telephone Encounter (Signed)
LMTCB. Please transfer pt to our office.  

## 2018-04-16 NOTE — Telephone Encounter (Signed)
Pt returned Jessica's call.  Pt is ok with WebEx visit and wants to know if she should still come in for labs.  Pt gave verbal permission to leave a detailed message on her cell phone # [phone] just in case she misses call again.  Permission to l/m is also on DPR in chart.

## 2018-04-21 ENCOUNTER — Other Ambulatory Visit: Payer: BLUE CROSS/BLUE SHIELD

## 2018-04-28 ENCOUNTER — Encounter: Payer: BLUE CROSS/BLUE SHIELD | Admitting: Internal Medicine

## 2018-04-28 ENCOUNTER — Ambulatory Visit (INDEPENDENT_AMBULATORY_CARE_PROVIDER_SITE_OTHER): Payer: BLUE CROSS/BLUE SHIELD | Admitting: Internal Medicine

## 2018-04-28 DIAGNOSIS — I10 Essential (primary) hypertension: Secondary | ICD-10-CM | POA: Diagnosis not present

## 2018-04-28 DIAGNOSIS — S8261XF Displaced fracture of lateral malleolus of right fibula, subsequent encounter for open fracture type IIIA, IIIB, or IIIC with routine healing: Secondary | ICD-10-CM | POA: Diagnosis not present

## 2018-04-28 DIAGNOSIS — F5104 Psychophysiologic insomnia: Secondary | ICD-10-CM | POA: Diagnosis not present

## 2018-04-28 DIAGNOSIS — M25541 Pain in joints of right hand: Secondary | ICD-10-CM

## 2018-04-28 DIAGNOSIS — Z1239 Encounter for other screening for malignant neoplasm of breast: Secondary | ICD-10-CM

## 2018-04-28 DIAGNOSIS — Z79899 Other long term (current) drug therapy: Secondary | ICD-10-CM

## 2018-04-28 DIAGNOSIS — M810 Age-related osteoporosis without current pathological fracture: Secondary | ICD-10-CM

## 2018-04-28 MED ORDER — LOSARTAN POTASSIUM 100 MG PO TABS: 100.0000 mg | ORAL_TABLET | Freq: Every day | ORAL | 1 refills | Status: DC

## 2018-04-28 MED ORDER — BENZONATATE 200 MG PO CAPS: 200.0000 mg | ORAL_CAPSULE | Freq: Three times a day (TID) | ORAL | 1 refills | Status: DC | PRN

## 2018-04-28 MED ORDER — SIMVASTATIN 20 MG PO TABS: 20.0000 mg | ORAL_TABLET | ORAL | 1 refills | Status: DC

## 2018-04-28 MED ORDER — ZOLPIDEM TARTRATE 5 MG PO TABS: 5.0000 mg | ORAL_TABLET | Freq: Every evening | ORAL | 1 refills | Status: DC | PRN

## 2018-04-28 NOTE — Progress Notes (Signed)
Virtual Visit via Video  Note  This visit type was conducted due to national recommendations for restrictions regarding the COVID-19 pandemic (e.g. social distancing).  This format is felt to be most appropriate for this patient at this time.  All issues noted in this document were discussed and addressed.  No physical exam was performed (except for noted visual exam findings with Video Visits).   I connected with@ on 04/29/18 at  8:00 AM EDT by a video enabled telemedicine application or telephone and verified that I am speaking with the correct person using two identifiers. Location patient: home Location provider: work or home office Persons participating in the virtual visit: patient, provider  I discussed the limitations, risks, security and privacy concerns of performing an evaluation and management service by telephone and the availability of in person appointments. I also discussed with the patient that there may be a patient responsible charge related to this service. The patient expressed understanding and agreed to proceed. Reason for visit:  follow up  HPI:  63 yr old female with history of hypertension , chronic insomnia presents for 6 month follow up on chronic issues including hypertension, hyperlipidemia   Patient is taking her medications as prescribed and notes no adverse effects.  Home BP readings have been done about once or twice per week and are  generally < 130/80 .  She is avoiding added salt in her diet and walking daily  For 30 minutes for exercise.  She has been working from home for 4 weeks for [employer].  No recent travel or sick contacts. However husband returned in late February from vacation to [place] and self quarantined for 14 days.  She has chronic joint pain affecting her hands. ut continues to play the piano daily without trouble   Osteoporosis:  She continues to walk daily and use light weights for exercise.  Taking calcium and vitamin D.  No pharmacologics  desired.    ROS: See pertinent positives and negatives per HPI.  Past Medical History:  Diagnosis Date  . Fracture of lateral malleolus 2009  . Hyperlipidemia   . Hypertension 2003  . Positive colorectal cancer screening using Cologuard test 04/20/2016    Past Surgical History:  Procedure Laterality Date  . CHOLECYSTECTOMY  2005  . COLONOSCOPY WITH PROPOFOL N/A 08/13/2016   Procedure: COLONOSCOPY WITH PROPOFOL;  Surgeon: Robert Bellow, MD;  Location: [facility] ENDOSCOPY;  Service: Endoscopy;  Laterality: N/A;  . MOHS SURGERY  2007   squamou cell carcinoma rt nasolabial fold  . SQUAMOUS CELL CARCINOMA EXCISION  2007   mole on face    Family History  Problem Relation Age of Onset  . Hypertension Mother   . Diabetes Mother        type 2  . Stroke Mother 25       massive, with aphasia and paraplegia  . Hypertension Father   . Hyperlipidemia Father   . Heart attack Father        vs PE during hospitalization for chest pain   . Heart disease Father   . Heart attack Maternal Grandfather   . Heart disease Paternal Grandmother        CHF  . Heart disease Paternal Grandfather   . Heart attack Paternal Grandfather     SOCIAL HX: married, Scientist, physiological for [employer]    Current Outpatient Medications:  .  albuterol (PROVENTIL HFA;VENTOLIN HFA) 108 (90 Base) MCG/ACT inhaler, Take 2 puffs 2 times per day for 5 days, and may  use every 6 hours as needed. After 5 days, use as needed, Disp: 1 Inhaler, Rfl: 0 .  Calcium Carbonate-Vit D-Min (CALTRATE 600+D PLUS MINERALS) 600-800 MG-UNIT TABS, Take 1 tablet by mouth daily. Taking 1 tablet every other day, Disp: , Rfl:  .  diphenhydrAMINE (BENADRYL) 25 MG tablet, Take 25 mg by mouth daily., Disp: , Rfl:  .  losartan (COZAAR) 100 MG tablet, Take 1 tablet (100 mg total) by mouth daily., Disp: 90 tablet, Rfl: 1 .  simvastatin (ZOCOR) 20 MG tablet, Take 1 tablet (20 mg total) by mouth every other day., Disp: 90 tablet, Rfl: 1 .  zolpidem (AMBIEN) 5  MG tablet, Take 1 tablet (5 mg total) by mouth at bedtime as needed for sleep., Disp: 90 tablet, Rfl: 1 .  benzonatate (TESSALON) 200 MG capsule, Take 1 capsule (200 mg total) by mouth 3 (three) times daily as needed for cough. (Patient not taking: Reported on 04/28/2018), Disp: 60 capsule, Rfl: 1 .  benzonatate (TESSALON) 200 MG capsule, Take 1 capsule (200 mg total) by mouth 3 (three) times daily as needed for cough., Disp: 60 capsule, Rfl: 1 .  Diclofenac Sodium (PENNSAID) 2 % SOLN, Place 2 application onto the skin 2 (two) times daily as needed. (Patient not taking: Reported on 04/28/2018), Disp: 112 g, Rfl: 6 .  estradiol (ESTRACE VAGINAL) 0.1 MG/GM vaginal cream, Place 1 Applicatorful vaginally 3 (three) times a week. (Patient not taking: Reported on 04/28/2018), Disp: 42.5 g, Rfl: 2  EXAM:  VITALS per patient if applicable:  GENERAL: alert, oriented, appears well and in no acute distress  HEENT: atraumatic, conjunttiva clear, no obvious abnormalities on inspection of external nose and ears  NECK: normal movements of the head and neck  LUNGS: on inspection no signs of respiratory distress, breathing rate appears normal, no obvious gross SOB, gasping or wheezing  CV: no obvious cyanosis  MS: moves all visible extremities without noticeable abnormality  PSYCH/NEURO: pleasant and cooperative, no obvious depression or anxiety, speech and thought processing grossly intact  ASSESSMENT AND PLAN:  Discussed the following assessment and plan:  Chronic insomnia Managed with prn use of ambien.. Refills given  Long-term use of high-risk medication She takes a statin daily . she has no side effects and liver enzymes  Have been normal ,  She is due for check   Lab Results  Component Value Date   CHOL 173 10/19/2017   HDL 55.00 10/19/2017   LDLCALC 106 (H) 10/19/2017   LDLDIRECT 109.0 10/25/2014   TRIG 60.0 10/19/2017   CHOLHDL 3 10/19/2017   Lab Results  Component Value Date   ALT 13  10/19/2017   AST 16 10/19/2017   ALKPHOS 70 10/19/2017   BILITOT 0.8 10/19/2017     Hypertension Well controlled (by home readings done twice weekly ) on losartan taken at night,  No side effects.    no changes today.  Lab Results  Component Value Date   CREATININE 0.76 10/19/2017   Lab Results  Component Value Date   NA 139 10/19/2017   K 3.9 10/19/2017   CL 104 10/19/2017   CO2 26 10/19/2017     Osteoporosis By June 2016 DEXA scan.  Risks and benefits of pharmacologic therapy have been discussed with patient and she has deferred therapy  Reviewed  the current controversies surrounding the risks and benefits of calcium supplementation.  Encouraged her to increase dietary calcium through natural foods including almond/coconut milk .   Pain in thumb joint with  movement of right hand She is requesting a steroid injection in the right thum and will be referred to  [place] for breast cancer Refer to [facility] for 3D mammogram   Updated Medication List Outpatient Encounter Medications as of 04/28/2018  Medication Sig  . albuterol (PROVENTIL HFA;VENTOLIN HFA) 108 (90 Base) MCG/ACT inhaler Take 2 puffs 2 times per day for 5 days, and may use every 6 hours as needed. After 5 days, use as needed  . Calcium Carbonate-Vit D-Min (CALTRATE 600+D PLUS MINERALS) 600-800 MG-UNIT TABS Take 1 tablet by mouth daily. Taking 1 tablet every other day  . diphenhydrAMINE (BENADRYL) 25 MG tablet Take 25 mg by mouth daily.  Marland Kitchen losartan (COZAAR) 100 MG tablet Take 1 tablet (100 mg total) by mouth daily.  . simvastatin (ZOCOR) 20 MG tablet Take 1 tablet (20 mg total) by mouth every other day.  . zolpidem (AMBIEN) 5 MG tablet Take 1 tablet (5 mg total) by mouth at bedtime as needed for sleep.  . [DISCONTINUED] losartan (COZAAR) 100 MG tablet Take 1 tablet (100 mg total) by mouth daily.  . [DISCONTINUED] simvastatin (ZOCOR) 20 MG tablet Take 1 tablet (20 mg total) by mouth every other  day.  . [DISCONTINUED] zolpidem (AMBIEN) 5 MG tablet Take 1 tablet (5 mg total) by mouth at bedtime as needed for sleep.  . benzonatate (TESSALON) 200 MG capsule Take 1 capsule (200 mg total) by mouth 3 (three) times daily as needed for cough. (Patient not taking: Reported on 04/28/2018)  . benzonatate (TESSALON) 200 MG capsule Take 1 capsule (200 mg total) by mouth 3 (three) times daily as needed for cough.  . Diclofenac Sodium (PENNSAID) 2 % SOLN Place 2 application onto the skin 2 (two) times daily as needed. (Patient not taking: Reported on 04/28/2018)  . estradiol (ESTRACE VAGINAL) 0.1 MG/GM vaginal cream Place 1 Applicatorful vaginally 3 (three) times a week. (Patient not taking: Reported on 04/28/2018)  . [DISCONTINUED] levofloxacin (LEVAQUIN) 500 MG tablet Take 1 tablet (500 mg total) by mouth daily. (Patient not taking: Reported on 04/28/2018)  . [DISCONTINUED] oseltamivir (TAMIFLU) 75 MG capsule Take 1 capsule (75 mg total) by mouth daily. (Patient not taking: Reported on 04/28/2018)  . [DISCONTINUED] predniSONE (DELTASONE) 10 MG tablet 6 tablets on Day 1 , then reduce by 1 tablet daily until gone (Patient not taking: Reported on 04/28/2018)   No facility-administered encounter medications on file as of 04/28/2018.      I discussed the assessment and treatment plan with the patient. The patient was provided an opportunity to ask questions and all were answered. The patient agreed with the plan and demonstrated an understanding of the instructions.   The patient was advised to call back or seek an in-person evaluation if the symptoms worsen or if the condition fails to improve as anticipated.  I provided  25 minutes of non-face-to-face time during this encounter.   Crecencio Mc, MD

## 2018-04-29 ENCOUNTER — Encounter: Payer: Self-pay | Admitting: Internal Medicine

## 2018-04-29 DIAGNOSIS — Z1239 Encounter for other screening for malignant neoplasm of breast: Secondary | ICD-10-CM | POA: Insufficient documentation

## 2018-04-29 DIAGNOSIS — M25541 Pain in joints of right hand: Secondary | ICD-10-CM | POA: Insufficient documentation

## 2018-04-29 NOTE — Assessment & Plan Note (Signed)
Refer to [facility] for 3D mammogram

## 2018-04-29 NOTE — Assessment & Plan Note (Signed)
She takes a statin daily . she has no side effects and liver enzymes  Have been normal ,  She is due for check   Lab Results  Component Value Date   CHOL 173 10/19/2017   HDL 55.00 10/19/2017   LDLCALC 106 (H) 10/19/2017   LDLDIRECT 109.0 10/25/2014   TRIG 60.0 10/19/2017   CHOLHDL 3 10/19/2017   Lab Results  Component Value Date   ALT 13 10/19/2017   AST 16 10/19/2017   ALKPHOS 70 10/19/2017   BILITOT 0.8 10/19/2017

## 2018-04-29 NOTE — Assessment & Plan Note (Addendum)
Well controlled (by home readings done twice weekly ) on losartan taken at night,  No side effects.    no changes today.  Lab Results  Component Value Date   CREATININE 0.76 10/19/2017   Lab Results  Component Value Date   NA 139 10/19/2017   K 3.9 10/19/2017   CL 104 10/19/2017   CO2 26 10/19/2017

## 2018-04-29 NOTE — Assessment & Plan Note (Signed)
She is requesting a steroid injection in the right thum and will be referred to  [facility]

## 2018-04-29 NOTE — Assessment & Plan Note (Signed)
By June 2016 DEXA scan.  Risks and benefits of pharmacologic therapy have been discussed with patient and she has deferred therapy  Reviewed  the current controversies surrounding the risks and benefits of calcium supplementation.  Encouraged her to increase dietary calcium through natural foods including almond/coconut milk .

## 2018-04-29 NOTE — Assessment & Plan Note (Signed)
Managed with prn use of ambien.. Refills given

## 2018-05-07 ENCOUNTER — Other Ambulatory Visit: Payer: BLUE CROSS/BLUE SHIELD

## 2018-05-12 ENCOUNTER — Encounter: Payer: BLUE CROSS/BLUE SHIELD | Admitting: Internal Medicine

## 2018-07-19 ENCOUNTER — Telehealth: Payer: Self-pay

## 2018-07-19 ENCOUNTER — Telehealth: Payer: Self-pay | Admitting: *Deleted

## 2018-07-19 DIAGNOSIS — E782 Mixed hyperlipidemia: Secondary | ICD-10-CM

## 2018-07-19 DIAGNOSIS — I10 Essential (primary) hypertension: Secondary | ICD-10-CM

## 2018-07-19 DIAGNOSIS — E559 Vitamin D deficiency, unspecified: Secondary | ICD-10-CM

## 2018-07-19 NOTE — Telephone Encounter (Signed)
Patient wants to know her blood type and requested for blood to be tested during her next lab appointment scheduled for 07/21/18 @ 8:00 am.

## 2018-07-19 NOTE — Telephone Encounter (Signed)
Please place future orders for lab appt.  

## 2018-07-20 NOTE — Addendum Note (Signed)
Addended by: Crecencio Mc on: 07/20/2018 09:41 AM   Modules accepted: Orders

## 2018-07-20 NOTE — Telephone Encounter (Signed)
appt for tomorrow was canceled and rescheduled.

## 2018-07-21 ENCOUNTER — Other Ambulatory Visit: Payer: BLUE CROSS/BLUE SHIELD

## 2018-07-26 ENCOUNTER — Encounter: Payer: BLUE CROSS/BLUE SHIELD | Admitting: Internal Medicine

## 2018-09-14 ENCOUNTER — Telehealth: Payer: Self-pay | Admitting: Internal Medicine

## 2018-09-14 NOTE — Telephone Encounter (Signed)
Sounds like the wrong treatment . Herpes usually occurs on mucous membranes (mouth,  Rectum ,  Vagina ) ,  Not the middle of the back .  If she needs a steroid cream I can call that In.   Can she take a picture of "itchy spot" or schedule a doxy visit

## 2018-09-14 NOTE — Telephone Encounter (Signed)
Patient calling to inquire if Dr. Derrel Nip has prescribed Valaciclovir, as she has a bottle with no doctors name on it from 2017. Advised patient I did not see it in her chart. Patient then inquired if Dr. Derrel Nip could start prescribing this medication for "itchy spot on middle of the back" that happens around 1-2x a year.     [facility] [place], [place] [facility] [address] Shari Prows [phone] (Phone) [phone] (Fax)

## 2018-09-15 NOTE — Telephone Encounter (Signed)
LMTCB

## 2018-10-13 ENCOUNTER — Ambulatory Visit
Admission: RE | Admit: 2018-10-13 | Discharge: 2018-10-13 | Disposition: A | Discharge status: Home / Self Care | Payer: BLUE CROSS/BLUE SHIELD | Source: Ambulatory Visit | Attending: Internal Medicine | Admitting: Internal Medicine

## 2018-10-13 ENCOUNTER — Other Ambulatory Visit: Payer: Self-pay

## 2018-10-13 ENCOUNTER — Other Ambulatory Visit: Payer: Self-pay | Admitting: Internal Medicine

## 2018-10-13 DIAGNOSIS — Z1231 Encounter for screening mammogram for malignant neoplasm of breast: Secondary | ICD-10-CM | POA: Diagnosis not present

## 2018-10-27 ENCOUNTER — Encounter: Payer: BLUE CROSS/BLUE SHIELD | Admitting: Internal Medicine

## 2018-11-02 ENCOUNTER — Other Ambulatory Visit: Payer: Self-pay

## 2018-11-03 ENCOUNTER — Ambulatory Visit (INDEPENDENT_AMBULATORY_CARE_PROVIDER_SITE_OTHER): Payer: BLUE CROSS/BLUE SHIELD | Admitting: Internal Medicine

## 2018-11-03 ENCOUNTER — Encounter: Payer: Self-pay | Admitting: Internal Medicine

## 2018-11-03 ENCOUNTER — Other Ambulatory Visit: Payer: Self-pay

## 2018-11-03 VITALS — BP 110/72 | HR 92 | Temp 97.5°F | Resp 15 | Ht 65.0 in | Wt 148.6 lb

## 2018-11-03 DIAGNOSIS — E782 Mixed hyperlipidemia: Secondary | ICD-10-CM

## 2018-11-03 DIAGNOSIS — I10 Essential (primary) hypertension: Secondary | ICD-10-CM

## 2018-11-03 DIAGNOSIS — B351 Tinea unguium: Secondary | ICD-10-CM | POA: Insufficient documentation

## 2018-11-03 DIAGNOSIS — Z0000 Encounter for general adult medical examination without abnormal findings: Secondary | ICD-10-CM

## 2018-11-03 DIAGNOSIS — Z8742 Personal history of other diseases of the female genital tract: Secondary | ICD-10-CM

## 2018-11-03 DIAGNOSIS — Z79899 Other long term (current) drug therapy: Secondary | ICD-10-CM

## 2018-11-03 DIAGNOSIS — E559 Vitamin D deficiency, unspecified: Secondary | ICD-10-CM

## 2018-11-03 DIAGNOSIS — M25541 Pain in joints of right hand: Secondary | ICD-10-CM

## 2018-11-03 DIAGNOSIS — F5104 Psychophysiologic insomnia: Secondary | ICD-10-CM

## 2018-11-03 DIAGNOSIS — Z23 Encounter for immunization: Secondary | ICD-10-CM | POA: Diagnosis not present

## 2018-11-03 MED ORDER — ZOSTER VAC RECOMB ADJUVANTED 50 MCG/0.5ML IM SUSR: 0.5000 mL | Freq: Once | INTRAMUSCULAR | 1 refills | Status: AC

## 2018-11-03 MED ORDER — ACYCLOVIR 5 % EX OINT: 1.0000 "application " | TOPICAL_OINTMENT | CUTANEOUS | 5 refills | Status: DC

## 2018-11-03 MED ORDER — TRAZODONE HCL 50 MG PO TABS: 25.0000 mg | ORAL_TABLET | Freq: Every evening | ORAL | 0 refills | Status: DC | PRN

## 2018-11-03 NOTE — Assessment & Plan Note (Signed)
Having early waking with ambien .  Did not tolerate ambien cr trial in 2018.Marland Kitchen  Anxiety playing a large role in not being able to sleep . Discussed adding  trazodone as a trial, given its delayed onset of action

## 2018-11-03 NOTE — Assessment & Plan Note (Signed)
Well controlled on current regimen. Renal function overduee, no changes today.

## 2018-11-03 NOTE — Assessment & Plan Note (Signed)
She deferred steroid injectio and is using topical voltaren.   Gardening activities ar e aggravating her pain .

## 2018-11-03 NOTE — Assessment & Plan Note (Signed)

## 2018-11-03 NOTE — Assessment & Plan Note (Addendum)
She has had normal PAP smears since 2013. Next one is due in 2022

## 2018-11-03 NOTE — Patient Instructions (Signed)
I recommend getting the Prevnar and Shingrx vaccines this year at your pharmacy  Try treating your toenail fungus naturally with vinegar or tea tree oil dabbed on the top of the nail daily after a few strokes with a disposable emery board   Trial of trazodone for insomnia .  Take at bedtime,  With ambien.  It will kick in  Later.  You can start with 1/2 tablet (25 mg 0 and increase every few days to a maximal dose of 100 mg

## 2018-11-03 NOTE — Assessment & Plan Note (Signed)
By appearance,  Bilateral great toes.  Taking a statin  So advised her to apply  tea tree oil nightly  after applying emery board

## 2018-11-03 NOTE — Progress Notes (Signed)
Patient ID: Amber Sullivan, female    DOB: [birth year]  Age: 63 y.o. MRN: [id number]  The patient is here for annual preventive  examination and management of other chronic and acute problems.  Mammogram normal setp 2020 Colonoscopy 2018 July 2018 hyperplastic polyps Normal PAP 2019 TdAP, SHINGRX AND PNEUMONIA VACCINES     The risk factors are reflected in the social history.  The roster of all physicians providing medical care to patient - is listed in the Snapshot section of the chart.  Activities of daily living:  The patient is 100% independent in all ADLs: dressing, toileting, feeding as well as independent mobility  Home safety : The patient has smoke detectors in the home. They wear seatbelts.  There are no firearms at home. There is no violence in the home.   There is no risks for hepatitis, STDs or HIV. There is no   history of blood transfusion. They have no travel history to infectious disease endemic areas of the world.  The patient has seen their dentist in the last six month. They have seen their eye doctor in the last year. They admit to slight hearing difficulty with regard to whispered voices and some television programs.  They have deferred audiologic testing in the last year.  They do not  have excessive sun exposure. Discussed the need for sun protection: hats, long sleeves and use of sunscreen if there is significant sun exposure.   Diet: the importance of a healthy diet is discussed. They do have a healthy diet.  The benefits of regular aerobic exercise were discussed. She walks 4 times per week ,  20 minutes.   Depression screen: there are no signs or vegative symptoms of depression- irritability, change in appetite, anhedonia, sadness/tearfullness.  Cognitive assessment: the patient manages all their financial and personal affairs and is actively engaged. They could relate day,date,year and events; recalled 2/3 objects at 3 minutes; performed clock-face test  normally.  The following portions of the patient's history were reviewed and updated as appropriate: allergies, current medications, past family history, past medical history,  past surgical history, past social history  and problem list.  Visual acuity was not assessed per patient preference since she has regular follow up with her ophthalmologist. Hearing and body mass index were assessed and reviewed.   During the course of the visit the patient was educated and counseled about appropriate screening and preventive services including : fall prevention , diabetes screening, nutrition counseling, colorectal cancer screening, and recommended immunizations.    CC: The primary encounter diagnosis was Encounter for preventive health examination. Diagnoses of Need for immunization against influenza, Vitamin D deficiency, Mixed hyperlipidemia, Essential hypertension, Need for diphtheria-tetanus-pertussis (Tdap) vaccine, History of abnormal cervical Pap smear, Pain in thumb joint with movement of right hand, Chronic insomnia, Long-term use of high-risk medication, and Onychomycosis of great toe were also pertinent to this visit.   Insomnia: ] Using ambien but waking up at 2 am 3 to 4 times per week.  Has tried reading,  Getting worse.  Aggravated by current events   Hypertension: patient checks blood pressure twice weekly at home.  Readings have been for the most part > 140/80 at rest . Patient is following a reduce salt diet most days and is taking medications as prescribed    Fire ant bites 5 times this summer     History [place] has a past medical history of Fracture of lateral malleolus (2009), Hyperlipidemia, Hypertension (2003), and Positive colorectal cancer  screening using Cologuard test (04/20/2016).   She has a past surgical history that includes Cholecystectomy (2005); Squamous cell carcinoma excision (2007); Mohs surgery (2007); and Colonoscopy with propofol (N/A, 08/13/2016).   Her family  history includes Diabetes in her mother; Heart attack in her father, maternal grandfather, and paternal grandfather; Heart disease in her father, paternal grandfather, and paternal grandmother; Hyperlipidemia in her father; Hypertension in her father and mother; Stroke (age of onset: 77) in her mother.She reports that she has never smoked. She has never used smokeless tobacco. She reports current alcohol use. She reports that she does not use drugs.  Outpatient Medications Prior to Visit  Medication Sig Dispense Refill  . Calcium Carbonate-Vit D-Min (CALTRATE 600+D PLUS MINERALS) 600-800 MG-UNIT TABS Take 1 tablet by mouth daily. Taking 1 tablet every other day    . Diclofenac Sodium (PENNSAID) 2 % SOLN Place 2 application onto the skin 2 (two) times daily as needed. 112 g 6  . diphenhydrAMINE (BENADRYL) 25 MG tablet Take 25 mg by mouth daily.    Marland Kitchen estradiol (ESTRACE VAGINAL) 0.1 MG/GM vaginal cream Place 1 Applicatorful vaginally 3 (three) times a week. 42.5 g 2  . losartan (COZAAR) 100 MG tablet Take 1 tablet (100 mg total) by mouth daily. 90 tablet 1  . simvastatin (ZOCOR) 20 MG tablet Take 1 tablet (20 mg total) by mouth every other day. 90 tablet 1  . zolpidem (AMBIEN) 5 MG tablet Take 1 tablet (5 mg total) by mouth at bedtime as needed for sleep. 90 tablet 1  . albuterol (PROVENTIL HFA;VENTOLIN HFA) 108 (90 Base) MCG/ACT inhaler Take 2 puffs 2 times per day for 5 days, and may use every 6 hours as needed. After 5 days, use as needed (Patient not taking: Reported on 11/03/2018) 1 Inhaler 0  . benzonatate (TESSALON) 200 MG capsule Take 1 capsule (200 mg total) by mouth 3 (three) times daily as needed for cough. (Patient not taking: Reported on 11/03/2018) 60 capsule 1  . benzonatate (TESSALON) 200 MG capsule Take 1 capsule (200 mg total) by mouth 3 (three) times daily as needed for cough. (Patient not taking: Reported on 04/28/2018) 60 capsule 1   No facility-administered medications prior to visit.      Review of Systems   Patient denies headache, fevers, malaise, unintentional weight loss, skin rash, eye pain, sinus congestion and sinus pain, sore throat, dysphagia,  hemoptysis , cough, dyspnea, wheezing, chest pain, palpitations, orthopnea, edema, abdominal pain, nausea, melena, diarrhea, constipation, flank pain, dysuria, hematuria, urinary  Frequency, nocturia, numbness, tingling, seizures,  Focal weakness, Loss of consciousness,  Tremor, insomnia, depression, anxiety, and suicidal ideation.     Objective:  BP 110/72 (BP Location: Left Arm, Patient Position: Sitting, Cuff Size: Normal)   Pulse 92   Temp (!) 97.5 F (36.4 C) (Temporal)   Resp 15   Ht 5\' 5"  (1.651 m)   Wt 148 lb 9.6 oz (67.4 kg)   SpO2 97%   BMI 24.73 kg/m   Physical Exam    Assessment & Plan:   Problem List Items Addressed This Visit      Unprioritized   Hyperlipidemia   Long-term use of high-risk medication (Chronic)    lfts are overdue for 6 month follow up  Lab Results  Component Value Date   ALT 13 10/19/2017   AST 16 10/19/2017   ALKPHOS 70 10/19/2017   BILITOT 0.8 10/19/2017        Hypertension    Well controlled on  current regimen. Renal function overduee, no changes today.      Encounter for preventive health examination - Primary    age appropriate education and counseling updated, referrals for preventative services and immunizations addressed, dietary and smoking counseling addressed, most recent labs reviewed.  I have personally reviewed and have noted:  1) the patient's medical and social history 2) The pt's use of alcohol, tobacco, and illicit drugs 3) The patient's current medications and supplements 4) Functional ability including ADL's, fall risk, home safety risk, hearing and visual impairment 5) Diet and physical activities 6) Evidence for depression or mood disorder 7) The patient's height, weight, and BMI have been recorded in the chart  I have made referrals,  and provided counseling and education based on review of the above      History of abnormal cervical Pap smear    She has had normal PAP smears since 2013. Next one is due in 2022      Chronic insomnia    Having early waking with ambien .  Did not tolerate ambien cr trial in 2018.Marland Kitchen  Anxiety playing a large role in not being able to sleep . Discussed adding  trazodone as a trial, given its delayed onset of action       Pain in thumb joint with movement of right hand    She deferred steroid injectio and is using topical voltaren.   Gardening activities ar e aggravating her pain .      Onychomycosis of great toe    By appearance,  Bilateral great toes.  Taking a statin  So advised her to apply  tea tree oil nightly  after applying emery board        Relevant Medications   Zoster Vaccine Adjuvanted [facility]) injection   acyclovir ointment (ZOVIRAX) 5 %    Other Visit Diagnoses    Need for immunization against influenza       Relevant Orders   Flu Vaccine QUAD High Dose(Fluad) (Completed)   Vitamin D deficiency       Need for diphtheria-tetanus-pertussis (Tdap) vaccine       Relevant Orders   Tdap vaccine greater than or equal to 7yo IM (Completed)      I am having Jasslyn L. Gravlin start on Zoster Vaccine Adjuvanted, acyclovir ointment, and traZODone. I am also having her maintain her Caltrate 600+D Plus Minerals, diphenhydrAMINE, Diclofenac Sodium, estradiol, albuterol, losartan, simvastatin, zolpidem, and benzonatate.  Meds ordered this encounter  Medications  . Zoster Vaccine Adjuvanted [facility]) injection    Sig: Inject 0.5 mLs into the muscle once for 1 dose.    Dispense:  1 each    Refill:  1  . acyclovir ointment (ZOVIRAX) 5 %    Sig: Apply 1 application topically every 3 (three) hours.    Dispense:  30 g    Refill:  5  . traZODone (DESYREL) 50 MG tablet    Sig: Take 0.5-1 tablets (25-50 mg total) by mouth at bedtime as needed for sleep.    Dispense:  90 tablet     Refill:  0    Medications Discontinued During This Encounter  Medication Reason  . benzonatate (TESSALON) [id number] MG capsule Duplicate    Follow-up: Return in about 6 months (around 05/04/2019).   Crecencio Mc, MD

## 2018-11-03 NOTE — Assessment & Plan Note (Signed)
lfts are overdue for 6 month follow up  Lab Results  Component Value Date   ALT 13 10/19/2017   AST 16 10/19/2017   ALKPHOS 70 10/19/2017   BILITOT 0.8 10/19/2017

## 2018-11-04 LAB — COMPREHENSIVE METABOLIC PANEL
ALT: 13 U/L (ref 0–35)
AST: 13 U/L (ref 0–37)
Albumin: 4.1 g/dL (ref 3.5–5.2)
Alkaline Phosphatase: 65 U/L (ref 39–117)
BUN: 16 mg/dL (ref 6–23)
CO2: 28 mEq/L (ref 19–32)
Calcium: 9.4 mg/dL (ref 8.4–10.5)
Chloride: 103 mEq/L (ref 96–112)
Creatinine, Ser: 0.87 mg/dL (ref 0.40–1.20)
GFR: 65.33 mL/min (ref >60.00)
Glucose, Bld: 92 mg/dL (ref 70–99)
Potassium: 4.1 mEq/L (ref 3.5–5.1)
Sodium: 139 mEq/L (ref 135–145)
Total Bilirubin: 0.6 mg/dL (ref 0.2–1.2)
Total Protein: 6.6 g/dL (ref 6.0–8.3)

## 2018-11-04 LAB — LIPID PANEL
Cholesterol: 192 mg/dL (ref 0–200)
HDL: 59.9 mg/dL (ref >39.00)
LDL Cholesterol: 115 mg/dL — ABNORMAL HIGH (ref 0–99)
NonHDL: 131.64
Total CHOL/HDL Ratio: 3
Triglycerides: 83 mg/dL (ref 0.0–149.0)
VLDL: 16.6 mg/dL (ref 0.0–40.0)

## 2018-11-04 LAB — TSH: TSH: 1.91 u[IU]/mL (ref 0.35–4.50)

## 2018-11-04 LAB — VITAMIN D 25 HYDROXY (VIT D DEFICIENCY, FRACTURES): VITD: 32.7 ng/mL (ref 30.00–100.00)

## 2018-11-17 DIAGNOSIS — Z08 Encounter for follow-up examination after completed treatment for malignant neoplasm: Secondary | ICD-10-CM | POA: Diagnosis not present

## 2018-11-17 DIAGNOSIS — Z85828 Personal history of other malignant neoplasm of skin: Secondary | ICD-10-CM | POA: Diagnosis not present

## 2018-11-17 DIAGNOSIS — L821 Other seborrheic keratosis: Secondary | ICD-10-CM | POA: Diagnosis not present

## 2018-11-17 DIAGNOSIS — I788 Other diseases of capillaries: Secondary | ICD-10-CM | POA: Diagnosis not present

## 2018-11-22 ENCOUNTER — Other Ambulatory Visit: Payer: Self-pay | Admitting: Internal Medicine

## 2018-11-22 MED ORDER — ACYCLOVIR 400 MG PO TABS: 400.0000 mg | ORAL_TABLET | Freq: Every day | ORAL | 1 refills | Status: DC | PRN

## 2018-11-22 NOTE — Telephone Encounter (Signed)
Copied from [place] [id number]. Topic: Quick Communication - Rx Refill/Question >> Nov 22, 2018  2:09 PM Rainey Pines A wrote: [place] (Patient stated that insurance will cover medication if tablet form is called in.)  Has the patient contacted their pharmacy?Yes (Agent: If no, request that the patient contact the pharmacy for the refill.) (Agent: If yes, when and what did the pharmacy advise?)Contact PCP  Preferred Pharmacy (with phone number or street name): [id number] - [place], [place] [phone] (Phone) [phone] (Fax)    Agent: Please be advised that RX refills may take up to 3 business days. We ask that you follow-up with your pharmacy.

## 2018-11-22 NOTE — Telephone Encounter (Signed)
Ok,  I have refilled it the 400 mg dose

## 2018-11-22 NOTE — Telephone Encounter (Signed)
Sorry prescribed by PCP on 11/03/18 in ointment form for, note says start Zoster Vaccine an acyclovir ointment. Cannot reach patient to find DX.

## 2018-11-22 NOTE — Telephone Encounter (Signed)
Wrong office

## 2018-11-22 NOTE — Telephone Encounter (Signed)
This has not been filled by this office,  I don't have a diagnosis for it,   and it does not come in 200 mg so please obtain more informatio n from patient before agreeing to refill

## 2018-11-22 NOTE — Telephone Encounter (Signed)
Patient returning call to office after hours. She states she has a "spot" that shows up on back that tingles- someone has told her it is a form of shingles. Her former dermatologist (in [place], now retired) may have prescribed. She states she has spoken with Dr. Derrel Nip about the pill specifically, although she cannot remember the mg.

## 2018-11-22 NOTE — Telephone Encounter (Signed)
Needs tablet form of zovirax I do not see a 200 mg tablet can do 400 mg and break in half?

## 2018-11-23 NOTE — Telephone Encounter (Signed)
Patient aware that refill has been sent to pharmacy.

## 2018-12-01 ENCOUNTER — Telehealth: Payer: Self-pay | Admitting: Internal Medicine

## 2018-12-01 MED ORDER — ZOLPIDEM TARTRATE 5 MG PO TABS: 5.0000 mg | ORAL_TABLET | Freq: Every evening | ORAL | 1 refills | Status: DC | PRN

## 2018-12-01 NOTE — Telephone Encounter (Signed)
Pt called in to request a refill for zolpidem (AMBIEN) 5 MG tablet    Pharmacy :[facility]

## 2018-12-01 NOTE — Telephone Encounter (Signed)
Med refilled.

## 2018-12-01 NOTE — Telephone Encounter (Signed)
Last OV:  11/03/18  Last Refill:  04/28/18

## 2018-12-01 NOTE — Telephone Encounter (Signed)
Called and spoke to pt.  Pt needs Rx [facility], not [facility].

## 2018-12-10 MED ORDER — ZOLPIDEM TARTRATE 5 MG PO TABS: 5.0000 mg | ORAL_TABLET | Freq: Every evening | ORAL | 1 refills | Status: DC | PRN

## 2018-12-10 NOTE — Telephone Encounter (Signed)
Pt would like to know if she could get her Ambien [facility] to [facility] not [facility].

## 2018-12-10 NOTE — Telephone Encounter (Signed)
Canceled the refill that was on file but the pharmacist stated that she did pick up a 30 day on 12/05/2018.

## 2018-12-10 NOTE — Telephone Encounter (Signed)
If she has not picked up the [place] from [employer] [facility].  Please call [facility] and dc the rx .  I have sent to mail order

## 2018-12-10 NOTE — Addendum Note (Signed)
Addended by: Crecencio Mc on: 12/10/2018 11:05 AM   Modules accepted: Orders

## 2019-01-03 ENCOUNTER — Other Ambulatory Visit: Payer: Self-pay | Admitting: Internal Medicine

## 2019-03-15 ENCOUNTER — Other Ambulatory Visit: Payer: Self-pay

## 2019-03-21 ENCOUNTER — Encounter: Payer: Self-pay | Admitting: Internal Medicine

## 2019-03-21 ENCOUNTER — Ambulatory Visit (INDEPENDENT_AMBULATORY_CARE_PROVIDER_SITE_OTHER): Payer: BC Managed Care – PPO | Admitting: Internal Medicine

## 2019-03-21 VITALS — BP 126/66 | HR 90 | Temp 97.7°F | Resp 15 | Ht 65.0 in | Wt 149.8 lb

## 2019-03-21 DIAGNOSIS — E559 Vitamin D deficiency, unspecified: Secondary | ICD-10-CM | POA: Diagnosis not present

## 2019-03-21 DIAGNOSIS — R195 Other fecal abnormalities: Secondary | ICD-10-CM | POA: Diagnosis not present

## 2019-03-21 DIAGNOSIS — D492 Neoplasm of unspecified behavior of bone, soft tissue, and skin: Secondary | ICD-10-CM

## 2019-03-21 DIAGNOSIS — I10 Essential (primary) hypertension: Secondary | ICD-10-CM

## 2019-03-21 DIAGNOSIS — E782 Mixed hyperlipidemia: Secondary | ICD-10-CM | POA: Diagnosis not present

## 2019-03-21 NOTE — Progress Notes (Signed)
Subjective:  Patient ID: Amber Sullivan, female    DOB: March 23, 1955  Age: 64 y.o. MRN: [id number]  CC: The primary encounter diagnosis was Mixed hyperlipidemia. Diagnoses of Vitamin D deficiency, Neoplasm of skin of face, Positive colorectal cancer screening using DNA-based stool test, and Essential hypertension were also pertinent to this visit.  HPI Amber Sullivan presents for evaluation of an erythematous rough patch of skin that appeared on her left  cheekbone approximately 4 weeks ago. There was no history of trauma, and  Area Is not in contact with facemask for more than 15 minutes as patient works from home.  The rough texture has improved slightly with use of Oil of Olay moisturizer with alpha hydroxy but remains raised and red.  It has not bled or increased in size.  She has no history of rosacea.  She has used sunblock on her face daily since she had a squamous cell CA removed from right N/s fold about 12 years ago.  She has an annual dermatology appt with Dr Kellie Moor but the appointment is in October.   HTN:   Patient is taking her medications as prescribed and notes no adverse effects.  Home BP readings have been done about once per week and are  generally < 130/80 .  She is avoiding added salt in her diet and walking regularly a minimum of 5 times per week for exercise .  Family history of CAD  Her 31 yr old brother was diagnosed with CAD with an occluded LAD.  We reviewed her risk factors including hyperlipidemia and hypertension  .  Outpatient Medications Prior to Visit  Medication Sig Dispense Refill  . acyclovir (ZOVIRAX) 400 MG tablet Take 1 tablet (400 mg total) by mouth 5 (five) times daily as needed (shingles outbreak). 35 tablet 1  . acyclovir ointment (ZOVIRAX) 5 % Apply 1 application topically every 3 (three) hours. 30 g 5  . albuterol (PROVENTIL HFA;VENTOLIN HFA) 108 (90 Base) MCG/ACT inhaler Take 2 puffs 2 times per day for 5 days, and may use every 6 hours as  needed. After 5 days, use as needed 1 Inhaler 0  . benzonatate (TESSALON) 200 MG capsule Take 1 capsule (200 mg total) by mouth 3 (three) times daily as needed for cough. 60 capsule 1  . Calcium Carbonate-Vit D-Min (CALTRATE 600+D PLUS MINERALS) 600-800 MG-UNIT TABS Take 1 tablet by mouth daily. Taking 1 tablet every other day    . Diclofenac Sodium (PENNSAID) 2 % SOLN Place 2 application onto the skin 2 (two) times daily as needed. 112 g 6  . diphenhydrAMINE (BENADRYL) 25 MG tablet Take 25 mg by mouth daily.    Marland Kitchen estradiol (ESTRACE VAGINAL) 0.1 MG/GM vaginal cream Place 1 Applicatorful vaginally 3 (three) times a week. 42.5 g 2  . losartan (COZAAR) 100 MG tablet TAKE 1 TABLET DAILY 90 tablet 3  . simvastatin (ZOCOR) 20 MG tablet Take 1 tablet (20 mg total) by mouth every other day. 90 tablet 1  . traZODone (DESYREL) 50 MG tablet Take 0.5-1 tablets (25-50 mg total) by mouth at bedtime as needed for sleep. 90 tablet 0  . zolpidem (AMBIEN) 5 MG tablet Take 1 tablet (5 mg total) by mouth at bedtime as needed for sleep. 90 tablet 1   No facility-administered medications prior to visit.    Review of Systems;  Patient denies headache, fevers, malaise, unintentional weight loss, skin rash, eye pain, sinus congestion and sinus pain, sore throat, dysphagia,  hemoptysis ,  cough, dyspnea, wheezing, chest pain, palpitations, orthopnea, edema, abdominal pain, nausea, melena, diarrhea, constipation, flank pain, dysuria, hematuria, urinary  Frequency, nocturia, numbness, tingling, seizures,  Focal weakness, Loss of consciousness,  Tremor, insomnia, depression, anxiety, and suicidal ideation.      Objective:  BP 126/66 (BP Location: Left Arm, Patient Position: Sitting, Cuff Size: Normal)   Pulse 90   Temp 97.7 F (36.5 C) (Temporal)   Resp 15   Ht 5\' 5"  (1.651 m)   Wt 149 lb 12.8 oz (67.9 kg)   SpO2 98%   BMI 24.93 kg/m   BP Readings from Last 3 Encounters:  03/21/19 126/66  11/03/18 110/72   02/15/18 112/60    Wt Readings from Last 3 Encounters:  03/21/19 149 lb 12.8 oz (67.9 kg)  11/03/18 148 lb 9.6 oz (67.4 kg)  02/15/18 150 lb (68 kg)    General appearance: alert, cooperative and appears stated age Face: dime sized annular erythematous raised patch on zygomatic arch,  Left side . No telangiectasias, pores or raised edges.  Ears: normal TM's and external ear canals both ears Throat: lips, mucosa, and tongue normal; teeth and gums normal Neck: no adenopathy, no carotid bruit, supple, symmetrical, trachea midline and thyroid not enlarged, symmetric, no tenderness/mass/nodules Back: symmetric, no curvature. ROM normal. No CVA tenderness. Lungs: clear to auscultation bilaterally Heart: regular rate and rhythm, S1, S2 normal, no murmur, click, rub or gallop Skin: Skin color, texture, turgor normal. No rashes or lesions except on face  Lymph nodes: Cervical, supraclavicular, and axillary nodes normal.   MM 3D SCREEN BREAST BILATERAL  Result Date: 10/19/2018 CLINICAL DATA:  Screening. EXAM: DIGITAL SCREENING BILATERAL MAMMOGRAM WITH TOMO AND CAD COMPARISON:  Previous exam(s). ACR Breast Density Category b: There are scattered areas of fibroglandular density. FINDINGS: There are no findings suspicious for malignancy. Images were processed with CAD. IMPRESSION: No mammographic evidence of malignancy. A result letter of this screening mammogram will be mailed directly to the patient. RECOMMENDATION: Screening mammogram in one year. (Code:SM-B-01Y) BI-RADS CATEGORY  1: Negative. Electronically Signed   By: Marin Olp M.D.   On: 10/19/2018 12:32    Assessment & Plan:   Problem List Items Addressed This Visit      Unprioritized   Hyperlipidemia - Primary    managed with simvastatin. LDL and triglycerides are at goal on current medications. She has no side effects and liver enzymes are due for recheck in April . No changes today  Lab Results  Component Value Date   CHOL 192  11/03/2018   HDL 59.90 11/03/2018   LDLCALC 115 (H) 11/03/2018   LDLDIRECT 109.0 10/25/2014   TRIG 83.0 11/03/2018   CHOLHDL 3 11/03/2018   Lab Results  Component Value Date   ALT 13 11/03/2018   AST 13 11/03/2018   ALKPHOS 65 11/03/2018   BILITOT 0.6 11/03/2018         Relevant Orders   Comprehensive metabolic panel   TSH   Lipid panel   Hypertension    Well controlled on current regimen. Renal function stable, no changes today. Repeat labs due in April       Positive colorectal cancer screening using DNA-based stool test    Diagnostic colonoscopy done July 2018 yielded 3 hyperplastic polyps ([place]).  10 yr follow up advised in 2028       Other Visit Diagnoses    Vitamin D deficiency       Relevant Orders   VITAMIN D 25 Hydroxy (Vit-D Deficiency,  Fractures)   Neoplasm of skin of face       Relevant Orders   Ambulatory referral to Dermatology     I provided  30 minutes of  face-to-face time during this encounter reviewing patient's current problems and past surgeries, labs and imaging studies, providing counseling on the above mentioned problems , and coordination  of care .  I am having Elmo L. Donnell maintain her Caltrate 600+D Plus Minerals, diphenhydrAMINE, Diclofenac Sodium, estradiol, albuterol, simvastatin, benzonatate, acyclovir ointment, traZODone, acyclovir, zolpidem, and losartan.  No orders of the defined types were placed in this encounter.   There are no discontinued medications.  Follow-up: No follow-ups on file.   Crecencio Mc, MD

## 2019-03-21 NOTE — Patient Instructions (Signed)
Referral to Dr Kellie Moor or first available to look a the spot on your cheek

## 2019-03-22 ENCOUNTER — Encounter: Payer: Self-pay | Admitting: Internal Medicine

## 2019-03-22 NOTE — Assessment & Plan Note (Signed)
Well controlled on current regimen. Renal function stable, no changes today. Repeat labs due in April

## 2019-03-22 NOTE — Assessment & Plan Note (Signed)
Diagnostic colonoscopy done July 2018 yielded 3 hyperplastic polyps ([place]).  10 yr follow up advised in 2028

## 2019-03-22 NOTE — Assessment & Plan Note (Signed)
managed with simvastatin. LDL and triglycerides are at goal on current medications. She has no side effects and liver enzymes are due for recheck in April . No changes today  Lab Results  Component Value Date   CHOL 192 11/03/2018   HDL 59.90 11/03/2018   LDLCALC 115 (H) 11/03/2018   LDLDIRECT 109.0 10/25/2014   TRIG 83.0 11/03/2018   CHOLHDL 3 11/03/2018   Lab Results  Component Value Date   ALT 13 11/03/2018   AST 13 11/03/2018   ALKPHOS 65 11/03/2018   BILITOT 0.6 11/03/2018

## 2019-03-30 DIAGNOSIS — H16223 Keratoconjunctivitis sicca, not specified as Sjogren's, bilateral: Secondary | ICD-10-CM | POA: Diagnosis not present

## 2019-04-06 DIAGNOSIS — D4989 Neoplasm of unspecified behavior of other specified sites: Secondary | ICD-10-CM

## 2019-04-06 DIAGNOSIS — L570 Actinic keratosis: Secondary | ICD-10-CM | POA: Diagnosis not present

## 2019-04-06 DIAGNOSIS — L918 Other hypertrophic disorders of the skin: Secondary | ICD-10-CM | POA: Diagnosis not present

## 2019-04-10 DIAGNOSIS — D4989 Neoplasm of unspecified behavior of other specified sites: Secondary | ICD-10-CM | POA: Insufficient documentation

## 2019-04-10 NOTE — Assessment & Plan Note (Signed)
Treated by Hinda Kehr March 2021

## 2019-05-02 ENCOUNTER — Other Ambulatory Visit: Payer: Self-pay

## 2019-05-02 ENCOUNTER — Other Ambulatory Visit (INDEPENDENT_AMBULATORY_CARE_PROVIDER_SITE_OTHER): Payer: BC Managed Care – PPO

## 2019-05-02 DIAGNOSIS — E559 Vitamin D deficiency, unspecified: Secondary | ICD-10-CM | POA: Diagnosis not present

## 2019-05-02 DIAGNOSIS — E782 Mixed hyperlipidemia: Secondary | ICD-10-CM | POA: Diagnosis not present

## 2019-05-02 LAB — COMPREHENSIVE METABOLIC PANEL
ALT: 11 U/L (ref 0–35)
AST: 13 U/L (ref 0–37)
Albumin: 3.8 g/dL (ref 3.5–5.2)
Alkaline Phosphatase: 66 U/L (ref 39–117)
BUN: 13 mg/dL (ref 6–23)
CO2: 27 mEq/L (ref 19–32)
Calcium: 8.9 mg/dL (ref 8.4–10.5)
Chloride: 105 mEq/L (ref 96–112)
Creatinine, Ser: 0.81 mg/dL (ref 0.40–1.20)
GFR: 70.84 mL/min (ref >60.00)
Glucose, Bld: 93 mg/dL (ref 70–99)
Potassium: 4.2 mEq/L (ref 3.5–5.1)
Sodium: 138 mEq/L (ref 135–145)
Total Bilirubin: 0.8 mg/dL (ref 0.2–1.2)
Total Protein: 6.3 g/dL (ref 6.0–8.3)

## 2019-05-02 LAB — LIPID PANEL
Cholesterol: 170 mg/dL (ref 0–200)
HDL: 51.1 mg/dL (ref >39.00)
LDL Cholesterol: 102 mg/dL — ABNORMAL HIGH (ref 0–99)
NonHDL: 118.57
Total CHOL/HDL Ratio: 3
Triglycerides: 81 mg/dL (ref 0.0–149.0)
VLDL: 16.2 mg/dL (ref 0.0–40.0)

## 2019-05-02 LAB — VITAMIN D 25 HYDROXY (VIT D DEFICIENCY, FRACTURES): VITD: 28.95 ng/mL — ABNORMAL LOW (ref 30.00–100.00)

## 2019-05-02 LAB — TSH: TSH: 1.89 u[IU]/mL (ref 0.35–4.50)

## 2019-05-05 ENCOUNTER — Other Ambulatory Visit: Payer: Self-pay

## 2019-05-05 ENCOUNTER — Encounter: Payer: Self-pay | Admitting: Internal Medicine

## 2019-05-05 ENCOUNTER — Ambulatory Visit (INDEPENDENT_AMBULATORY_CARE_PROVIDER_SITE_OTHER): Payer: BC Managed Care – PPO | Admitting: Internal Medicine

## 2019-05-05 VITALS — BP 124/68 | HR 84 | Temp 96.7°F | Resp 14 | Ht 65.0 in | Wt 151.0 lb

## 2019-05-05 DIAGNOSIS — I10 Essential (primary) hypertension: Secondary | ICD-10-CM

## 2019-05-05 DIAGNOSIS — R05 Cough: Secondary | ICD-10-CM | POA: Diagnosis not present

## 2019-05-05 DIAGNOSIS — R059 Cough, unspecified: Secondary | ICD-10-CM

## 2019-05-05 DIAGNOSIS — D4989 Neoplasm of unspecified behavior of other specified sites: Secondary | ICD-10-CM

## 2019-05-05 DIAGNOSIS — Z1231 Encounter for screening mammogram for malignant neoplasm of breast: Secondary | ICD-10-CM | POA: Diagnosis not present

## 2019-05-05 DIAGNOSIS — F5104 Psychophysiologic insomnia: Secondary | ICD-10-CM

## 2019-05-05 DIAGNOSIS — E782 Mixed hyperlipidemia: Secondary | ICD-10-CM

## 2019-05-05 DIAGNOSIS — M810 Age-related osteoporosis without current pathological fracture: Secondary | ICD-10-CM | POA: Diagnosis not present

## 2019-05-05 MED ORDER — ESTRADIOL 0.1 MG/GM VA CREA: 1.0000 | TOPICAL_CREAM | VAGINAL | 2 refills | Status: DC

## 2019-05-05 MED ORDER — ZOLPIDEM TARTRATE 5 MG PO TABS: 5.0000 mg | ORAL_TABLET | Freq: Every evening | ORAL | 1 refills | Status: DC | PRN

## 2019-05-05 MED ORDER — ALBUTEROL SULFATE HFA 108 (90 BASE) MCG/ACT IN AERS: INHALATION_SPRAY | RESPIRATORY_TRACT | 2 refills | Status: DC

## 2019-05-05 MED ORDER — SIMVASTATIN 20 MG PO TABS: 20.0000 mg | ORAL_TABLET | ORAL | 3 refills | Status: DC

## 2019-05-05 NOTE — Progress Notes (Signed)
Subjective:  Patient ID: Amber Sullivan, female    DOB: 1955-12-27  Age: 64 y.o. MRN: [id number]  CC: The primary encounter diagnosis was Breast cancer screening by mammogram. Diagnoses of Cough in adult, Age-related osteoporosis without current pathological fracture, Neoplasm of cheek, Chronic insomnia, Mixed hyperlipidemia, Essential hypertension, and Cough were also pertinent to this visit.  HPI Amber Sullivan presents for follow up on hypertension,  Hyperlipidemia, and chronic insomnia managed with Lorrin Mais   This visit occurred during the SARS-CoV-2 public health emergency.  Safety protocols were in place, including screening questions prior to the visit, additional usage of staff PPE, and extensive cleaning of exam room while observing appropriate contact time as indicated for disinfecting solutions.    Patient has received both doses of the [place] 19 vaccine without complications.  Patient continues to mask when outside of the home except when walking in yard or at safe distances from others .  Patient denies any change in mood or development of unhealthy behaviors resuting from the pandemic's restriction of activities and socialization.    Recent labs reviewed with patient  TAKING CLATRATE PLUS D  Insomnia . retiring in 2 weeks,  Discussed a wean from [place].    Recurrent winter cough now resolved .  Lasted 5-6 weeks,  No other symptoms  Used pro air and tessalon.     Patient is taking her medications as prescribed and notes no adverse effects.  Home BP readings have been done about once per week and are  generally < 130/80 .  She is avoiding added salt in her diet and walking regularly about 3 times per week for exercise  .     Outpatient Medications Prior to Visit  Medication Sig Dispense Refill  . acyclovir (ZOVIRAX) 400 MG tablet Take 1 tablet (400 mg total) by mouth 5 (five) times daily as needed (shingles outbreak). 35 tablet 1  . benzonatate (TESSALON) 200  MG capsule Take 1 capsule (200 mg total) by mouth 3 (three) times daily as needed for cough. 60 capsule 1  . Calcium Carbonate-Vit D-Min (CALTRATE 600+D PLUS MINERALS) 600-800 MG-UNIT TABS Take 1 tablet by mouth daily. Taking 1 tablet every other day    . Diclofenac Sodium (PENNSAID) 2 % SOLN Place 2 application onto the skin 2 (two) times daily as needed. 112 g 6  . diphenhydrAMINE (BENADRYL) 25 MG tablet Take 25 mg by mouth daily.    Marland Kitchen losartan (COZAAR) 100 MG tablet TAKE 1 TABLET DAILY 90 tablet 3  . traZODone (DESYREL) 50 MG tablet Take 0.5-1 tablets (25-50 mg total) by mouth at bedtime as needed for sleep. 90 tablet 0  . albuterol (PROVENTIL HFA;VENTOLIN HFA) 108 (90 Base) MCG/ACT inhaler Take 2 puffs 2 times per day for 5 days, and may use every 6 hours as needed. After 5 days, use as needed 1 Inhaler 0  . estradiol (ESTRACE VAGINAL) 0.1 MG/GM vaginal cream Place 1 Applicatorful vaginally 3 (three) times a week. 42.5 g 2  . simvastatin (ZOCOR) 20 MG tablet Take 1 tablet (20 mg total) by mouth every other day. 90 tablet 1  . zolpidem (AMBIEN) 5 MG tablet Take 1 tablet (5 mg total) by mouth at bedtime as needed for sleep. 90 tablet 1  . acyclovir ointment (ZOVIRAX) 5 % Apply 1 application topically every 3 (three) hours. (Patient not taking: Reported on 05/05/2019) 30 g 5   No facility-administered medications prior to visit.    Review of Systems;  Patient  denies headache, fevers, malaise, unintentional weight loss, skin rash, eye pain, sinus congestion and sinus pain, sore throat, dysphagia,  hemoptysis , cough, dyspnea, wheezing, chest pain, palpitations, orthopnea, edema, abdominal pain, nausea, melena, diarrhea, constipation, flank pain, dysuria, hematuria, urinary  Frequency, nocturia, numbness, tingling, seizures,  Focal weakness, Loss of consciousness,  Tremor, insomnia, depression, anxiety, and suicidal ideation.      Objective:  BP 124/68 (BP Location: Left Arm, Patient Position:  Sitting, Cuff Size: Normal)   Pulse 84   Temp (!) 96.7 F (35.9 C) (Temporal)   Resp 14   Ht 5\' 5"  (1.651 m)   Wt 151 lb (68.5 kg)   SpO2 98%   BMI 25.13 kg/m   BP Readings from Last 3 Encounters:  05/05/19 124/68  03/21/19 126/66  11/03/18 110/72    Wt Readings from Last 3 Encounters:  05/05/19 151 lb (68.5 kg)  03/21/19 149 lb 12.8 oz (67.9 kg)  11/03/18 148 lb 9.6 oz (67.4 kg)    General appearance: alert, cooperative and appears stated age Ears: normal TM's and external ear canals both ears Throat: lips, mucosa, and tongue normal; teeth and gums normal Neck: no adenopathy, no carotid bruit, supple, symmetrical, trachea midline and thyroid not enlarged, symmetric, no tenderness/mass/nodules Back: symmetric, no curvature. ROM normal. No CVA tenderness. Lungs: clear to auscultation bilaterally Heart: regular rate and rhythm, S1, S2 normal, no murmur, click, rub or gallop Abdomen: soft, non-tender; bowel sounds normal; no masses,  no organomegaly Pulses: 2+ and symmetric Skin: Skin color, texture, turgor normal. No rashes or lesions Lymph nodes: Cervical, supraclavicular, and axillary nodes normal.  No results found for: HGBA1C  Lab Results  Component Value Date   CREATININE 0.81 05/02/2019   CREATININE 0.87 11/03/2018   CREATININE 0.76 10/19/2017    Lab Results  Component Value Date   WBC 6.3 11/19/2015   HGB 13.6 11/19/2015   HCT 40.7 11/19/2015   PLT 208.0 11/19/2015   GLUCOSE 93 05/02/2019   CHOL 170 05/02/2019   TRIG 81.0 05/02/2019   HDL 51.10 05/02/2019   LDLDIRECT 109.0 10/25/2014   LDLCALC 102 (H) 05/02/2019   ALT 11 05/02/2019   AST 13 05/02/2019   NA 138 05/02/2019   K 4.2 05/02/2019   CL 105 05/02/2019   CREATININE 0.81 05/02/2019   BUN 13 05/02/2019   CO2 27 05/02/2019   TSH 1.89 05/02/2019   MICROALBUR 0.5 08/30/2012    MM 3D SCREEN BREAST BILATERAL  Result Date: 10/19/2018 CLINICAL DATA:  Screening. EXAM: DIGITAL SCREENING BILATERAL  MAMMOGRAM WITH TOMO AND CAD COMPARISON:  Previous exam(s). ACR Breast Density Category b: There are scattered areas of fibroglandular density. FINDINGS: There are no findings suspicious for malignancy. Images were processed with CAD. IMPRESSION: No mammographic evidence of malignancy. A result letter of this screening mammogram will be mailed directly to the patient. RECOMMENDATION: Screening mammogram in one year. (Code:SM-B-01Y) BI-RADS CATEGORY  1: Negative. Electronically Signed   By: Marin Olp M.D.   On: 10/19/2018 12:32    Assessment & Plan:   Problem List Items Addressed This Visit      Unprioritized   Hyperlipidemia    managed with simvastatin. LDL and triglycerides are at goal on current medications. She has no side effects and liver enzymes are due for recheck in April . No changes today  Lab Results  Component Value Date   CHOL 170 05/02/2019   HDL 51.10 05/02/2019   LDLCALC 102 (H) 05/02/2019   LDLDIRECT 109.0 10/25/2014  TRIG 81.0 05/02/2019   CHOLHDL 3 05/02/2019   Lab Results  Component Value Date   ALT 11 05/02/2019   AST 13 05/02/2019   ALKPHOS 66 05/02/2019   BILITOT 0.8 05/02/2019         Relevant Medications   simvastatin (ZOCOR) 20 MG tablet   Hypertension    Well controlled on current regimen. Renal function stable, no changes today.  Advised to check bp weekly at home       Relevant Medications   simvastatin (ZOCOR) 20 MG tablet   Cough    Recurrent every winter without infectious symptoms.  Resolves with Spring.  Advised to examine air ducts,  Filters,  And schedule a visit during next occurrence.  Refilling proair and tessalon      Chronic insomnia    Managed with 2.5 mg ambien and trazodone.  I have reviewed the dangers of prescribing this drug to the elderly with patient.  She has been [place] dependent for over two years, and prior attempts to reduce dose have been unsuccessful resulting in loss of sleep..      Osteoporosis    Continues  to refuse therapy.  No FH of osteoporosis .  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 I have also changed her albuterol. Additionally, I am having her maintain her Caltrate 600+D Plus Minerals, diphenhydrAMINE, Diclofenac Sodium, benzonatate, traZODone, acyclovir, losartan, estradiol, simvastatin, and zolpidem.  Meds ordered this encounter  Medications  . albuterol (VENTOLIN HFA) 108 (90 Base) MCG/ACT inhaler    Sig: Take 2 puffs 2 times per day for 5 days, and may use every 6 hours as needed. After 5 days, use as needed    Dispense:  18 g    Refill:  2  . estradiol (ESTRACE VAGINAL) 0.1 MG/GM vaginal cream    Sig: Place 1 Applicatorful vaginally 3 (three) times a week.    Dispense:  42.5 g    Refill:  2  . simvastatin (ZOCOR) 20 MG tablet    Sig: Take 1 tablet (20 mg total) by mouth every other day.    Dispense:  90 tablet    Refill:  3  . zolpidem (AMBIEN) 5 MG tablet    Sig: Take 1 tablet (5 mg total) by mouth at bedtime as needed for sleep.    Dispense:  90 tablet    Refill:  1    Medications Discontinued During This Encounter  Medication Reason  . acyclovir ointment (ZOVIRAX) 5 % Prescription never filled  . estradiol (ESTRACE VAGINAL) 0.1 MG/GM vaginal cream Reorder  . albuterol (PROVENTIL HFA;VENTOLIN HFA) 108 (90 Base) MCG/ACT inhaler Reorder  . simvastatin (ZOCOR) 20 MG tablet Reorder  . zolpidem (AMBIEN) 5 MG tablet Reorder    Follow-up: No follow-ups on file.   Crecencio Mc, MD

## 2019-05-05 NOTE — Assessment & Plan Note (Signed)
Well controlled on current regimen. Renal function stable, no changes today.  Advised to check bp weekly at home

## 2019-05-05 NOTE — Assessment & Plan Note (Signed)
managed with simvastatin. LDL and triglycerides are at goal on current medications. She has no side effects and liver enzymes are due for recheck in April . No changes today  Lab Results  Component Value Date   CHOL 170 05/02/2019   HDL 51.10 05/02/2019   LDLCALC 102 (H) 05/02/2019   LDLDIRECT 109.0 10/25/2014   TRIG 81.0 05/02/2019   CHOLHDL 3 05/02/2019   Lab Results  Component Value Date   ALT 11 05/02/2019   AST 13 05/02/2019   ALKPHOS 66 05/02/2019   BILITOT 0.8 05/02/2019

## 2019-05-05 NOTE — Patient Instructions (Addendum)
[email or web address]    Your vitamin D is borderline low.  please start taking 1000 [place] daily.    Check BP once a week . Goal is 120/70 to 130/80  Your annual mammogram has been ordered and is due in late September .  You are encouraged (required) to call to make your appointment at [place] [phone]

## 2019-05-05 NOTE — Assessment & Plan Note (Signed)
Continues to refuse therapy.  No FH of osteoporosis .  Physically active,  Takes calcium/D

## 2019-05-05 NOTE — Assessment & Plan Note (Signed)
Recurrent every winter without infectious symptoms.  Resolves with Spring.  Advised to examine air ducts,  Filters,  And schedule a visit during next occurrence.  Refilling proair and tessalon

## 2019-05-05 NOTE — Assessment & Plan Note (Signed)
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

## 2019-05-05 NOTE — Assessment & Plan Note (Signed)
Managed with 2.5 mg ambien and trazodone.  I have reviewed the dangers of prescribing this drug to the elderly with patient.  She has been [place] dependent for over two years, and prior attempts to reduce dose have been unsuccessful resulting in loss of sleep.Marland Kitchen

## 2019-05-10 ENCOUNTER — Other Ambulatory Visit: Payer: Self-pay

## 2019-05-10 DIAGNOSIS — R059 Cough, unspecified: Secondary | ICD-10-CM

## 2019-05-10 DIAGNOSIS — R05 Cough: Secondary | ICD-10-CM

## 2019-05-10 MED ORDER — ALBUTEROL SULFATE HFA 108 (90 BASE) MCG/ACT IN AERS: 2.0000 | INHALATION_SPRAY | Freq: Four times a day (QID) | RESPIRATORY_TRACT | 2 refills | Status: DC | PRN

## 2019-07-05 ENCOUNTER — Ambulatory Visit: Payer: Medicare HMO | Admitting: Internal Medicine

## 2019-07-05 ENCOUNTER — Other Ambulatory Visit: Payer: Self-pay

## 2019-07-05 ENCOUNTER — Encounter: Payer: Self-pay | Admitting: Internal Medicine

## 2019-07-05 DIAGNOSIS — M778 Other enthesopathies, not elsewhere classified: Secondary | ICD-10-CM | POA: Diagnosis not present

## 2019-07-05 MED ORDER — CELECOXIB 200 MG PO CAPS: ORAL_CAPSULE | ORAL | 0 refills | Status: DC

## 2019-07-05 MED ORDER — DICLOFENAC SODIUM 1 % EX GEL: 4.0000 g | Freq: Four times a day (QID) | CUTANEOUS | 2 refills | Status: DC

## 2019-07-05 NOTE — Progress Notes (Signed)
Subjective:  Patient ID: Amber Sullivan, female    DOB: 14-Mar-1955  Age: 64 y.o. MRN: [id number]  CC: The encounter diagnosis was Tendonitis of elbow, right.  HPI [name] presents for evaluation of sore elbow  This visit occurred during the SARS-CoV-2 public health emergency.  Safety protocols were in place, including screening questions prior to the visit, additional usage of staff PPE, and extensive cleaning of exam room while observing appropriate contact time as indicated for disinfecting solutions.    Patient has received both doses of the available COVID 19 vaccine without complications.  Patient continues to mask when outside of the home except when walking in yard or at safe distances from others .  Patient denies any change in mood or development of unhealthy behaviors resuting from the pandemic's restriction of activities and socialization.    Symptoms involving right elbow started after spending an entire day helping a friend tend her overgrown yard.  Used a lopper for several hours. The pain became severe and was brought on by lifting a pail or purse, and bending elbow to eat.  Took a 3 day rest from activities and pain resolved during rest  but pain returned when she resumed yardwork. Typically does an hour or two of yardwork    Outpatient Medications Prior to Visit  Medication Sig Dispense Refill  . acyclovir (ZOVIRAX) 400 MG tablet Take 1 tablet (400 mg total) by mouth 5 (five) times daily as needed (shingles outbreak). 35 tablet 1  . albuterol (VENTOLIN HFA) 108 (90 Base) MCG/ACT inhaler Inhale 2 puffs into the lungs every 6 (six) hours as needed for wheezing or shortness of breath. 18 g 2  . Calcium Carbonate-Vit D-Min (CALTRATE 600+D PLUS MINERALS) 600-800 MG-UNIT TABS Take 1 tablet by mouth daily. Taking 1 tablet every other day    . diphenhydrAMINE (BENADRYL) 25 MG tablet Take 25 mg by mouth daily.    Marland Kitchen estradiol (ESTRACE VAGINAL) 0.1 MG/GM vaginal cream  Place 1 Applicatorful vaginally 3 (three) times a week. 42.5 g 2  . losartan (COZAAR) 100 MG tablet TAKE 1 TABLET DAILY 90 tablet 3  . simvastatin (ZOCOR) 20 MG tablet Take 1 tablet (20 mg total) by mouth every other day. 90 tablet 3  . traZODone (DESYREL) 50 MG tablet Take 0.5-1 tablets (25-50 mg total) by mouth at bedtime as needed for sleep. 90 tablet 0  . zolpidem (AMBIEN) 5 MG tablet Take 1 tablet (5 mg total) by mouth at bedtime as needed for sleep. 90 tablet 1  . Diclofenac Sodium (PENNSAID) 2 % SOLN Place 2 application onto the skin 2 (two) times daily as needed. 112 g 6  . benzonatate (TESSALON) 200 MG capsule Take 1 capsule (200 mg total) by mouth 3 (three) times daily as needed for cough. (Patient not taking: Reported on 07/05/2019) 60 capsule 1   No facility-administered medications prior to visit.    Review of Systems;  Patient denies headache, fevers, malaise, unintentional weight loss, skin rash, eye pain, sinus congestion and sinus pain, sore throat, dysphagia,  hemoptysis , cough, dyspnea, wheezing, chest pain, palpitations, orthopnea, edema, abdominal pain, nausea, melena, diarrhea, constipation, flank pain, dysuria, hematuria, urinary  Frequency, nocturia, numbness, tingling, seizures,  Focal weakness, Loss of consciousness,  Tremor, insomnia, depression, anxiety, and suicidal ideation.      Objective:  BP 120/80 (BP Location: Left Arm, Patient Position: Sitting, Cuff Size: Normal)   Pulse 84   Temp 98.7 F (37.1 C) (Temporal)  Resp 14   Ht 5\' 5"  (1.651 m)   Wt 149 lb 6.4 oz (67.8 kg)   SpO2 98%   BMI 24.86 kg/m   BP Readings from Last 3 Encounters:  07/05/19 120/80  05/05/19 124/68  03/21/19 126/66    Wt Readings from Last 3 Encounters:  07/05/19 149 lb 6.4 oz (67.8 kg)  05/05/19 151 lb (68.5 kg)  03/21/19 149 lb 12.8 oz (67.9 kg)    General appearance: alert, cooperative and appears stated age Ears: normal TM's and external ear canals both ears Throat:  lips, mucosa, and tongue normal; teeth and gums normal Neck: no adenopathy, no carotid bruit, supple, symmetrical, trachea midline and thyroid not enlarged, symmetric, no tenderness/mass/nodules Back: symmetric, no curvature. ROM normal. No CVA tenderness. Lungs: clear to auscultation bilaterally Heart: regular rate and rhythm, S1, S2 normal, no murmur, click, rub or gallop Abdomen: soft, non-tender; bowel sounds normal; no masses,  no organomegaly Pulses: 2+ and symmetric Skin: Skin color, texture, turgor normal. No rashes or lesions MSK:  Right elbow:  No pain  with forced supination or pronation..  No palpable effusions or tenderness,  No redness  Lymph nodes: Cervical, supraclavicular, epitrochlear and axillary nodes normal.  No results found for: HGBA1C  Lab Results  Component Value Date   CREATININE 0.81 05/02/2019   CREATININE 0.87 11/03/2018   CREATININE 0.76 10/19/2017    Lab Results  Component Value Date   WBC 6.3 11/19/2015   HGB 13.6 11/19/2015   HCT 40.7 11/19/2015   PLT 208.0 11/19/2015   GLUCOSE 93 05/02/2019   CHOL 170 05/02/2019   TRIG 81.0 05/02/2019   HDL 51.10 05/02/2019   LDLDIRECT 109.0 10/25/2014   LDLCALC 102 (H) 05/02/2019   ALT 11 05/02/2019   AST 13 05/02/2019   NA 138 05/02/2019   K 4.2 05/02/2019   CL 105 05/02/2019   CREATININE 0.81 05/02/2019   BUN 13 05/02/2019   CO2 27 05/02/2019   TSH 1.89 05/02/2019   MICROALBUR 0.5 08/30/2012    MM 3D SCREEN BREAST BILATERAL  Result Date: 10/19/2018 CLINICAL DATA:  Screening. EXAM: DIGITAL SCREENING BILATERAL MAMMOGRAM WITH TOMO AND CAD COMPARISON:  Previous exam(s). ACR Breast Density Category b: There are scattered areas of fibroglandular density. FINDINGS: There are no findings suspicious for malignancy. Images were processed with CAD. IMPRESSION: No mammographic evidence of malignancy. A result letter of this screening mammogram will be mailed directly to the patient. RECOMMENDATION: Screening  mammogram in one year. (Code:SM-B-01Y) BI-RADS CATEGORY  1: Negative. Electronically Signed   By: Marin Olp M.D.   On: 10/19/2018 12:32    Assessment & Plan:   Problem List Items Addressed This Visit      Unprioritized   Tendonitis of elbow, right    Secondary to overuse of garden tools (lopper).affecting the extensor carpi digitorum. Marland Kitchen advised to abstain from use  For 3 weeks while using NSAIDS and ice.         I provided  30 minutes of  face-to-face time during this encounter reviewing patient's current problems and past surgeries, labs and imaging studies, providing counseling on the above mentioned problems , and coordination  of care .  I have discontinued Izora Gala L. Greenhalgh's Diclofenac Sodium. I am also having her start on diclofenac Sodium and celecoxib. Additionally, I am having her maintain her Caltrate 600+D Plus Minerals, diphenhydrAMINE, benzonatate, traZODone, acyclovir, losartan, estradiol, simvastatin, zolpidem, and albuterol.  Meds ordered this encounter  Medications  . diclofenac Sodium (VOLTAREN)  1 % GEL    Sig: Apply 4 g topically 4 (four) times daily.    Dispense:  100 g    Refill:  2  . celecoxib (CELEBREX) 200 MG capsule    Sig: 2 times daily for one week,  Then once daily thereafter    Dispense:  60 capsule    Refill:  0    Medications Discontinued During This Encounter  Medication Reason  . Diclofenac Sodium (PENNSAID) 2 % SOLN     Follow-up: No follow-ups on file.   Crecencio Mc, MD

## 2019-07-05 NOTE — Patient Instructions (Addendum)
You have tendonitis from garden work   Start the celebrex twice daily for one week (stop motrin )  Continue tylenol as needed for pain  (up to 2000 mg daily ok)   After one week,  Reduce celebrex dose to once daily and start using voltaren gel as well   Ice for 15 minutes every 4 hours  NO LIFTING ANYTHING !! FOR 3 WEEKS

## 2019-07-06 DIAGNOSIS — M778 Other enthesopathies, not elsewhere classified: Secondary | ICD-10-CM

## 2019-07-06 HISTORY — DX: Other enthesopathies, not elsewhere classified: M77.8

## 2019-07-06 NOTE — Assessment & Plan Note (Addendum)
Secondary to overuse of garden tools (lopper).affecting the extensor carpi digitorum. Marland Kitchen advised to abstain from use  For 3 weeks while using NSAIDS and ice.

## 2019-07-08 ENCOUNTER — Other Ambulatory Visit: Payer: Self-pay

## 2019-07-08 MED ORDER — TRAZODONE HCL 50 MG PO TABS: 25.0000 mg | ORAL_TABLET | Freq: Every evening | ORAL | 0 refills | Status: DC | PRN

## 2019-07-29 ENCOUNTER — Telehealth: Payer: Self-pay | Admitting: Internal Medicine

## 2019-07-29 NOTE — Telephone Encounter (Signed)
Noted. Changed in chart.

## 2019-07-29 NOTE — Telephone Encounter (Signed)
Pt called in stated that she is changing her mail order Agency to [facility]

## 2019-08-09 ENCOUNTER — Telehealth: Payer: Self-pay | Admitting: Internal Medicine

## 2019-08-09 MED ORDER — ZOLPIDEM TARTRATE 5 MG PO TABS: 5.0000 mg | ORAL_TABLET | Freq: Every evening | ORAL | 1 refills | Status: DC | PRN

## 2019-08-09 MED ORDER — SIMVASTATIN 20 MG PO TABS: 20.0000 mg | ORAL_TABLET | ORAL | 3 refills | Status: DC

## 2019-08-09 NOTE — Telephone Encounter (Signed)
Refilled: 05/05/2019 Last OV: 07/05/2019 Next OV: 11/04/2019  Please send to [facility] pt no longer uses [facility].

## 2019-08-09 NOTE — Telephone Encounter (Signed)
LMTCB

## 2019-08-09 NOTE — Telephone Encounter (Signed)
Pt called in stated that she need a new prescriptions for medication because she has a new place where she get her medication from she needs a call back

## 2019-08-18 ENCOUNTER — Telehealth: Payer: Self-pay

## 2019-08-18 NOTE — Telephone Encounter (Signed)
PA for Zolpidem has been submitted on covermymeds.

## 2019-10-17 ENCOUNTER — Telehealth: Payer: Self-pay | Admitting: Internal Medicine

## 2019-10-17 DIAGNOSIS — E782 Mixed hyperlipidemia: Secondary | ICD-10-CM

## 2019-10-17 DIAGNOSIS — I10 Essential (primary) hypertension: Secondary | ICD-10-CM

## 2019-10-17 DIAGNOSIS — R7301 Impaired fasting glucose: Secondary | ICD-10-CM

## 2019-10-17 NOTE — Telephone Encounter (Signed)
Patient called she wanted labs before her visit on the 15th of October and she wanted her A1C checked also

## 2019-10-18 ENCOUNTER — Other Ambulatory Visit: Payer: Self-pay

## 2019-10-18 ENCOUNTER — Ambulatory Visit
Admission: RE | Admit: 2019-10-18 | Discharge: 2019-10-18 | Disposition: A | Discharge status: Home / Self Care | Payer: Medicare HMO | Source: Ambulatory Visit | Attending: Internal Medicine | Admitting: Internal Medicine

## 2019-10-18 DIAGNOSIS — Z1231 Encounter for screening mammogram for malignant neoplasm of breast: Secondary | ICD-10-CM | POA: Diagnosis not present

## 2019-10-19 NOTE — Telephone Encounter (Signed)
Pt is scheduled for labs on 11/02/2019 so I have ordered CMP, lipid panel and A1c. Is there anything else that needs to be ordered?

## 2019-11-02 ENCOUNTER — Other Ambulatory Visit: Payer: Self-pay

## 2019-11-02 ENCOUNTER — Other Ambulatory Visit (INDEPENDENT_AMBULATORY_CARE_PROVIDER_SITE_OTHER): Payer: Medicare HMO

## 2019-11-02 DIAGNOSIS — I10 Essential (primary) hypertension: Secondary | ICD-10-CM

## 2019-11-02 DIAGNOSIS — R7301 Impaired fasting glucose: Secondary | ICD-10-CM | POA: Diagnosis not present

## 2019-11-02 DIAGNOSIS — E782 Mixed hyperlipidemia: Secondary | ICD-10-CM

## 2019-11-02 LAB — COMPREHENSIVE METABOLIC PANEL
ALT: 11 U/L (ref 0–35)
AST: 12 U/L (ref 0–37)
Albumin: 3.8 g/dL (ref 3.5–5.2)
Alkaline Phosphatase: 63 U/L (ref 39–117)
BUN: 12 mg/dL (ref 6–23)
CO2: 30 mEq/L (ref 19–32)
Calcium: 8.8 mg/dL (ref 8.4–10.5)
Chloride: 102 mEq/L (ref 96–112)
Creatinine, Ser: 0.76 mg/dL (ref 0.40–1.20)
GFR: 81.71 mL/min (ref >60.00)
Glucose, Bld: 88 mg/dL (ref 70–99)
Potassium: 4 mEq/L (ref 3.5–5.1)
Sodium: 138 mEq/L (ref 135–145)
Total Bilirubin: 0.9 mg/dL (ref 0.2–1.2)
Total Protein: 6.1 g/dL (ref 6.0–8.3)

## 2019-11-02 LAB — LIPID PANEL
Cholesterol: 191 mg/dL (ref 0–200)
HDL: 55.8 mg/dL (ref >39.00)
LDL Cholesterol: 115 mg/dL — ABNORMAL HIGH (ref 0–99)
NonHDL: 135.48
Total CHOL/HDL Ratio: 3
Triglycerides: 104 mg/dL (ref 0.0–149.0)
VLDL: 20.8 mg/dL (ref 0.0–40.0)

## 2019-11-02 LAB — HEMOGLOBIN A1C: Hgb A1c MFr Bld: 6 % (ref 4.6–6.5)

## 2019-11-03 NOTE — Progress Notes (Signed)
Attached are your recent fasting labs,  We will discuss in detail at your upcoming visit.  Regards,   Aquanetta Schwarz, MD    

## 2019-11-04 ENCOUNTER — Encounter: Payer: Self-pay | Admitting: Internal Medicine

## 2019-11-04 ENCOUNTER — Other Ambulatory Visit: Payer: Self-pay

## 2019-11-04 ENCOUNTER — Ambulatory Visit: Payer: Medicare HMO | Admitting: Internal Medicine

## 2019-11-04 VITALS — BP 130/56 | HR 90 | Temp 98.0°F | Resp 15 | Ht 65.0 in | Wt 147.0 lb

## 2019-11-04 DIAGNOSIS — K635 Polyp of colon: Secondary | ICD-10-CM | POA: Diagnosis not present

## 2019-11-04 DIAGNOSIS — M778 Other enthesopathies, not elsewhere classified: Secondary | ICD-10-CM

## 2019-11-04 DIAGNOSIS — Z23 Encounter for immunization: Secondary | ICD-10-CM

## 2019-11-04 DIAGNOSIS — Z0000 Encounter for general adult medical examination without abnormal findings: Secondary | ICD-10-CM

## 2019-11-04 DIAGNOSIS — I10 Essential (primary) hypertension: Secondary | ICD-10-CM

## 2019-11-04 DIAGNOSIS — E782 Mixed hyperlipidemia: Secondary | ICD-10-CM

## 2019-11-04 DIAGNOSIS — I700 Atherosclerosis of aorta: Secondary | ICD-10-CM | POA: Diagnosis not present

## 2019-11-04 DIAGNOSIS — R7303 Prediabetes: Secondary | ICD-10-CM | POA: Insufficient documentation

## 2019-11-04 MED ORDER — ROSUVASTATIN CALCIUM 10 MG PO TABS
10.0000 mg | ORAL_TABLET | Freq: Every day | ORAL | 1 refills | Status: DC
Start: 2019-11-04 — End: 2019-11-09

## 2019-11-04 MED ORDER — ROSUVASTATIN CALCIUM 10 MG PO TABS: 10.0000 mg | ORAL_TABLET | Freq: Every day | ORAL | 1 refills | Status: DC

## 2019-11-04 NOTE — Patient Instructions (Addendum)
I am changing your cholesterol medication from simvastatin to rosuvastatin 10 mg (more potent)   because of the aortic calcifications noted on a prior chest film  If you can take if every other day or daily ,  GREAT!  I recommend that you CONTINUE  Taking Aspirin once or twice per week   Continue low glycemic index/Mediterranean style diet and regular participation in exercise . These are the best deterrants to development of type 2 Diabetes    Health Maintenance After Age 28 After age 44, you are at a higher risk for certain long-term diseases and infections as well as injuries from falls. Falls are a major cause of broken bones and head injuries in people who are older than age 65. Getting regular preventive care can help to keep you healthy and well. Preventive care includes getting regular testing and making lifestyle changes as recommended by your health care provider. Talk with your health care provider about:  Which screenings and tests you should have. A screening is a test that checks for a disease when you have no symptoms.  A diet and exercise plan that is right for you. What should I know about screenings and tests to prevent falls? Screening and testing are the best ways to find a health problem early. Early diagnosis and treatment give you the best chance of managing medical conditions that are common after age 110. Certain conditions and lifestyle choices may make you more likely to have a fall. Your health care provider may recommend:  Regular vision checks. Poor vision and conditions such as cataracts can make you more likely to have a fall. If you wear glasses, make sure to get your prescription updated if your vision changes.  Medicine review. Work with your health care provider to regularly review all of the medicines you are taking, including over-the-counter medicines. Ask your health care provider about any side effects that may make you more likely to have a fall. Tell your  health care provider if any medicines that you take make you feel dizzy or sleepy.  Osteoporosis screening. Osteoporosis is a condition that causes the bones to get weaker. This can make the bones weak and cause them to break more easily.  Blood pressure screening. Blood pressure changes and medicines to control blood pressure can make you feel dizzy.  Strength and balance checks. Your health care provider may recommend certain tests to check your strength and balance while standing, walking, or changing positions.  Foot health exam. Foot pain and numbness, as well as not wearing proper footwear, can make you more likely to have a fall.  Depression screening. You may be more likely to have a fall if you have a fear of falling, feel emotionally low, or feel unable to do activities that you used to do.  Alcohol use screening. Using too much alcohol can affect your balance and may make you more likely to have a fall. What actions can I take to lower my risk of falls? General instructions  Talk with your health care provider about your risks for falling. Tell your health care provider if: ? You fall. Be sure to tell your health care provider about all falls, even ones that seem minor. ? You feel dizzy, sleepy, or off-balance.  Take over-the-counter and prescription medicines only as told by your health care provider. These include any supplements.  Eat a healthy diet and maintain a healthy weight. A healthy diet includes low-fat dairy products, low-fat (lean) meats, and  fiber from whole grains, beans, and lots of fruits and vegetables. Home safety  Remove any tripping hazards, such as rugs, cords, and clutter.  Install safety equipment such as grab bars in bathrooms and safety rails on stairs.  Keep rooms and walkways well-lit. Activity   Follow a regular exercise program to stay fit. This will help you maintain your balance. Ask your health care provider what types of exercise are  appropriate for you.  If you need a cane or walker, use it as recommended by your health care provider.  Wear supportive shoes that have nonskid soles. Lifestyle  Do not drink alcohol if your health care provider tells you not to drink.  If you drink alcohol, limit how much you have: ? 0-1 drink a day for women. ? 0-2 drinks a day for men.  Be aware of how much alcohol is in your drink. In the [place] one drink equals one typical bottle of beer (12 oz), one-half glass of wine (5 oz), or one shot of hard liquor (1 oz).  Do not use any products that contain nicotine or tobacco, such as cigarettes and e-cigarettes. If you need help quitting, ask your health care provider. Summary  Having a healthy lifestyle and getting preventive care can help to protect your health and wellness after age 56.  Screening and testing are the best way to find a health problem early and help you avoid having a fall. Early diagnosis and treatment give you the best chance for managing medical conditions that are more common for people who are older than age 51.  Falls are a major cause of broken bones and head injuries in people who are older than age 69. Take precautions to prevent a fall at home.  Work with your health care provider to learn what changes you can make to improve your health and wellness and to prevent falls. This information is not intended to replace advice given to you by your health care provider. Make sure you discuss any questions you have with your health care provider. Document Revised: 04/29/2018 Document Reviewed: 11/19/2016 Elsevier Patient Education  2020 [employer].

## 2019-11-04 NOTE — Assessment & Plan Note (Signed)
Well controlled on current regimen of losartan 100 mg daily.  Reminded to check once a month at home . Renal function stable, no changes today.

## 2019-11-04 NOTE — Assessment & Plan Note (Signed)
Suggested by a1c requested by patient. .  Fasting glucoses have been normal without fail.  She is following a mediterranean style diet and exercising regularly

## 2019-11-04 NOTE — Assessment & Plan Note (Signed)

## 2019-11-04 NOTE — Assessment & Plan Note (Signed)
Improved significantly.  No longer taking celebrex daily

## 2019-11-04 NOTE — Progress Notes (Signed)
Patient ID: Amber Sullivan, female    DOB: [birth year]  Age: 64 y.o. MRN: [id number]  The patient is here for annual preventive examination and management of other chronic and acute problems.  This visit occurred during the SARS-CoV-2 public health emergency.  Safety protocols were in place, including screening questions prior to the visit, additional usage of staff PPE, and extensive cleaning of exam room while observing appropriate contact time as indicated for disinfecting solutions.    Patient has received 3 doses of the available COVID 19 vaccine (Oct 11 booster dose)  without complications.  Patient continues to mask when outside of the home except when walking in yard or at safe distances from others .  Patient denies any change in mood or development of unhealthy behaviors resuting from the pandemic's restriction of activities and socialization.     The risk factors are reflected in the social history.  There are no preventive care reminders to display for this patient.  The roster of all physicians providing medical care to patient - is listed in the Snapshot section of the chart.  Activities of daily living:  The patient is 100% independent in all ADLs: dressing, toileting, feeding as well as independent mobility  Home safety : The patient has smoke detectors in the home. They wear seatbelts.  There are no firearms at home. There is no violence in the home.   There is no risks for hepatitis, STDs or HIV. There is no   history of blood transfusion. They have no travel history to infectious disease endemic areas of the world.  The patient has seen their dentist in the last six month. They have seen their eye doctor in the last year. They admit to slight hearing difficulty with regard to whispered voices and some television programs.  They have deferred audiologic testing in the last year.  They do not  have excessive sun exposure. Discussed the need for sun protection: hats, long  sleeves and use of sunscreen if there is significant sun exposure.   Diet: the importance of a healthy diet is discussed. They do have a healthy diet.  The benefits of regular aerobic exercise were discussed. She walks 4 times per week ,  20 minutes.   Depression screen: there are no signs or vegative symptoms of depression- irritability, change in appetite, anhedonia, sadness/tearfullness.  Cognitive assessment: the patient manages all their financial and personal affairs and is actively engaged. They could relate day,date,year and events; recalled 2/3 objects at 3 minutes; performed clock-face test normally.  The following portions of the patient's history were reviewed and updated as appropriate: allergies, current medications, past family history, past medical history,  past surgical history, past social history  and problem list.  Visual acuity was not assessed per patient preference since she has regular follow up with her ophthalmologist. Hearing and body mass index were assessed and reviewed.   During the course of the visit the patient was educated and counseled about appropriate screening and preventive services including : fall prevention , diabetes screening, nutrition counseling, colorectal cancer screening, and recommended immunizations.    CC: The primary encounter diagnosis was Need for vaccination against Streptococcus pneumoniae using pneumococcal conjugate vaccine 13. Diagnoses of Hyperplastic colonic polyp, unspecified part of colon, Tendonitis of elbow, right, Mixed hyperlipidemia, Aortic arch atherosclerosis ([place]), Primary hypertension, Prediabetes, and Encounter for preventive health examination were also pertinent to this visit.   Aortic atherosclerosis noted on chest x ray last year .  Change in  statin discussed. Discussed aspirin use ,   History Amber Sullivan has a past medical history of Fracture of lateral malleolus (2009), Fracture of lateral malleolus (01/21/2007),  Hyperlipidemia, Hypertension (2003), and Positive colorectal cancer screening using Cologuard test (04/20/2016).   She has a past surgical history that includes Cholecystectomy (2005); Squamous cell carcinoma excision (2007); Mohs surgery (2007); and Colonoscopy with propofol (N/A, 08/13/2016).   Her family history includes Diabetes in her mother; Heart attack in her father, maternal grandfather, and paternal grandfather; Heart attack (age of onset: 30) in her brother; Heart disease in her father, paternal grandfather, and paternal grandmother; Hyperlipidemia in her father; Hypertension in her father and mother; Stroke (age of onset: 30) in her mother.She reports that she has never smoked. She has never used smokeless tobacco. She reports current alcohol use. She reports that she does not use drugs.  Outpatient Medications Prior to Visit  Medication Sig Dispense Refill  . acyclovir (ZOVIRAX) 400 MG tablet Take 1 tablet (400 mg total) by mouth 5 (five) times daily as needed (shingles outbreak). 35 tablet 1  . albuterol (VENTOLIN HFA) 108 (90 Base) MCG/ACT inhaler Inhale 2 puffs into the lungs every 6 (six) hours as needed for wheezing or shortness of breath. 18 g 2  . Calcium Carbonate-Vit D-Min (CALTRATE 600+D PLUS MINERALS) 600-800 MG-UNIT TABS Take 1 tablet by mouth daily. Taking 1 tablet every other day    . diclofenac Sodium (VOLTAREN) 1 % GEL Apply 4 g topically 4 (four) times daily. 100 g 2  . diphenhydrAMINE (BENADRYL) 25 MG tablet Take 25 mg by mouth daily.    Marland Kitchen estradiol (ESTRACE VAGINAL) 0.1 MG/GM vaginal cream Place 1 Applicatorful vaginally 3 (three) times a week. 42.5 g 2  . losartan (COZAAR) 100 MG tablet TAKE 1 TABLET DAILY 90 tablet 3  . traZODone (DESYREL) 50 MG tablet Take 0.5-1 tablets (25-50 mg total) by mouth at bedtime as needed for sleep. 90 tablet 0  . zolpidem (AMBIEN) 5 MG tablet Take 1 tablet (5 mg total) by mouth at bedtime as needed for sleep. 90 tablet 1  . celecoxib  (CELEBREX) 200 MG capsule 2 times daily for one week,  Then once daily thereafter 60 capsule 0  . simvastatin (ZOCOR) 20 MG tablet Take 1 tablet (20 mg total) by mouth every other day. 90 tablet 3  . benzonatate (TESSALON) 200 MG capsule Take 1 capsule (200 mg total) by mouth 3 (three) times daily as needed for cough. (Patient not taking: Reported on 07/05/2019) 60 capsule 1   No facility-administered medications prior to visit.    Review of Systems   Patient denies headache, fevers, malaise, unintentional weight loss, skin rash, eye pain, sinus congestion and sinus pain, sore throat, dysphagia,  hemoptysis , cough, dyspnea, wheezing, chest pain, palpitations, orthopnea, edema, abdominal pain, nausea, melena, diarrhea, constipation, flank pain, dysuria, hematuria, urinary  Frequency, nocturia, numbness, tingling, seizures,  Focal weakness, Loss of consciousness,  Tremor, insomnia, depression, anxiety, and suicidal ideation.      Objective:  BP (!) 130/56 (BP Location: Left Arm, Patient Position: Sitting, Cuff Size: Normal)   Pulse 90   Temp 98 F (36.7 C) (Oral)   Resp 15   Ht 5\' 5"  (1.651 m)   Wt 147 lb (66.7 kg)   SpO2 97%   BMI 24.46 kg/m   Physical Exam  General appearance: alert, cooperative and appears stated age Ears: normal TM's and external ear canals both ears Throat: lips, mucosa, and tongue normal; teeth and  gums normal Neck: no adenopathy, no carotid bruit, supple, symmetrical, trachea midline and thyroid not enlarged, symmetric, no tenderness/mass/nodules Back: symmetric, no curvature. ROM normal. No CVA tenderness. Lungs: clear to auscultation bilaterally Heart: regular rate and rhythm, S1, S2 normal, no murmur, click, rub or gallop Abdomen: soft, non-tender; bowel sounds normal; no masses,  no organomegaly Pulses: 2+ and symmetric Skin: Skin color, texture, turgor normal. No rashes or lesions Lymph nodes: Cervical, supraclavicular, and axillary nodes  normal.  Assessment & Plan:   Problem List Items Addressed This Visit      Unprioritized   Aortic arch atherosclerosis ([place])    Suggested on chest film from 2020.  Reviewed today with patient .  Recommended that she continue use of baby aspirin for primary prevention   once /twice week and change to a higher potency statin  today       Relevant Medications   rosuvastatin (CRESTOR) 10 MG tablet   Encounter for preventive health examination    age appropriate education and counseling updated, referrals for preventative services and immunizations addressed, dietary and smoking counseling addressed, most recent labs reviewed.  I have personally reviewed and have noted:  1) the patient's medical and social history 2) The pt's use of alcohol, tobacco, and illicit drugs 3) The patient's current medications and supplements 4) Functional ability including ADL's, fall risk, home safety risk, hearing and visual impairment 5) Diet and physical activities 6) Evidence for depression or mood disorder 7) The patient's height, weight, and BMI have been recorded in the chart  I have made referrals, and provided counseling and education based on review of the above      Hyperlipidemia    Changing to rosuvastatin based on review  Of prior chest film noting aortic calcifications  Lab Results  Component Value Date   CHOL 191 11/02/2019   HDL 55.80 11/02/2019   LDLCALC 115 (H) 11/02/2019   LDLDIRECT 109.0 10/25/2014   TRIG 104.0 11/02/2019   CHOLHDL 3 11/02/2019         Relevant Medications   rosuvastatin (CRESTOR) 10 MG tablet   Hyperplastic colon polyp   Hypertension    Well controlled on current regimen of losartan 100 mg daily.  Reminded to check once a month at home . Renal function stable, no changes today.      Relevant Medications   rosuvastatin (CRESTOR) 10 MG tablet   Prediabetes    Suggested by a1c requested by patient. .  Fasting glucoses have been normal without fail.  She  is following a mediterranean style diet and exercising regularly      Tendonitis of elbow, right    Improved significantly.  No longer taking celebrex daily        Other Visit Diagnoses    Need for vaccination against Streptococcus pneumoniae using pneumococcal conjugate vaccine 13    -  Primary   Relevant Orders   Pneumococcal conjugate vaccine 13-valent IM (Completed)      I have discontinued Amber Sullivan's celecoxib and simvastatin. I am also having her maintain her Caltrate 600+D Plus Minerals, diphenhydrAMINE, benzonatate, acyclovir, losartan, estradiol, albuterol, diclofenac Sodium, traZODone, zolpidem, and rosuvastatin.  Meds ordered this encounter  Medications  . DISCONTD: rosuvastatin (CRESTOR) 10 MG tablet    Sig: Take 1 tablet (10 mg total) by mouth daily.    Dispense:  90 tablet    Refill:  1  . rosuvastatin (CRESTOR) 10 MG tablet    Sig: Take 1 tablet (10 mg total)  by mouth daily.    Dispense:  90 tablet    Refill:  1    Medications Discontinued During This Encounter  Medication Reason  . simvastatin (ZOCOR) 20 MG tablet   . celecoxib (CELEBREX) 200 MG capsule   . rosuvastatin (CRESTOR) 10 MG tablet     Follow-up: No follow-ups on file.   Crecencio Mc, MD

## 2019-11-04 NOTE — Assessment & Plan Note (Addendum)
Suggested on chest film from 2020.  Reviewed today with patient .  Recommended that she continue use of baby aspirin for primary prevention   once /twice week and change to a higher potency statin  today

## 2019-11-04 NOTE — Assessment & Plan Note (Addendum)
Changing to rosuvastatin based on review  Of prior chest film noting aortic calcifications  Lab Results  Component Value Date   CHOL 191 11/02/2019   HDL 55.80 11/02/2019   LDLCALC 115 (H) 11/02/2019   LDLDIRECT 109.0 10/25/2014   TRIG 104.0 11/02/2019   CHOLHDL 3 11/02/2019

## 2019-11-09 ENCOUNTER — Other Ambulatory Visit: Payer: Self-pay

## 2019-11-09 ENCOUNTER — Telehealth: Payer: Self-pay | Admitting: Internal Medicine

## 2019-11-09 MED ORDER — ROSUVASTATIN CALCIUM 10 MG PO TABS: 10.0000 mg | ORAL_TABLET | Freq: Every day | ORAL | 1 refills | Status: DC

## 2019-11-09 NOTE — Telephone Encounter (Signed)
Patient called in about her refill forrosuvastatin (CRESTOR) 10 MG tablet she wanted it to go  To [facility]

## 2019-11-15 ENCOUNTER — Telehealth: Payer: Self-pay | Admitting: Internal Medicine

## 2019-11-15 NOTE — Telephone Encounter (Signed)
"  Rejection Reason - Patient Declined - cx appt on 10/17/19- declined to reschedule" [facility] PA said on Nov 15, 2019 10:25 AM

## 2019-11-22 ENCOUNTER — Telehealth: Payer: Self-pay | Admitting: Internal Medicine

## 2019-11-22 NOTE — Telephone Encounter (Signed)
If she would like to be seen before she travels ,  I would keep the appt with Maudie Mercury or go to urgent Care.  If she would just  like a muscle relaxer I can call one in now

## 2019-11-22 NOTE — Telephone Encounter (Signed)
Spoke with pt and scheduled her for a virtual with Dr. Derrel Nip tomorrow.

## 2019-11-22 NOTE — Telephone Encounter (Signed)
Patient is getting ready to fly on Friday. She is having back spasms and would like to know what to do . At time of call Dr. Derrel Nip does not have any available appointments and there are none available office appointments until Thursday with [facility].

## 2019-11-23 ENCOUNTER — Other Ambulatory Visit: Payer: Self-pay

## 2019-11-23 ENCOUNTER — Telehealth (INDEPENDENT_AMBULATORY_CARE_PROVIDER_SITE_OTHER): Payer: Medicare HMO | Admitting: Internal Medicine

## 2019-11-23 ENCOUNTER — Encounter: Payer: Self-pay | Admitting: Internal Medicine

## 2019-11-23 DIAGNOSIS — M62830 Muscle spasm of back: Secondary | ICD-10-CM | POA: Diagnosis not present

## 2019-11-23 MED ORDER — TIZANIDINE HCL 2 MG PO CAPS: 2.0000 mg | ORAL_CAPSULE | Freq: Three times a day (TID) | ORAL | 1 refills | Status: DC

## 2019-11-23 MED ORDER — TRAMADOL HCL 50 MG PO TABS
50.0000 mg | ORAL_TABLET | Freq: Three times a day (TID) | ORAL | 0 refills | Status: AC | PRN
Start: 2019-11-23 — End: 2019-11-28

## 2019-11-23 NOTE — Progress Notes (Signed)
Virtual Visit via [place]  This visit type was conducted due to national recommendations for restrictions regarding the COVID-19 pandemic (e.g. social distancing).  This format is felt to be most appropriate for this patient at this time.  All issues noted in this document were discussed and addressed.  No physical exam was performed (except for noted visual exam findings with Video Visits).   I connected with@ on 11/23/19 at  2:30 PM EDT by a video enabled telemedicine application and verified that I am speaking with the correct person using two identifiers. Location patient: home Location provider: work or home office Persons participating in the virtual visit: patient, provider  I discussed the limitations, risks, security and privacy concerns of performing an evaluation and management service by telephone and the availability of in person appointments. I also discussed with the patient that there may be a patient responsible charge related to this service. The patient expressed understanding and agreed to proceed.   Reason for visit: back pain  HPI:  64 yr old female with history of osteoporosis  Untreated by preference  ( T score spine -2.5 in 2016) presents with back pain on the advent of an out of town trip.  The back pain occurred after sleeping on a soft mattress during a recent trip, followed by several hours of driving.  The pain is not centered over her spine , and does not increase with deep breathing.  She describes it as a muscle spasm on both sides of lower spine,  R > L  Has been applying ice packs and using celebrex.  She is scheduled to leave town on Friday for a week long piano camp and is quite concerned that her activities will be hindered by her current situation.     ROS: Patient denies headache, fevers, malaise, unintentional weight loss, skin rash, eye pain, sinus congestion and sinus pain, sore throat, dysphagia,  hemoptysis , cough, dyspnea, wheezing, chest pain,  palpitations, orthopnea, edema, abdominal pain, nausea, melena, diarrhea, constipation, flank pain, dysuria, hematuria, urinary  Frequency, nocturia, numbness, tingling, seizures,  Focal weakness, Loss of consciousness,  Tremor, insomnia, depression, anxiety, and suicidal ideation.      Past Medical History:  Diagnosis Date  . Fracture of lateral malleolus 2009  . Fracture of lateral malleolus 01/21/2007  . Hyperlipidemia   . Hypertension 2003  . Positive colorectal cancer screening using Cologuard test 04/20/2016    Past Surgical History:  Procedure Laterality Date  . CHOLECYSTECTOMY  2005  . COLONOSCOPY WITH PROPOFOL N/A 08/13/2016   Procedure: COLONOSCOPY WITH PROPOFOL;  Surgeon: Robert Bellow, MD;  Location: [facility] ENDOSCOPY;  Service: Endoscopy;  Laterality: N/A;  . MOHS SURGERY  2007   squamou cell carcinoma rt nasolabial fold  . SQUAMOUS CELL CARCINOMA EXCISION  2007   mole on face    Family History  Problem Relation Age of Onset  . Hypertension Mother   . Diabetes Mother        type 2  . Stroke Mother 38       massive, with aphasia and paraplegia  . Hypertension Father   . Hyperlipidemia Father   . Heart attack Father        vs PE during hospitalization for chest pain   . Heart disease Father   . Heart attack Maternal Grandfather   . Heart disease Paternal Grandmother        CHF  . Heart disease Paternal Grandfather   . Heart attack Paternal Grandfather   .  Heart attack Brother 50  . Breast cancer Neg Hx     SOCIAL HX:  reports that she has never smoked. She has never used smokeless tobacco. She reports current alcohol use. She reports that she does not use drugs.   Current Outpatient Medications:  .  acyclovir (ZOVIRAX) 400 MG tablet, Take 1 tablet (400 mg total) by mouth 5 (five) times daily as needed (shingles outbreak)., Disp: 35 tablet, Rfl: 1 .  albuterol (VENTOLIN HFA) 108 (90 Base) MCG/ACT inhaler, Inhale 2 puffs into the lungs every 6 (six) hours as  needed for wheezing or shortness of breath., Disp: 18 g, Rfl: 2 .  Calcium Carbonate-Vit D-Min (CALTRATE 600+D PLUS MINERALS) 600-800 MG-UNIT TABS, Take 1 tablet by mouth daily. Taking 1 tablet every other day, Disp: , Rfl:  .  diclofenac Sodium (VOLTAREN) 1 % GEL, Apply 4 g topically 4 (four) times daily., Disp: 100 g, Rfl: 2 .  diphenhydrAMINE (BENADRYL) 25 MG tablet, Take 25 mg by mouth daily., Disp: , Rfl:  .  estradiol (ESTRACE VAGINAL) 0.1 MG/GM vaginal cream, Place 1 Applicatorful vaginally 3 (three) times a week., Disp: 42.5 g, Rfl: 2 .  losartan (COZAAR) 100 MG tablet, TAKE 1 TABLET DAILY, Disp: 90 tablet, Rfl: 3 .  traZODone (DESYREL) 50 MG tablet, Take 0.5-1 tablets (25-50 mg total) by mouth at bedtime as needed for sleep., Disp: 90 tablet, Rfl: 0 .  zolpidem (AMBIEN) 5 MG tablet, Take 1 tablet (5 mg total) by mouth at bedtime as needed for sleep., Disp: 90 tablet, Rfl: 1 .  benzonatate (TESSALON) 200 MG capsule, Take 1 capsule (200 mg total) by mouth 3 (three) times daily as needed for cough. (Patient not taking: Reported on 07/05/2019), Disp: 60 capsule, Rfl: 1 .  rosuvastatin (CRESTOR) 10 MG tablet, Take 1 tablet (10 mg total) by mouth daily. (Patient not taking: Reported on 11/23/2019), Disp: 90 tablet, Rfl: 1 .  tizanidine (ZANAFLEX) 2 MG capsule, Take 1 capsule (2 mg total) by mouth 3 (three) times daily., Disp: 60 capsule, Rfl: 1 .  traMADol (ULTRAM) 50 MG tablet, Take 1 tablet (50 mg total) by mouth every 8 (eight) hours as needed for up to 5 days., Disp: 15 tablet, Rfl: 0  EXAM:  VITALS per patient if applicable:  GENERAL: alert, oriented, appears well and in no acute distress  HEENT: atraumatic, conjunttiva clear, no obvious abnormalities on inspection of external nose and ears  NECK: normal movements of the head and neck  LUNGS: on inspection no signs of respiratory distress, breathing rate appears normal, no obvious gross SOB, gasping or wheezing  CV: no obvious  cyanosis  MS: moves all visible extremities without noticeable abnormality  PSYCH/NEURO: pleasant and cooperative, no obvious depression or anxiety, speech and thought processing grossly intact  ASSESSMENT AND PLAN:  Discussed the following assessment and plan:  Spasm of muscle of lower back  Spasm of muscle of lower back Advised to increase celebrex to bid for one week,  Add tylenol 4000 mg maximum daily dose for one week, and tizanidine for spasm.   rx for tramadol given for pain not relieved by the triad of NSAID tylenol and tizanidine. Follow up 1-2 weeks if no resolution     I discussed the assessment and treatment plan with the patient. The patient was provided an opportunity to ask questions and all were answered. The patient agreed with the plan and demonstrated an understanding of the instructions.   The patient was advised to call back  or seek an in-person evaluation if the symptoms worsen or if the condition fails to improve as anticipated.  I provided 20 minutes of face-to-face time during this encounter.   Crecencio Mc, MD

## 2019-11-23 NOTE — Patient Instructions (Addendum)
Increase your celebrex to 200 mg every 12 hours for 7 days,, then reduce dose to once daily thereafter  Max out the tylenol to 4000 mg daily in divided doses for 7 days, then reduce dose to 2000 mg daily thereafter if needed for pain   Stop diclofenac gel.  Get SalonPas patch with lidocaine and use as directed on box.    Tizanidine is your muscle relaxer.  It can be taken as 2 or 4 mg every 8 hours,  But it may make you drowsy  Finally, you can add  the tramadol as additional pain control  if the celebrex/tylenol combination is still not controlling your pain .  The dose is 50 mg every 8 hours if needed

## 2019-11-24 DIAGNOSIS — B0229 Other postherpetic nervous system involvement: Secondary | ICD-10-CM | POA: Insufficient documentation

## 2019-11-24 DIAGNOSIS — M62830 Muscle spasm of back: Secondary | ICD-10-CM | POA: Insufficient documentation

## 2019-11-24 HISTORY — DX: Other postherpetic nervous system involvement: B02.29

## 2019-11-24 NOTE — Assessment & Plan Note (Signed)
Advised to increase celebrex to bid for one week,  Add tylenol 4000 mg maximum daily dose for one week, and tizanidine for spasm.   rx for tramadol given for pain not relieved by the triad of NSAID tylenol and tizanidine. Follow up 1-2 weeks if no resolution

## 2019-11-26 ENCOUNTER — Other Ambulatory Visit: Payer: Self-pay

## 2019-11-26 ENCOUNTER — Ambulatory Visit
Admission: RE | Admit: 2019-11-26 | Discharge: 2019-11-26 | Disposition: A | Discharge status: Home / Self Care | Payer: Medicare HMO | Source: Ambulatory Visit | Attending: Family Medicine | Admitting: Family Medicine

## 2019-11-26 VITALS — BP 146/71 | HR 76 | Temp 98.5°F | Resp 14 | Ht 65.0 in | Wt 145.0 lb

## 2019-11-26 DIAGNOSIS — B029 Zoster without complications: Secondary | ICD-10-CM

## 2019-11-26 MED ORDER — PREDNISONE 10 MG PO TABS: ORAL_TABLET | ORAL | 0 refills | Status: DC

## 2019-11-26 MED ORDER — VALACYCLOVIR HCL 1 G PO TABS: 1000.0000 mg | ORAL_TABLET | Freq: Three times a day (TID) | ORAL | 0 refills | Status: AC

## 2019-11-26 NOTE — ED Provider Notes (Signed)
[facility]    CSN: [id number] Arrival date & time: 11/26/19  0936      History   Chief Complaint Chief Complaint  Patient presents with  . Rash   HPI  64 year old female presents with rash.  Patient reports that this week she has had ongoing low back pain.  She has been treated by her provider and has had minimal improvement with NSAIDs and muscle relaxants.  She states that today she developed a rash.  Rash is on the left side of her low back and goes around to the front of the abdomen/pelvic region.  She reports that her pain is 8/10 in severity.  Patient believes that she may have shingles.  No radicular symptoms.  Patient also notes that she has just not felt well.  She is had difficulty sleeping secondary to the back pain.    Past Medical History:  Diagnosis Date  . Fracture of lateral malleolus 2009  . Fracture of lateral malleolus 01/21/2007  . Hyperlipidemia   . Hypertension 2003  . Positive colorectal cancer screening using Cologuard test 04/20/2016    Patient Active Problem List   Diagnosis Date Noted  . Spasm of muscle of lower back 11/24/2019  . Hyperplastic colon polyp 11/04/2019  . Aortic arch atherosclerosis ([place]) 11/04/2019  . Prediabetes 11/04/2019  . Tendonitis of elbow, right 07/06/2019  . Neoplasm of cheek 04/10/2019  . Screening for breast cancer 04/29/2018  . Travel advice encounter 10/25/2017  . Positive colorectal cancer screening using DNA-based stool test 04/28/2016  . [place] arthritis 04/16/2015  . BPPV (benign paroxysmal positional vertigo) 11/13/2014  . Chronic insomnia 10/27/2014  . Osteoporosis 10/27/2014  . History of abnormal cervical Pap smear 03/30/2014  . Cough 02/17/2014  . Long-term use of high-risk medication 01/30/2014  . Encounter for preventive health examination 10/13/2012  . History of Mohs surgery for squamous cell carcinoma in situ of skin 01/28/2012  . Screening for colon cancer 01/28/2012  . Hyperlipidemia   .  Hypertension     Past Surgical History:  Procedure Laterality Date  . CHOLECYSTECTOMY  2005  . COLONOSCOPY WITH PROPOFOL N/A 08/13/2016   Procedure: COLONOSCOPY WITH PROPOFOL;  Surgeon: Robert Bellow, MD;  Location: [facility] ENDOSCOPY;  Service: Endoscopy;  Laterality: N/A;  . MOHS SURGERY  2007   squamou cell carcinoma rt nasolabial fold  . SQUAMOUS CELL CARCINOMA EXCISION  2007   mole on face    OB History   No obstetric history on file.      Home Medications    Prior to Admission medications   Medication Sig Start Date End Date Taking? Authorizing Provider  albuterol (VENTOLIN HFA) 108 (90 Base) MCG/ACT inhaler Inhale 2 puffs into the lungs every 6 (six) hours as needed for wheezing or shortness of breath. 05/10/19  Yes Crecencio Mc, MD  Calcium Carbonate-Vit D-Min (CALTRATE 600+D PLUS MINERALS) 600-800 MG-UNIT TABS Take 1 tablet by mouth daily. Taking 1 tablet every other day   Yes [provider]  celecoxib (CELEBREX) 200 MG capsule Take 200 mg by mouth 2 (two) times daily.   Yes [provider]  estradiol (ESTRACE VAGINAL) 0.1 MG/GM vaginal cream Place 1 Applicatorful vaginally 3 (three) times a week. 05/06/19  Yes Crecencio Mc, MD  losartan (COZAAR) 100 MG tablet TAKE 1 TABLET DAILY 01/03/19  Yes Crecencio Mc, MD  rosuvastatin (CRESTOR) 10 MG tablet Take 1 tablet (10 mg total) by mouth daily. 11/09/19  Yes Crecencio Mc,  MD  tizanidine (ZANAFLEX) 2 MG capsule Take 1 capsule (2 mg total) by mouth 3 (three) times daily. 11/23/19  Yes Crecencio Mc, MD  traMADol (ULTRAM) 50 MG tablet Take 1 tablet (50 mg total) by mouth every 8 (eight) hours as needed for up to 5 days. 11/23/19 11/28/19 Yes Crecencio Mc, MD  traZODone (DESYREL) 50 MG tablet Take 0.5-1 tablets (25-50 mg total) by mouth at bedtime as needed for sleep. 07/08/19  Yes Crecencio Mc, MD  zolpidem (AMBIEN) 5 MG tablet Take 1 tablet (5 mg total) by mouth at bedtime as needed for sleep.  08/09/19  Yes Crecencio Mc, MD  diclofenac Sodium (VOLTAREN) 1 % GEL Apply 4 g topically 4 (four) times daily. 07/05/19   Crecencio Mc, MD  diphenhydrAMINE (BENADRYL) 25 MG tablet Take 25 mg by mouth daily.    [provider]  predniSONE (DELTASONE) 10 MG tablet 50 mg daily x 2 days, then 40 mg daily x 2 days, then 30 mg daily x 2 days, then 20 mg daily x 2 days, then 10 mg daily x 2 days. 11/26/19   Coral Spikes, DO  valACYclovir (VALTREX) 1000 MG tablet Take 1 tablet (1,000 mg total) by mouth 3 (three) times daily for 10 days. 11/26/19 12/06/19  Coral Spikes, DO    Family History Family History  Problem Relation Age of Onset  . Hypertension Mother   . Diabetes Mother        type 2  . Stroke Mother 73       massive, with aphasia and paraplegia  . Hypertension Father   . Hyperlipidemia Father   . Heart attack Father        vs PE during hospitalization for chest pain   . Heart disease Father   . Heart attack Maternal Grandfather   . Heart disease Paternal Grandmother        CHF  . Heart disease Paternal Grandfather   . Heart attack Paternal Grandfather   . Heart attack Brother 1  . Breast cancer Neg Hx     Social History Social History   Tobacco Use  . Smoking status: Never Smoker  . Smokeless tobacco: Never Used  Vaping Use  . Vaping Use: Never used  Substance Use Topics  . Alcohol use: Yes    Comment: not much  . Drug use: No     Allergies   Ace inhibitors   Review of Systems Review of Systems  Musculoskeletal: Positive for back pain.  Skin: Positive for rash.  Psychiatric/Behavioral: Positive for sleep disturbance.   Physical Exam Triage Vital Signs ED Triage Vitals  Enc Vitals Group     BP 11/26/19 0950 (!) 146/71     Pulse Rate 11/26/19 0950 76     Resp 11/26/19 0950 14     Temp 11/26/19 0950 98.5 F (36.9 C)     Temp Source 11/26/19 0950 Oral     SpO2 11/26/19 0950 100 %     Weight 11/26/19 0946 145 lb (65.8 kg)     Height 11/26/19  0946 5\' 5"  (1.651 m)     Head Circumference --      Peak Flow --      Pain Score 11/26/19 0946 8     Pain Loc --      Pain Edu? --      Excl. in [place]? --    Updated Vital Signs BP (!) 146/71 (BP Location: Left Arm)  Pulse 76   Temp 98.5 F (36.9 C) (Oral)   Resp 14   Ht 5\' 5"  (1.651 m)   Wt 65.8 kg   SpO2 100%   BMI 24.13 kg/m   Visual Acuity Right Eye Distance:   Left Eye Distance:   Bilateral Distance:    Right Eye Near:   Left Eye Near:    Bilateral Near:     Physical Exam Vitals and nursing note reviewed.  Constitutional:      General: She is not in acute distress.    Appearance: Normal appearance. She is not ill-appearing.  HENT:     Head: Normocephalic and atraumatic.  Eyes:     General:        Right eye: No discharge.        Left eye: No discharge.     Conjunctiva/sclera: Conjunctivae normal.  Pulmonary:     Effort: Pulmonary effort is normal. No respiratory distress.  Skin:    Comments: Left low back with erythematous vesicular rash.  Neurological:     Mental Status: She is alert.  Psychiatric:        Mood and Affect: Mood normal.        Behavior: Behavior normal.    UC Treatments / Results  Labs (all labs ordered are listed, but only abnormal results are displayed) Labs Reviewed - No data to display  EKG   Radiology No results found.  Procedures Procedures (including critical care time)  Medications Ordered in UC Medications - No data to display  Initial Impression / Assessment and Plan / UC Course  I have reviewed the triage vital signs and the nursing notes.  Pertinent labs & imaging results that were available during my care of the patient were reviewed by me and considered in my medical decision making (see chart for details).    64 year old female presents with shingles.  Treating with Valtrex.  Also placing on prednisone.  Final Clinical Impressions(s) / UC Diagnoses   Final diagnoses:  Herpes zoster without complication      Discharge Instructions     Medications as prescribed.  If pain worsens, please let me know.  Take care  Dr. Lacinda Axon    ED Prescriptions    Medication Sig Dispense Auth. Provider   valACYclovir (VALTREX) 1000 MG tablet Take 1 tablet (1,000 mg total) by mouth 3 (three) times daily for 10 days. 30 tablet Kavontae Pritchard G, DO   predniSONE (DELTASONE) 10 MG tablet 50 mg daily x 2 days, then 40 mg daily x 2 days, then 30 mg daily x 2 days, then 20 mg daily x 2 days, then 10 mg daily x 2 days. 30 tablet Coral Spikes, DO     PDMP not reviewed this encounter.   Coral Spikes, [place] 11/26/19 1035

## 2019-11-26 NOTE — Discharge Instructions (Signed)
Medications as prescribed.  If pain worsens, please let me know.  Take care  Dr. Lacinda Axon

## 2019-11-26 NOTE — ED Triage Notes (Signed)
Patient c/o rash on her lower back that goes around her left side this morning.  Patient c/o ongoing back pain.  Patient states that she started a new muscle relaxer on Thursday.

## 2019-12-02 ENCOUNTER — Telehealth: Payer: Self-pay | Admitting: Internal Medicine

## 2019-12-02 NOTE — Telephone Encounter (Signed)
Patient says the rash is under control but just does not have an appetite or energy has not completed the prednisone taper completes on Monday along with the Valtrex, could this fatigue and no appetite be due to shingles or medication.

## 2019-12-02 NOTE — Telephone Encounter (Signed)
I suspect the fatigue and decreased appetite are related to the shingles diagnosis. This should improve with time. I would suggest rest and monitoring and completing her medication. If her symptoms worsen she should be seen in person.

## 2019-12-02 NOTE — Telephone Encounter (Signed)
Diagnosed with shingles a week ago tomorrow. Taking prednisone and valtrex, feeling like she is so tired she can barley function. Will this go away or is this part of thevirus.

## 2019-12-06 ENCOUNTER — Other Ambulatory Visit: Payer: Self-pay | Admitting: Internal Medicine

## 2019-12-11 ENCOUNTER — Encounter: Payer: Self-pay | Admitting: Emergency Medicine

## 2019-12-11 ENCOUNTER — Emergency Department
Admission: EM | Admit: 2019-12-11 | Discharge: 2019-12-11 | Disposition: A | Discharge status: Home / Self Care | Payer: Medicare HMO | Attending: Emergency Medicine | Admitting: Emergency Medicine

## 2019-12-11 ENCOUNTER — Emergency Department: Payer: Medicare HMO

## 2019-12-11 ENCOUNTER — Other Ambulatory Visit: Payer: Self-pay

## 2019-12-11 DIAGNOSIS — B029 Zoster without complications: Secondary | ICD-10-CM | POA: Diagnosis not present

## 2019-12-11 DIAGNOSIS — Z20822 Contact with and (suspected) exposure to covid-19: Secondary | ICD-10-CM | POA: Insufficient documentation

## 2019-12-11 DIAGNOSIS — Z853 Personal history of malignant neoplasm of breast: Secondary | ICD-10-CM | POA: Insufficient documentation

## 2019-12-11 DIAGNOSIS — Z79899 Other long term (current) drug therapy: Secondary | ICD-10-CM | POA: Diagnosis not present

## 2019-12-11 DIAGNOSIS — Z85038 Personal history of other malignant neoplasm of large intestine: Secondary | ICD-10-CM | POA: Insufficient documentation

## 2019-12-11 DIAGNOSIS — I10 Essential (primary) hypertension: Secondary | ICD-10-CM | POA: Diagnosis not present

## 2019-12-11 DIAGNOSIS — R5383 Other fatigue: Secondary | ICD-10-CM | POA: Insufficient documentation

## 2019-12-11 DIAGNOSIS — R42 Dizziness and giddiness: Secondary | ICD-10-CM | POA: Diagnosis not present

## 2019-12-11 DIAGNOSIS — R110 Nausea: Secondary | ICD-10-CM | POA: Insufficient documentation

## 2019-12-11 DIAGNOSIS — R002 Palpitations: Secondary | ICD-10-CM | POA: Diagnosis not present

## 2019-12-11 LAB — URINALYSIS, COMPLETE (UACMP) WITH MICROSCOPIC
Bacteria, UA: NONE SEEN
Bilirubin Urine: NEGATIVE
Glucose, UA: NEGATIVE mg/dL
Hgb urine dipstick: NEGATIVE
Ketones, ur: NEGATIVE mg/dL
Leukocytes,Ua: NEGATIVE
Nitrite: NEGATIVE
Protein, ur: NEGATIVE mg/dL
Specific Gravity, Urine: 1.006 (ref 1.005–1.030)
Squamous Epithelial / HPF: NONE SEEN (ref 0–5)
WBC, UA: NONE SEEN WBC/hpf (ref 0–5)
pH: 8 (ref 5.0–8.0)

## 2019-12-11 LAB — COMPREHENSIVE METABOLIC PANEL
ALT: 16 U/L (ref 0–44)
AST: 16 U/L (ref 15–41)
Albumin: 3.6 g/dL (ref 3.5–5.0)
Alkaline Phosphatase: 62 U/L (ref 38–126)
Anion gap: 10 (ref 5–15)
BUN: 12 mg/dL (ref 8–23)
CO2: 26 mmol/L (ref 22–32)
Calcium: 9.3 mg/dL (ref 8.9–10.3)
Chloride: 102 mmol/L (ref 98–111)
Creatinine, Ser: 0.74 mg/dL (ref 0.44–1.00)
GFR, Estimated: >60 mL/min (ref >60)
Glucose, Bld: 122 mg/dL — ABNORMAL HIGH (ref 70–99)
Potassium: 4.1 mmol/L (ref 3.5–5.1)
Sodium: 138 mmol/L (ref 135–145)
Total Bilirubin: 0.8 mg/dL (ref 0.3–1.2)
Total Protein: 6.9 g/dL (ref 6.5–8.1)

## 2019-12-11 LAB — CBC WITH DIFFERENTIAL/PLATELET
Abs Immature Granulocytes: 0.08 10*3/uL — ABNORMAL HIGH (ref 0.00–0.07)
Basophils Absolute: 0 10*3/uL (ref 0.0–0.1)
Basophils Relative: 0 %
Eosinophils Absolute: 0.1 10*3/uL (ref 0.0–0.5)
Eosinophils Relative: 0 %
HCT: 41.4 % (ref 36.0–46.0)
Hemoglobin: 13.6 g/dL (ref 12.0–15.0)
Immature Granulocytes: 0 %
Lymphocytes Relative: 6 %
Lymphs Abs: 1.2 10*3/uL (ref 0.7–4.0)
MCH: 30.8 pg (ref 26.0–34.0)
MCHC: 32.9 g/dL (ref 30.0–36.0)
MCV: 93.7 fL (ref 80.0–100.0)
Monocytes Absolute: 0.9 10*3/uL (ref 0.1–1.0)
Monocytes Relative: 5 %
Neutro Abs: 16.9 10*3/uL — ABNORMAL HIGH (ref 1.7–7.7)
Neutrophils Relative %: 89 %
Platelets: 227 10*3/uL (ref 150–400)
RBC: 4.42 MIL/uL (ref 3.87–5.11)
RDW: 13.9 % (ref 11.5–15.5)
WBC: 19.1 10*3/uL — ABNORMAL HIGH (ref 4.0–10.5)
nRBC: 0 % (ref 0.0–0.2)

## 2019-12-11 LAB — RESP PANEL BY RT-PCR (FLU A&B, COVID) ARPGX2
Influenza A by PCR: NEGATIVE
Influenza B by PCR: NEGATIVE
SARS Coronavirus 2 by RT PCR: NEGATIVE

## 2019-12-11 LAB — TROPONIN I (HIGH SENSITIVITY)
Troponin I (High Sensitivity): 7 ng/L (ref <18)
Troponin I (High Sensitivity): 9 ng/L (ref <18)

## 2019-12-11 LAB — MAGNESIUM: Magnesium: 1.9 mg/dL (ref 1.7–2.4)

## 2019-12-11 LAB — T4, FREE: Free T4: 0.98 ng/dL (ref 0.61–1.12)

## 2019-12-11 LAB — TSH: TSH: 1.479 u[IU]/mL (ref 0.350–4.500)

## 2019-12-11 MED ORDER — ONDANSETRON 4 MG PO TBDP: 4.0000 mg | ORAL_TABLET | Freq: Three times a day (TID) | ORAL | 0 refills | Status: DC | PRN

## 2019-12-11 MED ORDER — LORAZEPAM 2 MG/ML IJ SOLN
0.5000 mg | Freq: Once | INTRAMUSCULAR | Status: AC
  Administered 2019-12-11: 0.5 mg via INTRAVENOUS
  Filled 2019-12-11: qty 1

## 2019-12-11 MED ORDER — ONDANSETRON HCL 4 MG/2ML IJ SOLN
INTRAMUSCULAR | Status: AC
  Administered 2019-12-11: 4 mg
  Filled 2019-12-11: qty 2

## 2019-12-11 MED ORDER — LACTATED RINGERS IV BOLUS
1000.0000 mL | Freq: Once | INTRAVENOUS | Status: AC
  Administered 2019-12-11: 1000 mL via INTRAVENOUS

## 2019-12-11 NOTE — ED Notes (Signed)
Pt back in bed and placed back on monitor.

## 2019-12-11 NOTE — ED Provider Notes (Signed)
[facility] Provider Note   ____________________________________________   First MD Initiated Contact with Patient 12/11/19 [phone]     (approximate)  I have reviewed the triage vital signs and the nursing notes.   HISTORY  Chief Complaint Palpitations, Dizziness, and Nausea    HPI Amber Sullivan is a 64 y.o. female with past medical history of hypertension and hyperlipidemia who presents to the ED complaining of palpitations and dizziness.  Patient reports that she has had approximately 1 week of intermittent feeling like her heart is racing along with generalized fatigue, nausea, and poor appetite.  She denies any chest pain but reports feeling uncomfortable in general, with this feeling of discomfort occasionally affecting her chest.  She has not had any fevers and denies any difficulty breathing, but has developed a nonproductive cough over the past couple of days.  She has recently been treated for shingles, but completed course of steroids as well as Valtrex 5 days ago, and rash has been improving.  She has not had any changes in her medications other than taking the Valtrex and steroids recently.  She denies any cardiac history.        Past Medical History:  Diagnosis Date  . Fracture of lateral malleolus 2009  . Fracture of lateral malleolus 01/21/2007  . Hyperlipidemia   . Hypertension 2003  . Positive colorectal cancer screening using Cologuard test 04/20/2016    Patient Active Problem List   Diagnosis Date Noted  . Spasm of muscle of lower back 11/24/2019  . Hyperplastic colon polyp 11/04/2019  . Aortic arch atherosclerosis ([place]) 11/04/2019  . Prediabetes 11/04/2019  . Tendonitis of elbow, right 07/06/2019  . Neoplasm of cheek 04/10/2019  . Screening for breast cancer 04/29/2018  . Travel advice encounter 10/25/2017  . Positive colorectal cancer screening using DNA-based stool test 04/28/2016  . [place] arthritis  04/16/2015  . BPPV (benign paroxysmal positional vertigo) 11/13/2014  . Chronic insomnia 10/27/2014  . Osteoporosis 10/27/2014  . History of abnormal cervical Pap smear 03/30/2014  . Cough 02/17/2014  . Long-term use of high-risk medication 01/30/2014  . Encounter for preventive health examination 10/13/2012  . History of Mohs surgery for squamous cell carcinoma in situ of skin 01/28/2012  . Screening for colon cancer 01/28/2012  . Hyperlipidemia   . Hypertension     Past Surgical History:  Procedure Laterality Date  . CHOLECYSTECTOMY  2005  . COLONOSCOPY WITH PROPOFOL N/A 08/13/2016   Procedure: COLONOSCOPY WITH PROPOFOL;  Surgeon: Robert Bellow, MD;  Location: [facility] ENDOSCOPY;  Service: Endoscopy;  Laterality: N/A;  . MOHS SURGERY  2007   squamou cell carcinoma rt nasolabial fold  . SQUAMOUS CELL CARCINOMA EXCISION  2007   mole on face    Prior to Admission medications   Medication Sig Start Date End Date Taking? Authorizing Provider  albuterol (VENTOLIN HFA) 108 (90 Base) MCG/ACT inhaler Inhale 2 puffs into the lungs every 6 (six) hours as needed for wheezing or shortness of breath. 05/10/19   Crecencio Mc, MD  Calcium Carbonate-Vit D-Min (CALTRATE 600+D PLUS MINERALS) 600-800 MG-UNIT TABS Take 1 tablet by mouth daily. Taking 1 tablet every other day    [provider]  celecoxib (CELEBREX) 200 MG capsule Take 200 mg by mouth 2 (two) times daily.    [provider]  diclofenac Sodium (VOLTAREN) 1 % GEL Apply 4 g topically 4 (four) times daily. 07/05/19   Crecencio Mc, MD  diphenhydrAMINE (BENADRYL) 25  MG tablet Take 25 mg by mouth daily.    [provider]  estradiol (ESTRACE VAGINAL) 0.1 MG/GM vaginal cream Place 1 Applicatorful vaginally 3 (three) times a week. 05/06/19   Crecencio Mc, MD  losartan (COZAAR) 100 MG tablet TAKE 1 TABLET DAILY Patient taking differently: Take 100 mg by mouth daily.  12/06/19   Crecencio Mc, MD  ondansetron  (ZOFRAN ODT) 4 MG disintegrating tablet Take 1 tablet (4 mg total) by mouth every 8 (eight) hours as needed for nausea or vomiting. 12/11/19   Blake Divine, MD  predniSONE (DELTASONE) 10 MG tablet 50 mg daily x 2 days, then 40 mg daily x 2 days, then 30 mg daily x 2 days, then 20 mg daily x 2 days, then 10 mg daily x 2 days. 11/26/19   Coral Spikes, DO  rosuvastatin (CRESTOR) 10 MG tablet Take 1 tablet (10 mg total) by mouth daily. 11/09/19   Crecencio Mc, MD  tizanidine (ZANAFLEX) 2 MG capsule Take 1 capsule (2 mg total) by mouth 3 (three) times daily. 11/23/19   Crecencio Mc, MD  traZODone (DESYREL) 50 MG tablet Take 0.5-1 tablets (25-50 mg total) by mouth at bedtime as needed for sleep. 07/08/19   Crecencio Mc, MD  zolpidem (AMBIEN) 5 MG tablet Take 1 tablet (5 mg total) by mouth at bedtime as needed for sleep. 08/09/19   Crecencio Mc, MD    Allergies Ace inhibitors  Family History  Problem Relation Age of Onset  . Hypertension Mother   . Diabetes Mother        type 2  . Stroke Mother 57       massive, with aphasia and paraplegia  . Hypertension Father   . Hyperlipidemia Father   . Heart attack Father        vs PE during hospitalization for chest pain   . Heart disease Father   . Heart attack Maternal Grandfather   . Heart disease Paternal Grandmother        CHF  . Heart disease Paternal Grandfather   . Heart attack Paternal Grandfather   . Heart attack Brother 52  . Breast cancer Neg Hx     Social History Social History   Tobacco Use  . Smoking status: Never Smoker  . Smokeless tobacco: Never Used  Vaping Use  . Vaping Use: Never used  Substance Use Topics  . Alcohol use: Yes    Comment: not much  . Drug use: No    Review of Systems  Constitutional: No fever/chills.  Positive for generalized weakness and fatigue. Eyes: No visual changes. ENT: No sore throat. Cardiovascular: Positive for chest pain.  Positive for palpitations. Respiratory: Denies  shortness of breath.  Positive for cough. Gastrointestinal: No abdominal pain.  Positive for nausea, no vomiting.  No diarrhea.  No constipation. Genitourinary: Negative for dysuria. Musculoskeletal: Negative for back pain. Skin: Negative for rash. Neurological: Negative for headaches, focal weakness or numbness.  ____________________________________________   PHYSICAL EXAM:  VITAL SIGNS: ED Triage Vitals [12/11/19 0854]  Enc Vitals Group     BP (!) 153/71     Pulse Rate (!) 115     Resp 16     Temp 98.2 F (36.8 C)     Temp Source Oral     SpO2 98 %     Weight 145 lb (65.8 kg)     Height 5\' 5"  (1.651 m)     Head Circumference  Peak Flow      Pain Score 0     Pain Loc      Pain Edu?      Excl. in [place]?     Constitutional: Alert and oriented. Eyes: Conjunctivae are normal. Head: Atraumatic. Nose: No congestion/rhinnorhea. Mouth/Throat: Mucous membranes are dry. Neck: Normal ROM Cardiovascular: Tachycardic, regular rhythm. Grossly normal heart sounds. Respiratory: Normal respiratory effort.  No retractions. Lungs CTAB. Gastrointestinal: Soft and nontender. No distention. Genitourinary: deferred Musculoskeletal: No lower extremity tenderness nor edema. Neurologic:  Normal speech and language. No gross focal neurologic deficits are appreciated. Skin:  Skin is warm, dry and intact. No rash noted. Psychiatric: Mood and affect are normal. Speech and behavior are normal.  ____________________________________________   LABS (all labs ordered are listed, but only abnormal results are displayed)  Labs Reviewed  CBC WITH DIFFERENTIAL/PLATELET - Abnormal; Notable for the following components:      Result Value   WBC 19.1 (*)    Neutro Abs 16.9 (*)    Abs Immature Granulocytes 0.08 (*)    All other components within normal limits  COMPREHENSIVE METABOLIC PANEL - Abnormal; Notable for the following components:   Glucose, Bld 122 (*)    All other components within  normal limits  URINALYSIS, COMPLETE (UACMP) WITH MICROSCOPIC - Abnormal; Notable for the following components:   Color, Urine STRAW (*)    APPearance CLEAR (*)    All other components within normal limits  RESP PANEL BY RT-PCR (FLU A&B, COVID) ARPGX2  MAGNESIUM  TSH  T4, FREE  TROPONIN I (HIGH SENSITIVITY)  TROPONIN I (HIGH SENSITIVITY)   ____________________________________________  EKG  ED ECG REPORT I, Blake Divine, the attending physician, personally viewed and interpreted this ECG.   Date: 12/11/2019  EKG Time: 8:37  Rate: 124  Rhythm: sinus tachycardia  Axis: RAD  Intervals:none  ST&T Change: None   PROCEDURES  Procedure(s) performed (including Critical Care):  .1-3 Lead EKG Interpretation Performed by: Blake Divine, MD Authorized by: Blake Divine, MD     Interpretation: abnormal     ECG rate:  111   ECG rate assessment: tachycardic     Rhythm: sinus tachycardia     Ectopy: none     Conduction: normal       ____________________________________________   INITIAL IMPRESSION / ASSESSMENT AND PLAN / ED COURSE       64 year old female with past medical history of hypertension and hyperlipidemia presents to the ED complaining of about 1 week of intermittent generalized weakness with palpitations and chest discomfort.  Symptoms became more severe this morning and she also noticed a cough this morning.  EKG shows sinus tachycardia with no ischemic changes, plan to check 2 sets of troponin.  We will further evaluate for infectious process with chest x-ray and UA.  Also check for anemia, electrolyte abnormality, or thyroid dysfunction.  Dehydration could be a factor given patient's poor appetite and we will hydrate with IV fluids.  Labs are unremarkable, thyroid studies within normal limits.  Chest x-ray reviewed by me and shows no infiltrate, edema, or effusion.  UA also not consistent with infection.  Patient's tachycardia has improved with IV fluids, she  did develop some nausea but this improved with Zofran and she has subsequently been able to tolerate p.o.  Patient does report increasing anxiety with the symptoms and I do wonder if there is a component of anxiety.  She is appropriate for discharge home with PCP follow-up, was counseled to return to the  ED for new worsening symptoms.  Patient agrees with plan.      ____________________________________________   FINAL CLINICAL IMPRESSION(S) / ED DIAGNOSES  Final diagnoses:  Palpitations  Herpes zoster without complication     ED Discharge Orders         Ordered    ondansetron (ZOFRAN ODT) 4 MG disintegrating tablet  Every 8 hours PRN        12/11/19 1352           Note:  This document was prepared using Dragon voice recognition software and may include unintentional dictation errors.   Blake Divine, MD 12/11/19 1354

## 2019-12-11 NOTE — ED Notes (Signed)
Pt back from X-ray.  

## 2019-12-11 NOTE — ED Notes (Signed)
Pt given warm blankets and placed on monitor

## 2019-12-11 NOTE — ED Notes (Signed)
Pt and husband verbalized understanding of d/c instructions at this time. Pt denies further questions at this time.

## 2019-12-11 NOTE — ED Notes (Signed)
Pt taken to Xray.

## 2019-12-11 NOTE — ED Notes (Signed)
Pt to X-Ray at this time.

## 2019-12-11 NOTE — ED Notes (Addendum)
First Nurse Note: Pt to ED via POV stating that she is feeling lightheaded and like her heart is racing. Pt also states that she is having nausea. Pt offered wheelchair by this RN but declined. Pt pulled for EKG.

## 2019-12-11 NOTE — ED Notes (Signed)
Pt up to restroom at this time, tolerated well

## 2019-12-11 NOTE — ED Notes (Signed)
EDP [place] at bedside

## 2019-12-11 NOTE — ED Triage Notes (Signed)
PT to ED via POV stating that she is having palpitations, nausea, and dizziness. Pt states that symptoms started last night and got worse this morning. Pt is currently being treated for shingles on her left lower side. Pt finished prednisone on Tuesday. Pt is in NAD.

## 2019-12-14 ENCOUNTER — Telehealth (INDEPENDENT_AMBULATORY_CARE_PROVIDER_SITE_OTHER): Payer: Medicare HMO | Admitting: Internal Medicine

## 2019-12-14 ENCOUNTER — Encounter: Payer: Self-pay | Admitting: Internal Medicine

## 2019-12-14 ENCOUNTER — Other Ambulatory Visit: Payer: Self-pay

## 2019-12-14 VITALS — Ht 65.0 in | Wt 145.0 lb

## 2019-12-14 DIAGNOSIS — F418 Other specified anxiety disorders: Secondary | ICD-10-CM | POA: Diagnosis not present

## 2019-12-14 DIAGNOSIS — D72825 Bandemia: Secondary | ICD-10-CM

## 2019-12-14 DIAGNOSIS — B0229 Other postherpetic nervous system involvement: Secondary | ICD-10-CM | POA: Diagnosis not present

## 2019-12-14 DIAGNOSIS — R000 Tachycardia, unspecified: Secondary | ICD-10-CM

## 2019-12-14 MED ORDER — GABAPENTIN 100 MG PO CAPS: 100.0000 mg | ORAL_CAPSULE | Freq: Three times a day (TID) | ORAL | 3 refills | Status: DC

## 2019-12-14 MED ORDER — ALPRAZOLAM 0.25 MG PO TABS: 0.2500 mg | ORAL_TABLET | Freq: Two times a day (BID) | ORAL | 0 refills | Status: DC | PRN

## 2019-12-14 NOTE — Patient Instructions (Signed)
Gabapentin

## 2019-12-14 NOTE — Progress Notes (Signed)
Virtual Visit via [facility]  This visit type was conducted due to national recommendations for restrictions regarding the COVID-19 pandemic (e.g. social distancing).  This format is felt to be most appropriate for this patient at this time.  All issues noted in this document were discussed and addressed.  No physical exam was performed (except for noted visual exam findings with Video Visits).   I connected with@ on 12/14/19 at 11:00 AM EST by a video enabled telemedicine application  and verified that I am speaking with the correct person using two identifiers. Location patient: home Location provider: work or home office Persons participating in the virtual visit: patient, provider  I discussed the limitations, risks, security and privacy concerns of performing an evaluation and management service by telephone and the availability of in person appointments. I also discussed with the patient that there may be a patient responsible charge related to this service. The patient expressed understanding and agreed to proceed.  Reason for visit: post shingles /ER follow up  HPI:  64 yr old healthy female being seen for follow up after ER visit for palpitations, nausea and  fatigue following treatment for shingles outbreak  HPI: presented to PCP on Nov 3  with unilateral back pain suggestive of spasm, but subsequently developed painful vesicular rash to same area. Was treated by Dr Lacinda Axon  On Nov 6 with antiviral and prednisone taper.  Developed palpitations, weakness,  Nausea and dizziness.   went to ER for evaluation  On nov 21 after symptoms were present for nearly a week and began prior to finishing the prednisone taper.  Chest x ray and Labs/lytes were normal except for demargination on CBC.  EKG sinus tach rate 124.  She was given  IV fluids and IV ativan and symptoms resolved.  She has not had a recurrence of tachycardia since discharge but remains very anxious about the chang ein her body that she  could not control. .    ROS: See pertinent positives and negatives per HPI.  Past Medical History:  Diagnosis Date  . Fracture of lateral malleolus 2009  . Fracture of lateral malleolus 01/21/2007  . Hyperlipidemia   . Hypertension 2003  . Positive colorectal cancer screening using Cologuard test 04/20/2016    Past Surgical History:  Procedure Laterality Date  . CHOLECYSTECTOMY  2005  . COLONOSCOPY WITH PROPOFOL N/A 08/13/2016   Procedure: COLONOSCOPY WITH PROPOFOL;  Surgeon: Robert Bellow, MD;  Location: [facility] ENDOSCOPY;  Service: Endoscopy;  Laterality: N/A;  . MOHS SURGERY  2007   squamou cell carcinoma rt nasolabial fold  . SQUAMOUS CELL CARCINOMA EXCISION  2007   mole on face    Family History  Problem Relation Age of Onset  . Hypertension Mother   . Diabetes Mother        type 2  . Stroke Mother 91       massive, with aphasia and paraplegia  . Hypertension Father   . Hyperlipidemia Father   . Heart attack Father        vs PE during hospitalization for chest pain   . Heart disease Father   . Heart attack Maternal Grandfather   . Heart disease Paternal Grandmother        CHF  . Heart disease Paternal Grandfather   . Heart attack Paternal Grandfather   . Heart attack Brother 29  . Breast cancer Neg Hx     SOCIAL HX:  reports that she has never smoked. She has never used  smokeless tobacco. She reports current alcohol use. She reports that she does not use drugs.   Current Outpatient Medications:  .  albuterol (VENTOLIN HFA) 108 (90 Base) MCG/ACT inhaler, Inhale 2 puffs into the lungs every 6 (six) hours as needed for wheezing or shortness of breath., Disp: 18 g, Rfl: 2 .  Calcium Carbonate-Vit D-Min (CALTRATE 600+D PLUS MINERALS) 600-800 MG-UNIT TABS, Take 1 tablet by mouth daily. Taking 1 tablet every other day, Disp: , Rfl:  .  diclofenac Sodium (VOLTAREN) 1 % GEL, Apply 4 g topically 4 (four) times daily., Disp: 100 g, Rfl: 2 .  diphenhydrAMINE (BENADRYL) 25  MG tablet, Take 25 mg by mouth daily., Disp: , Rfl:  .  estradiol (ESTRACE VAGINAL) 0.1 MG/GM vaginal cream, Place 1 Applicatorful vaginally 3 (three) times a week., Disp: 42.5 g, Rfl: 2 .  losartan (COZAAR) 100 MG tablet, TAKE 1 TABLET DAILY (Patient taking differently: Take 100 mg by mouth daily. ), Disp: 90 tablet, Rfl: 3 .  ondansetron (ZOFRAN ODT) 4 MG disintegrating tablet, Take 1 tablet (4 mg total) by mouth every 8 (eight) hours as needed for nausea or vomiting., Disp: 12 tablet, Rfl: 0 .  traZODone (DESYREL) 50 MG tablet, Take 0.5-1 tablets (25-50 mg total) by mouth at bedtime as needed for sleep., Disp: 90 tablet, Rfl: 0 .  zolpidem (AMBIEN) 5 MG tablet, Take 1 tablet (5 mg total) by mouth at bedtime as needed for sleep., Disp: 90 tablet, Rfl: 1 .  ALPRAZolam (XANAX) 0.25 MG tablet, Take 1 tablet (0.25 mg total) by mouth 2 (two) times daily as needed for anxiety., Disp: 20 tablet, Rfl: 0 .  gabapentin (NEURONTIN) 100 MG capsule, Take 1 capsule (100 mg total) by mouth 3 (three) times daily., Disp: 90 capsule, Rfl: 3 .  rosuvastatin (CRESTOR) 10 MG tablet, Take 1 tablet (10 mg total) by mouth daily. (Patient not taking: Reported on 12/14/2019), Disp: 90 tablet, Rfl: 1  EXAM:  VITALS per patient if applicable:  GENERAL: alert, oriented, appears well and in no acute distress  HEENT: atraumatic, conjunttiva clear, no obvious abnormalities on inspection of external nose and ears  NECK: normal movements of the head and neck  LUNGS: on inspection no signs of respiratory distress, breathing rate appears normal, no obvious gross SOB, gasping or wheezing  CV: no obvious cyanosis  MS: moves all visible extremities without noticeable abnormality  PSYCH/NEURO: pleasant and cooperative, no obvious depression or anxiety, speech and thought processing grossly intact  ASSESSMENT AND PLAN:  Discussed the following assessment and plan:  Bandemia - Plan: CBC with  Differential/Platelet  Inappropriate sinus node tachycardia  Anxiety about health  Post herpetic neuralgia  Inappropriate sinus node tachycardia Occurred In the setting of treatment for shingles with antivirals and steroids.  Now resolved.  Etiology unclear : medication side effect vs steroid induced adrenal insufficiency less likely given elevated blood pressure and normal labs. No further workup unless she has another episode unprovoked by illness.     Anxiety about health She has had considerable anxiety resulting from the last several weeks of pain, nausea and tachycardia.  Prescribing a limited quantity of alprazolam to use for next episode . The risks and benefits of benzodiazepine use were discussed with patient today including excessive sedation leading to respiratory depression,  impaired thinking/driving, and addiction.  Patient was advised to avoid concurrent use with alcohol, to use medication only as needed and not to share with others  .   Post herpetic neuralgia Her  original presentation of pain was attributed to muscle spasm but later declared itself when the typical shingles rash developed.  She was treated with antiviral and prednisone.  Today I am prescribing gabapentin for use in managing her persistent left lower back pain   Leukocytosis demargination suspected from recent use of prednisone . Repeat in 2 weeks     I discussed the assessment and treatment plan with the patient. The patient was provided an opportunity to ask questions and all were answered. The patient agreed with the plan and demonstrated an understanding of the instructions.   The patient was advised to call back or seek an in-person evaluation if the symptoms worsen or if the condition fails to improve as anticipated.  I provided 40 minutes of face-to-face time during this encounter.   Crecencio Mc, MD

## 2019-12-16 DIAGNOSIS — D72829 Elevated white blood cell count, unspecified: Secondary | ICD-10-CM | POA: Insufficient documentation

## 2019-12-16 DIAGNOSIS — F418 Other specified anxiety disorders: Secondary | ICD-10-CM | POA: Insufficient documentation

## 2019-12-16 DIAGNOSIS — R000 Tachycardia, unspecified: Secondary | ICD-10-CM | POA: Insufficient documentation

## 2019-12-16 DIAGNOSIS — I4711 Inappropriate sinus tachycardia, so stated: Secondary | ICD-10-CM | POA: Insufficient documentation

## 2019-12-16 NOTE — Assessment & Plan Note (Signed)
Her original presentation of pain was attributed to muscle spasm but later declared itself when the typical shingles rash developed.  She was treated with antiviral and prednisone.  Today I am prescribing gabapentin for use in managing her persistent left lower back pain

## 2019-12-16 NOTE — Assessment & Plan Note (Signed)
She has had considerable anxiety resulting from the last several weeks of pain, nausea and tachycardia.  Prescribing a limited quantity of alprazolam to use for next episode . The risks and benefits of benzodiazepine use were discussed with patient today including excessive sedation leading to respiratory depression,  impaired thinking/driving, and addiction.  Patient was advised to avoid concurrent use with alcohol, to use medication only as needed and not to share with others  .

## 2019-12-16 NOTE — Assessment & Plan Note (Signed)
demargination suspected from recent use of prednisone . Repeat in 2 weeks

## 2019-12-16 NOTE — Assessment & Plan Note (Addendum)
Occurred In the setting of treatment for shingles with antivirals and steroids.  Now resolved.  Etiology unclear : medication side effect vs steroid induced adrenal insufficiency less likely given elevated blood pressure and normal labs. No further workup unless she has another episode unprovoked by illness.

## 2019-12-27 NOTE — Telephone Encounter (Signed)
Pt called to schedule f/u with PCP in 1-2 weeks per Dr Derrel Nip. There are no available appts at this time other than hospital follow ups. Please advise

## 2019-12-29 NOTE — Telephone Encounter (Signed)
Pt has been scheduled.  °

## 2020-01-03 ENCOUNTER — Other Ambulatory Visit: Payer: Self-pay

## 2020-01-03 ENCOUNTER — Encounter: Payer: Self-pay | Admitting: Internal Medicine

## 2020-01-03 ENCOUNTER — Ambulatory Visit: Payer: Medicare HMO | Admitting: Internal Medicine

## 2020-01-03 DIAGNOSIS — F418 Other specified anxiety disorders: Secondary | ICD-10-CM

## 2020-01-03 DIAGNOSIS — I10 Essential (primary) hypertension: Secondary | ICD-10-CM

## 2020-01-03 DIAGNOSIS — B0229 Other postherpetic nervous system involvement: Secondary | ICD-10-CM

## 2020-01-03 MED ORDER — CARVEDILOL 3.125 MG PO TABS: 3.1250 mg | ORAL_TABLET | Freq: Two times a day (BID) | ORAL | 3 refills | Status: DC

## 2020-01-03 NOTE — Patient Instructions (Signed)
I recommend A TRIAL of 1000 mg tylenol twice daily for your pain   If this helps you can start weaning your gabapentin   Adding 3.125 mg carvedilol twice daily for blood pressure   I still recommend getting the shingrx in January

## 2020-01-03 NOTE — Progress Notes (Signed)
Subjective:  Patient ID: [name], female    DOB: 25-Nov-1955  Age: 64 y.o. MRN: [id number]  CC: Diagnoses of Primary hypertension, Post herpetic neuralgia, and Anxiety about health were pertinent to this visit.  HPI Arantxa Linn Owen presents for FOLLOW UP ON 1) SHINGLES PAIN   2) HYPERTENSION  This visit occurred during the SARS-CoV-2 public health emergency.  Safety protocols were in place, including screening questions prior to the visit, additional usage of staff PPE, and extensive cleaning of exam room while observing appropriate contact time as indicated for disinfecting solutions.    Last encounter was nov 24;  Xanax was added  To help managed her anxiety surrounding her pain , resulting in hypertension .  She has taken it only once   Still having shingles pain involving the left upper leg ; pain is worse by late afternoon. Taking gabapentin 500 mg daily cumulative dose. Marland Kitchen  She was diagnosed on Dec  6 by ER visit.  Her pain is somewhat relieved by a weighted blanket on hip .  feels a little forgetful on it.  Also using cold packs and lidocaine patches which cause irritation after a few hours   BPs still high at 841 systolic by home readings.   none below 324 systolic pulse 80 to 99 .    She remains quite uneasy by her recent ordeal and her inability to resolve her pain and elevated blood pressure readings. She has had some insomnia .   Outpatient Medications Prior to Visit  Medication Sig Dispense Refill  . albuterol (VENTOLIN HFA) 108 (90 Base) MCG/ACT inhaler Inhale 2 puffs into the lungs every 6 (six) hours as needed for wheezing or shortness of breath. 18 g 2  . ALPRAZolam (XANAX) 0.25 MG tablet Take 1 tablet (0.25 mg total) by mouth 2 (two) times daily as needed for anxiety. 20 tablet 0  . Calcium Carbonate-Vit D-Min (CALTRATE 600+D PLUS MINERALS) 600-800 MG-UNIT TABS Take 1 tablet by mouth daily. Taking 1 tablet every other day    . diclofenac Sodium (VOLTAREN) 1 %  GEL Apply 4 g topically 4 (four) times daily. 100 g 2  . diphenhydrAMINE (BENADRYL) 25 MG tablet Take 25 mg by mouth daily.    Marland Kitchen estradiol (ESTRACE VAGINAL) 0.1 MG/GM vaginal cream Place 1 Applicatorful vaginally 3 (three) times a week. 42.5 g 2  . losartan (COZAAR) 100 MG tablet TAKE 1 TABLET DAILY (Patient taking differently: Take 100 mg by mouth daily.) 90 tablet 3  . ondansetron (ZOFRAN ODT) 4 MG disintegrating tablet Take 1 tablet (4 mg total) by mouth every 8 (eight) hours as needed for nausea or vomiting. 12 tablet 0  . rosuvastatin (CRESTOR) 10 MG tablet Take 1 tablet (10 mg total) by mouth daily. 90 tablet 1  . zolpidem (AMBIEN) 5 MG tablet Take 1 tablet (5 mg total) by mouth at bedtime as needed for sleep. 90 tablet 1  . gabapentin (NEURONTIN) 100 MG capsule Take 1 capsule (100 mg total) by mouth 3 (three) times daily. 90 capsule 3  . traZODone (DESYREL) 50 MG tablet Take 0.5-1 tablets (25-50 mg total) by mouth at bedtime as needed for sleep. 90 tablet 0   No facility-administered medications prior to visit.    Review of Systems;  Patient denies headache, fevers, malaise, unintentional weight loss, skin rash, eye pain, sinus congestion and sinus pain, sore throat, dysphagia,  hemoptysis , cough, dyspnea, wheezing, chest pain, palpitations, orthopnea, edema, abdominal pain, nausea, melena, diarrhea, constipation,  flank pain, dysuria, hematuria, urinary  Frequency, nocturia, numbness, tingling, seizures,  Focal weakness, Loss of consciousness,  Tremor, insomnia, depression, anxiety, and suicidal ideation.      Objective:  BP (!) 146/78 (BP Location: Left Arm, Patient Position: Sitting)   Pulse 99   Temp 98.3 F (36.8 C)   Ht 5\' 5"  (1.651 m)   Wt 143 lb 6.4 oz (65 kg)   SpO2 97%   BMI 23.86 kg/m   BP Readings from Last 3 Encounters:  01/03/20 (!) 146/78  12/11/19 (!) 165/70  11/26/19 (!) 146/71    Wt Readings from Last 3 Encounters:  01/03/20 143 lb 6.4 oz (65 kg)   12/14/19 145 lb (65.8 kg)  12/11/19 145 lb (65.8 kg)    General appearance: alert, cooperative and appears stated age Ears: normal TM's and external ear canals both ears Throat: lips, mucosa, and tongue normal; teeth and gums normal Neck: no adenopathy, no carotid bruit, supple, symmetrical, trachea midline and thyroid not enlarged, symmetric, no tenderness/mass/nodules Back: symmetric, no curvature. ROM normal. No CVA tenderness. Lungs: clear to auscultation bilaterally Heart: regular rate and rhythm, S1, S2 normal, no murmur, click, rub or gallop Abdomen: soft, non-tender; bowel sounds normal; no masses,  no organomegaly Pulses: 2+ and symmetric Skin: Skin color, texture, turgor normal. No rashes or lesions Lymph nodes: Cervical, supraclavicular, and axillary nodes normal.  Lab Results  Component Value Date   HGBA1C 6.0 11/02/2019    Lab Results  Component Value Date   CREATININE 0.74 12/11/2019   CREATININE 0.76 11/02/2019   CREATININE 0.81 05/02/2019    Lab Results  Component Value Date   WBC 19.1 (H) 12/11/2019   HGB 13.6 12/11/2019   HCT 41.4 12/11/2019   PLT 227 12/11/2019   GLUCOSE 122 (H) 12/11/2019   CHOL 191 11/02/2019   TRIG 104.0 11/02/2019   HDL 55.80 11/02/2019   LDLDIRECT 109.0 10/25/2014   LDLCALC 115 (H) 11/02/2019   ALT 16 12/11/2019   AST 16 12/11/2019   NA 138 12/11/2019   K 4.1 12/11/2019   CL 102 12/11/2019   CREATININE 0.74 12/11/2019   BUN 12 12/11/2019   CO2 26 12/11/2019   TSH 1.479 12/11/2019   HGBA1C 6.0 11/02/2019   MICROALBUR 0.5 08/30/2012    DG Chest 2 View  Result Date: 12/11/2019 CLINICAL DATA:  Palpitations. EXAM: CHEST - 2 VIEW COMPARISON:  02/15/2018 FINDINGS: The cardiomediastinal silhouette is unremarkable. There is no evidence of focal airspace disease, pulmonary edema, suspicious pulmonary nodule/mass, pleural effusion, or pneumothorax. No acute bony abnormalities are identified. IMPRESSION: No active cardiopulmonary  disease. Electronically Signed   By: Margarette Canada M.D.   On: 12/11/2019 10:19    Assessment & Plan:   Problem List Items Addressed This Visit      Unprioritized   Hypertension    Her home readings are elevated but her machine overestimated her blood pressure.  Will add carvedilol for adjusted readings > 140/80      Relevant Medications   carvedilol (COREG) 3.125 MG tablet   Post herpetic neuralgia    Involving left thigh .  pain has improved but has persisted .  Continue gabapentin and add tylenol up to 2000 mg daily       Anxiety about health    She has had considerable anxiety resulting from the last several weeks of pain, nausea and tachycardia.  She has been prescribed a limited quantity of alprazolam to use for next episode . The risks and  benefits of benzodiazepine use were discussed with patient today including excessive sedation leading to respiratory depression,  impaired thinking/driving, and addiction.  Patient was advised to avoid concurrent use with alcohol, to use medication only as needed and not to share with others  .          I provided  30 minutes of  face-to-face time during this encounter reviewing patient's current problems and past surgeries, labs and imaging studies, providing counseling on the above mentioned problems , and coordination  of care .   I am having Charlean L. Freiberger start on carvedilol. I am also having her maintain her Caltrate 600+D Plus Minerals, diphenhydrAMINE, estradiol, albuterol, diclofenac Sodium, zolpidem, rosuvastatin, losartan, ondansetron, and ALPRAZolam.  Meds ordered this encounter  Medications  . carvedilol (COREG) 3.125 MG tablet    Sig: Take 1 tablet (3.125 mg total) by mouth 2 (two) times daily with a meal.    Dispense:  60 tablet    Refill:  3    There are no discontinued medications.  Follow-up: No follow-ups on file.   Crecencio Mc, MD

## 2020-01-04 ENCOUNTER — Other Ambulatory Visit: Payer: Self-pay

## 2020-01-04 MED ORDER — TRAZODONE HCL 50 MG PO TABS
25.0000 mg | ORAL_TABLET | Freq: Every evening | ORAL | 0 refills | Status: DC | PRN
Start: 2020-01-04 — End: 2020-03-12

## 2020-01-05 ENCOUNTER — Telehealth: Payer: Self-pay | Admitting: Internal Medicine

## 2020-01-05 ENCOUNTER — Other Ambulatory Visit: Payer: Self-pay | Admitting: Internal Medicine

## 2020-01-05 MED ORDER — GABAPENTIN 100 MG PO CAPS
500.0000 mg | ORAL_CAPSULE | Freq: Every day | ORAL | 0 refills | Status: DC
Start: 2020-01-05 — End: 2020-01-05

## 2020-01-05 MED ORDER — GABAPENTIN 100 MG PO CAPS: 500.0000 mg | ORAL_CAPSULE | Freq: Every day | ORAL | 1 refills | Status: DC

## 2020-01-05 NOTE — Assessment & Plan Note (Signed)
She has had considerable anxiety resulting from the last several weeks of pain, nausea and tachycardia.  She has been prescribed a limited quantity of alprazolam to use for next episode . The risks and benefits of benzodiazepine use were discussed with patient today including excessive sedation leading to respiratory depression,  impaired thinking/driving, and addiction.  Patient was advised to avoid concurrent use with alcohol, to use medication only as needed and not to share with others  .

## 2020-01-05 NOTE — Telephone Encounter (Signed)
Pt needs a refill on gabapentin (NEURONTIN) 100 MG capsule  She stated that at her last appt Dr. Derrel Nip increased her to 500mg  daily and the rx has ran out  She needs a new rx sent in for the 500mg  daily   Pt would like a call back when this is sent to the Pharmacy

## 2020-01-05 NOTE — Addendum Note (Signed)
Addended by: Elpidio Galea T on: 01/05/2020 12:51 PM   Modules accepted: Orders

## 2020-01-05 NOTE — Telephone Encounter (Signed)
Pt said she needs at least a weeks worth of Gabapentin sent to [facility] because she won't get the mail order until next week and she runs out of medication on Saturday morning. Can this please be sent in?

## 2020-01-05 NOTE — Assessment & Plan Note (Signed)
Her home readings are elevated but her machine overestimated her blood pressure.  Will add carvedilol for adjusted readings > 140/80

## 2020-01-05 NOTE — Assessment & Plan Note (Signed)
Involving left thigh .  pain has improved but has persisted .  Continue gabapentin and add tylenol up to 2000 mg daily

## 2020-02-25 ENCOUNTER — Other Ambulatory Visit: Payer: Self-pay | Admitting: Internal Medicine

## 2020-02-27 MED ORDER — ZOLPIDEM TARTRATE 5 MG PO TABS: 5.0000 mg | ORAL_TABLET | Freq: Every evening | ORAL | 5 refills | Status: DC | PRN

## 2020-02-27 NOTE — Telephone Encounter (Signed)
RX Refill:ambien Last Seen:01-03-20 Last ordered:08-09-19

## 2020-03-11 ENCOUNTER — Other Ambulatory Visit: Payer: Self-pay | Admitting: Internal Medicine

## 2020-04-03 ENCOUNTER — Telehealth: Payer: Self-pay | Admitting: Internal Medicine

## 2020-04-03 DIAGNOSIS — E782 Mixed hyperlipidemia: Secondary | ICD-10-CM

## 2020-04-03 DIAGNOSIS — I10 Essential (primary) hypertension: Secondary | ICD-10-CM

## 2020-04-03 NOTE — Telephone Encounter (Signed)
Pt would like to have her labs done before her appt in April. I have ordered a microalbumin, lipid panel and a cmp. Is there anything else that needs to be ordered?

## 2020-04-03 NOTE — Telephone Encounter (Signed)
Pt would like labs placed before her up coming appt in April

## 2020-04-03 NOTE — Addendum Note (Signed)
Addended by: Adair Laundry on: 04/03/2020 12:57 PM   Modules accepted: Orders

## 2020-04-30 ENCOUNTER — Other Ambulatory Visit: Payer: Self-pay

## 2020-04-30 ENCOUNTER — Other Ambulatory Visit (INDEPENDENT_AMBULATORY_CARE_PROVIDER_SITE_OTHER): Payer: Medicare HMO

## 2020-04-30 DIAGNOSIS — I10 Essential (primary) hypertension: Secondary | ICD-10-CM

## 2020-04-30 DIAGNOSIS — E782 Mixed hyperlipidemia: Secondary | ICD-10-CM

## 2020-04-30 DIAGNOSIS — D72825 Bandemia: Secondary | ICD-10-CM

## 2020-04-30 LAB — CBC WITH DIFFERENTIAL/PLATELET
Basophils Absolute: 0 10*3/uL (ref 0.0–0.1)
Basophils Relative: 1 % (ref 0.0–3.0)
Eosinophils Absolute: 0.1 10*3/uL (ref 0.0–0.7)
Eosinophils Relative: 1.6 % (ref 0.0–5.0)
HCT: 41.2 % (ref 36.0–46.0)
Hemoglobin: 13.6 g/dL (ref 12.0–15.0)
Lymphocytes Relative: 39.3 % (ref 12.0–46.0)
Lymphs Abs: 1.7 10*3/uL (ref 0.7–4.0)
MCHC: 33.1 g/dL (ref 30.0–36.0)
MCV: 93.5 fl (ref 78.0–100.0)
Monocytes Absolute: 0.3 10*3/uL (ref 0.1–1.0)
Monocytes Relative: 8.2 % (ref 3.0–12.0)
Neutro Abs: 2.1 10*3/uL (ref 1.4–7.7)
Neutrophils Relative %: 49.9 % (ref 43.0–77.0)
Platelets: 202 10*3/uL (ref 150.0–400.0)
RBC: 4.41 Mil/uL (ref 3.87–5.11)
RDW: 13 % (ref 11.5–15.5)
WBC: 4.2 10*3/uL (ref 4.0–10.5)

## 2020-04-30 LAB — COMPREHENSIVE METABOLIC PANEL
ALT: 13 U/L (ref 0–35)
AST: 14 U/L (ref 0–37)
Albumin: 3.9 g/dL (ref 3.5–5.2)
Alkaline Phosphatase: 56 U/L (ref 39–117)
BUN: 14 mg/dL (ref 6–23)
CO2: 29 mEq/L (ref 19–32)
Calcium: 9.2 mg/dL (ref 8.4–10.5)
Chloride: 105 mEq/L (ref 96–112)
Creatinine, Ser: 0.8 mg/dL (ref 0.40–1.20)
GFR: 76.71 mL/min (ref >60.00)
Glucose, Bld: 92 mg/dL (ref 70–99)
Potassium: 4.8 mEq/L (ref 3.5–5.1)
Sodium: 139 mEq/L (ref 135–145)
Total Bilirubin: 0.7 mg/dL (ref 0.2–1.2)
Total Protein: 6.4 g/dL (ref 6.0–8.3)

## 2020-04-30 LAB — LIPID PANEL
Cholesterol: 162 mg/dL (ref 0–200)
HDL: 62.2 mg/dL (ref >39.00)
LDL Cholesterol: 83 mg/dL (ref 0–99)
NonHDL: 99.39
Total CHOL/HDL Ratio: 3
Triglycerides: 80 mg/dL (ref 0.0–149.0)
VLDL: 16 mg/dL (ref 0.0–40.0)

## 2020-04-30 LAB — MICROALBUMIN / CREATININE URINE RATIO
Creatinine,U: 54.4 mg/dL
Microalb Creat Ratio: 1.3 mg/g (ref 0.0–30.0)
Microalb, Ur: <0.7 mg/dL (ref 0.0–1.9)

## 2020-05-03 ENCOUNTER — Ambulatory Visit: Payer: Medicare HMO | Admitting: Internal Medicine

## 2020-05-04 ENCOUNTER — Ambulatory Visit: Payer: Medicare HMO | Admitting: Internal Medicine

## 2020-05-07 ENCOUNTER — Ambulatory Visit: Payer: Medicare HMO | Admitting: Internal Medicine

## 2020-05-10 ENCOUNTER — Encounter: Payer: Self-pay | Admitting: Internal Medicine

## 2020-05-10 ENCOUNTER — Other Ambulatory Visit: Payer: Self-pay

## 2020-05-10 ENCOUNTER — Ambulatory Visit: Payer: Medicare HMO | Admitting: Internal Medicine

## 2020-05-10 VITALS — BP 110/60 | HR 88 | Temp 97.7°F | Ht 65.0 in | Wt 145.2 lb

## 2020-05-10 DIAGNOSIS — B0229 Other postherpetic nervous system involvement: Secondary | ICD-10-CM | POA: Diagnosis not present

## 2020-05-10 DIAGNOSIS — D72825 Bandemia: Secondary | ICD-10-CM

## 2020-05-10 DIAGNOSIS — R7303 Prediabetes: Secondary | ICD-10-CM | POA: Diagnosis not present

## 2020-05-10 DIAGNOSIS — E782 Mixed hyperlipidemia: Secondary | ICD-10-CM | POA: Diagnosis not present

## 2020-05-10 DIAGNOSIS — I10 Essential (primary) hypertension: Secondary | ICD-10-CM

## 2020-05-10 DIAGNOSIS — F418 Other specified anxiety disorders: Secondary | ICD-10-CM

## 2020-05-10 MED ORDER — ROSUVASTATIN CALCIUM 10 MG PO TABS
10.0000 mg | ORAL_TABLET | Freq: Every day | ORAL | 1 refills | Status: DC
Start: 2020-05-10 — End: 2020-10-22

## 2020-05-10 NOTE — Patient Instructions (Addendum)
  Try taking B12 supplements 2500  Mcg daily to see if it helps the neuropathy   Cholesterol is excellent !  Continue

## 2020-05-10 NOTE — Progress Notes (Addendum)
Subjective:  Patient ID: Amber Sullivan, female    DOB: 05-11-1955  Age: 65 y.o. MRN: [id number]  CC: The primary encounter diagnosis was Prediabetes. Diagnoses of Primary hypertension, Mixed hyperlipidemia, Post herpetic neuralgia, Bandemia, and Anxiety about health were also pertinent to this visit. 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  Safety protocols were in place, including screening questions prior to the visit, additional usage of staff PPE, and extensive cleaning of exam room while observing appropriate contact time as indicated for disinfecting solutions.   Post herpetic neuralgia:  She continues to have Left thigh Pain but rates it at a  1/10 most of the time  , bothered by friction of garments .  Using lidocaine gel, tylenol and gabapentin  At night   Living her life. Pain is worse at night .  Using a weighted blanket sleeping better .  Averages 7 hours for the past several decades .   No daytime sleepiness.  Using gabapentin prn and trazodone    Hyperlipidemia:  She has been tolerating crestor without myalgias . ,  LDL down 30 pts    Hypertension: patient checks blood pressure twice weekly at home.  Readings have been for the most part < 140/80 at rest . Patient is following a reduce salt diet most days and is taking  losartan  as prescribed.   Anxiety:  She feels she has returned to her baseline with regard to the anxiety she was experiencing during her episode of shingles pain and the resultant symptoms.  She is sleeping better and plans to attend a piano camp in [place] this summer.       Outpatient Medications Prior to Visit  Medication Sig Dispense Refill  . albuterol (VENTOLIN HFA) 108 (90 Base) MCG/ACT inhaler Inhale 2 puffs into the lungs every 6 (six) hours as needed for wheezing or shortness of  breath. 18 g 2  . ALPRAZolam (XANAX) 0.25 MG tablet Take 1 tablet (0.25 mg total) by mouth 2 (two) times daily as needed for anxiety. 20 tablet 0  . Calcium Carbonate-Vit D-Min (CALTRATE 600+D PLUS MINERALS) 600-800 MG-UNIT TABS Take 1 tablet by mouth daily. Taking 1 tablet every other day    . diclofenac Sodium (VOLTAREN) 1 % GEL Apply 4 g topically 4 (four) times daily. 100 g 2  . diphenhydrAMINE (BENADRYL) 25 MG tablet Take 25 mg by mouth daily.    Marland Kitchen estradiol (ESTRACE VAGINAL) 0.1 MG/GM vaginal cream Place 1 Applicatorful vaginally 3 (three) times a week. 42.5 g 2  . gabapentin (NEURONTIN) 100 MG capsule TAKE 5 CAPSULES BY MOUTH DAILY. (Patient taking differently: Take 100 mg by mouth at bedtime as needed.) 35 capsule 0  . losartan (COZAAR) 100 MG tablet TAKE 1 TABLET DAILY (Patient taking differently: Take 100 mg by mouth daily.) 90 tablet 3  . traZODone (DESYREL) 50 MG tablet TAKE 1/2 TO 1 TABLET BY  MOUTH AT BEDTIME AS NEEDED  FOR SLEEP 90 tablet 3  . zolpidem (AMBIEN) 5 MG tablet Take 1 tablet (5 mg total) by mouth at bedtime as needed for sleep. 30 tablet 5  . carvedilol (COREG) 3.125 MG tablet Take 1 tablet (3.125 mg total) by mouth 2 (two) times daily with a meal. 60 tablet 3  . rosuvastatin (CRESTOR) 10 MG tablet Take 1 tablet (10 mg total) by mouth daily. [place]  tablet 1  . ondansetron (ZOFRAN ODT) 4 MG disintegrating tablet Take 1 tablet (4 mg total) by mouth every 8 (eight) hours as needed for nausea or vomiting. (Patient not taking: Reported on 05/10/2020) 12 tablet 0   No facility-administered medications prior to visit.    Review of Systems;  Patient denies headache, fevers, malaise, unintentional weight loss, skin rash, eye pain, sinus congestion and sinus pain, sore throat, dysphagia,  hemoptysis , cough, dyspnea, wheezing, chest pain, palpitations, orthopnea, edema, abdominal pain, nausea, melena, diarrhea, constipation, flank pain, dysuria, hematuria, urinary  Frequency, nocturia,  numbness, tingling, seizures,  Focal weakness, Loss of consciousness,  Tremor, insomnia, depression, anxiety, and suicidal ideation.      Objective:  BP 110/60   Pulse 88   Temp 97.7 F (36.5 C) (Oral)   Ht 5\' 5"  (1.651 m)   Wt 145 lb 3.2 oz (65.9 kg)   SpO2 98%   BMI 24.16 kg/m   BP Readings from Last 3 Encounters:  05/10/20 110/60  01/03/20 (!) 146/78  12/11/19 (!) 165/70    Wt Readings from Last 3 Encounters:  05/10/20 145 lb 3.2 oz (65.9 kg)  01/03/20 143 lb 6.4 oz (65 kg)  12/14/19 145 lb (65.8 kg)    General appearance: alert, cooperative and appears stated age Ears: normal TM's and external ear canals both ears Throat: lips, mucosa, and tongue normal; teeth and gums normal Neck: no adenopathy, no carotid bruit, supple, symmetrical, trachea midline and thyroid not enlarged, symmetric, no tenderness/mass/nodules Back: symmetric, no curvature. ROM normal. No CVA tenderness. Lungs: clear to auscultation bilaterally Heart: regular rate and rhythm, S1, S2 normal, no murmur, click, rub or gallop Abdomen: soft, non-tender; bowel sounds normal; no masses,  no organomegaly Pulses: 2+ and symmetric Skin: Skin color, texture, turgor normal. No rashes or lesions Lymph nodes: Cervical, supraclavicular, and axillary nodes normal.  Lab Results  Component Value Date   HGBA1C 6.0 11/02/2019    Lab Results  Component Value Date   CREATININE 0.80 04/30/2020   CREATININE 0.74 12/11/2019   CREATININE 0.76 11/02/2019    Lab Results  Component Value Date   WBC 4.2 04/30/2020   HGB 13.6 04/30/2020   HCT 41.2 04/30/2020   PLT 202.0 04/30/2020   GLUCOSE 92 04/30/2020   CHOL 162 04/30/2020   TRIG 80.0 04/30/2020   HDL 62.20 04/30/2020   LDLDIRECT 109.0 10/25/2014   LDLCALC 83 04/30/2020   ALT 13 04/30/2020   AST 14 04/30/2020   NA 139 04/30/2020   K 4.8 04/30/2020   CL 105 04/30/2020   CREATININE 0.80 04/30/2020   BUN 14 04/30/2020   CO2 29 04/30/2020   TSH 1.479  12/11/2019   HGBA1C 6.0 11/02/2019   MICROALBUR <0.7 04/30/2020    DG Chest 2 View  Result Date: 12/11/2019 CLINICAL DATA:  Palpitations. EXAM: CHEST - 2 VIEW COMPARISON:  02/15/2018 FINDINGS: The cardiomediastinal silhouette is unremarkable. There is no evidence of focal airspace disease, pulmonary edema, suspicious pulmonary nodule/mass, pleural effusion, or pneumothorax. No acute bony abnormalities are identified. IMPRESSION: No active cardiopulmonary disease. Electronically Signed   By: Margarette Canada M.D.   On: 12/11/2019 10:19    Assessment & Plan:   Problem List Items Addressed This Visit      Unprioritized   Anxiety about health    She has recovered from the anxiety resulting from her bout with shingles pain, nausea and tachycardia.  She is no longer using alprazolam  With any regularity  Hyperlipidemia    Statin therapy initiated due to evidence of atherosclerotic disease in the aorta.  She is tolerating Crestor with excellent reduction in LDL and normal LFTs.  No changes today.  Lab Results  Component Value Date   CHOL 162 04/30/2020   HDL 62.20 04/30/2020   LDLCALC 83 04/30/2020   LDLDIRECT 109.0 10/25/2014   TRIG 80.0 04/30/2020   CHOLHDL 3 04/30/2020   Lab Results  Component Value Date   ALT 13 04/30/2020   AST 14 04/30/2020   ALKPHOS 56 04/30/2020   BILITOT 0.7 04/30/2020         Relevant Medications   rosuvastatin (CRESTOR) 10 MG tablet   Hypertension    Well controlled on current regimen of   100 mg losartan. Renal function is normal,  no changes today.  Lab Results  Component Value Date   CREATININE 0.80 04/30/2020   Lab Results  Component Value Date   NA 139 04/30/2020   K 4.8 04/30/2020   CL 105 04/30/2020   CO2 29 04/30/2020         Relevant Medications   rosuvastatin (CRESTOR) 10 MG tablet   Other Relevant Orders   Comprehensive metabolic panel   Leukocytosis    demargination suspected from recent use of prednisone . Repeat CBC  is normal.   Lab Results  Component Value Date   WBC 4.2 04/30/2020   HGB 13.6 04/30/2020   HCT 41.2 04/30/2020   MCV 93.5 04/30/2020   PLT 202.0 04/30/2020         Post herpetic neuralgia    Involving left thigh .  pain has improved but has persisted .  Continue gabapentin, tylenol up to 2000 mg daily .  Trial of b12 supplementation       Prediabetes - Primary    Suggested by a1c requested by patient. .  Fasting glucoses have been normal without fail.  She is following a mediterranean style diet and exercising regularly  Lab Results  Component Value Date   HGBA1C 6.0 11/02/2019         Relevant Orders   Comprehensive metabolic panel   Hemoglobin A1c      I provided  30 minutes of  face-to-face time during this encounter reviewing patient's current problems and past surgeries, labs and imaging studies, providing counseling on the above mentioned problems , and coordination  of care .  I have discontinued Izora Gala L. Harland's carvedilol. I am also having her maintain her Caltrate 600+D Plus Minerals, diphenhydrAMINE, estradiol, albuterol, diclofenac Sodium, losartan, ondansetron, ALPRAZolam, gabapentin, zolpidem, traZODone, and rosuvastatin.  Meds ordered this encounter  Medications  . rosuvastatin (CRESTOR) 10 MG tablet    Sig: Take 1 tablet (10 mg total) by mouth daily.    Dispense:  90 tablet    Refill:  1    Medications Discontinued During This Encounter  Medication Reason  . rosuvastatin (CRESTOR) 10 MG tablet Reorder  . carvedilol (COREG) 3.125 MG tablet     Follow-up: Return in about 6 months (around 11/09/2020).   Crecencio Mc, MD

## 2020-05-11 ENCOUNTER — Encounter: Payer: Self-pay | Admitting: Internal Medicine

## 2020-05-11 NOTE — Assessment & Plan Note (Addendum)
Well controlled on current regimen of   100 mg losartan. Renal function is normal,  no changes today.  Lab Results  Component Value Date   CREATININE 0.80 04/30/2020   Lab Results  Component Value Date   NA 139 04/30/2020   K 4.8 04/30/2020   CL 105 04/30/2020   CO2 29 04/30/2020

## 2020-05-11 NOTE — Assessment & Plan Note (Signed)
demargination suspected from recent use of prednisone . Repeat CBC is normal.   Lab Results  Component Value Date   WBC 4.2 04/30/2020   HGB 13.6 04/30/2020   HCT 41.2 04/30/2020   MCV 93.5 04/30/2020   PLT 202.0 04/30/2020

## 2020-05-11 NOTE — Assessment & Plan Note (Signed)
Suggested by a1c requested by patient. .  Fasting glucoses have been normal without fail.  She is following a mediterranean style diet and exercising regularly  Lab Results  Component Value Date   HGBA1C 6.0 11/02/2019

## 2020-05-11 NOTE — Assessment & Plan Note (Signed)
Involving left thigh .  pain has improved but has persisted .  Continue gabapentin, tylenol up to 2000 mg daily .  Trial of b12 supplementation

## 2020-05-11 NOTE — Assessment & Plan Note (Signed)
She has recovered from the anxiety resulting from her bout with shingles pain, nausea and tachycardia.  She is no longer using alprazolam  With any regularity

## 2020-05-11 NOTE — Assessment & Plan Note (Signed)
Statin therapy initiated due to evidence of atherosclerotic disease in the aorta.  She is tolerating Crestor with excellent reduction in LDL and normal LFTs.  No changes today.  Lab Results  Component Value Date   CHOL 162 04/30/2020   HDL 62.20 04/30/2020   LDLCALC 83 04/30/2020   LDLDIRECT 109.0 10/25/2014   TRIG 80.0 04/30/2020   CHOLHDL 3 04/30/2020   Lab Results  Component Value Date   ALT 13 04/30/2020   AST 14 04/30/2020   ALKPHOS 56 04/30/2020   BILITOT 0.7 04/30/2020

## 2020-05-14 NOTE — Addendum Note (Signed)
Addended by: Crecencio Mc on: 05/14/2020 01:02 PM   Modules accepted: Orders

## 2020-05-28 MED ORDER — CELECOXIB 200 MG PO CAPS
ORAL_CAPSULE | ORAL | 1 refills | Status: DC
Start: 2020-05-28 — End: 2021-02-21

## 2020-05-28 MED ORDER — LOSARTAN POTASSIUM 100 MG PO TABS
1.0000 | ORAL_TABLET | Freq: Every day | ORAL | 3 refills | Status: DC
Start: 2020-05-28 — End: 2021-04-10

## 2020-05-28 NOTE — Telephone Encounter (Signed)
I have refilled the Losartan but the Celebrex looks like it has been discontinued.

## 2020-05-29 ENCOUNTER — Ambulatory Visit
Admission: EM | Admit: 2020-05-29 | Discharge: 2020-05-29 | Disposition: A | Discharge status: Home / Self Care | Payer: Medicare HMO

## 2020-05-29 DIAGNOSIS — H6121 Impacted cerumen, right ear: Secondary | ICD-10-CM | POA: Diagnosis not present

## 2020-05-29 NOTE — ED Provider Notes (Signed)
Roderic Palau    CSN: [id number] Arrival date & time: 05/29/20  1305      History   Chief Complaint Chief Complaint  Patient presents with  . Ear Fullness    L    HPI Larisha Sullivan is a 65 y.o. female.   Patient presents with muffled hearing and feeling of fullness in her left ear since this morning.  She denies ear pain, drainage, fever, chills, sore throat, cough, shortness of breath, vomiting, diarrhea, or other symptoms.  Treatment attempted at home with OTC decongestant and Benadryl.  Her medical history includes hypertension.  The history is provided by the patient and medical records.    Past Medical History:  Diagnosis Date  . Fracture of lateral malleolus 2009  . Fracture of lateral malleolus 01/21/2007  . Hyperlipidemia   . Hypertension 2003  . Positive colorectal cancer screening using Cologuard test 04/20/2016  . Positive colorectal cancer screening using DNA-based stool test 04/28/2016   Positive cologuard.  June 2018.  Hyperplastic polyps, July 2018 colonosocpy   10 yr follow up ([facility])   . Tendonitis of elbow, right 07/06/2019    Patient Active Problem List   Diagnosis Date Noted  . Inappropriate sinus node tachycardia 12/16/2019  . Anxiety about health 12/16/2019  . Leukocytosis 12/16/2019  . Post herpetic neuralgia 11/24/2019  . Hyperplastic colon polyp 11/04/2019  . Aortic arch atherosclerosis ([place]) 11/04/2019  . Prediabetes 11/04/2019  . Neoplasm of cheek 04/10/2019  . Screening for breast cancer 04/29/2018  . Travel advice encounter 10/25/2017  . [place] arthritis 04/16/2015  . BPPV (benign paroxysmal positional vertigo) 11/13/2014  . Chronic insomnia 10/27/2014  . Osteoporosis 10/27/2014  . History of abnormal cervical Pap smear 03/30/2014  . Long-term use of high-risk medication 01/30/2014  . Encounter for preventive health examination 10/13/2012  . History of Mohs surgery for squamous cell carcinoma in situ of skin 01/28/2012  .  Screening for colon cancer 01/28/2012  . Hyperlipidemia   . Hypertension     Past Surgical History:  Procedure Laterality Date  . CHOLECYSTECTOMY  2005  . COLONOSCOPY WITH PROPOFOL N/A 08/13/2016   Procedure: COLONOSCOPY WITH PROPOFOL;  Surgeon: Robert Bellow, MD;  Location: [facility] ENDOSCOPY;  Service: Endoscopy;  Laterality: N/A;  . MOHS SURGERY  2007   squamou cell carcinoma rt nasolabial fold  . SQUAMOUS CELL CARCINOMA EXCISION  2007   mole on face    OB History   No obstetric history on file.      Home Medications    Prior to Admission medications   Medication Sig Start Date End Date Taking? Authorizing Provider  acetaminophen (TYLENOL) 500 MG tablet Take 500 mg by mouth every 6 (six) hours as needed.   Yes [provider]  albuterol (VENTOLIN HFA) 108 (90 Base) MCG/ACT inhaler Inhale 2 puffs into the lungs every 6 (six) hours as needed for wheezing or shortness of breath. 05/10/19  Yes Crecencio Mc, MD  Calcium Carbonate-Vit D-Min (CALTRATE 600+D PLUS MINERALS) 600-800 MG-UNIT TABS Take 1 tablet by mouth daily. Taking 1 tablet every other day   Yes [provider]  celecoxib (CELEBREX) 200 MG capsule 2 times daily for one week,  Then once daily thereafter 05/28/20  Yes Crecencio Mc, MD  diclofenac Sodium (VOLTAREN) 1 % GEL Apply 4 g topically 4 (four) times daily. 07/05/19  Yes Crecencio Mc, MD  diphenhydrAMINE (BENADRYL) 25 MG tablet Take 25 mg by mouth daily.   Yes [provider]  estradiol (ESTRACE VAGINAL) 0.1 MG/GM vaginal cream Place 1 Applicatorful vaginally 3 (three) times a week. 05/06/19  Yes Crecencio Mc, MD  gabapentin (NEURONTIN) 100 MG capsule TAKE 5 CAPSULES BY MOUTH DAILY. Patient taking differently: Take 100 mg by mouth at bedtime as needed. 01/05/20  Yes Crecencio Mc, MD  losartan (COZAAR) 100 MG tablet Take 1 tablet (100 mg total) by mouth daily. 05/28/20  Yes Crecencio Mc, MD  rosuvastatin (CRESTOR) 10 MG tablet  Take 1 tablet (10 mg total) by mouth daily. 05/10/20  Yes Crecencio Mc, MD  traZODone (DESYREL) 50 MG tablet TAKE 1/2 TO 1 TABLET BY  MOUTH AT BEDTIME AS NEEDED  FOR SLEEP 03/12/20  Yes Crecencio Mc, MD  zolpidem (AMBIEN) 5 MG tablet Take 1 tablet (5 mg total) by mouth at bedtime as needed for sleep. 02/27/20  Yes Crecencio Mc, MD  ALPRAZolam Duanne Moron) 0.25 MG tablet Take 1 tablet (0.25 mg total) by mouth 2 (two) times daily as needed for anxiety. 12/14/19   Crecencio Mc, MD  ondansetron (ZOFRAN ODT) 4 MG disintegrating tablet Take 1 tablet (4 mg total) by mouth every 8 (eight) hours as needed for nausea or vomiting. Patient not taking: No sig reported 12/11/19   Blake Divine, MD    Family History Family History  Problem Relation Age of Onset  . Hypertension Mother   . Diabetes Mother        type 2  . Stroke Mother 28       massive, with aphasia and paraplegia  . Hypertension Father   . Hyperlipidemia Father   . Heart attack Father        vs PE during hospitalization for chest pain   . Heart disease Father   . Heart attack Maternal Grandfather   . Heart disease Paternal Grandmother        CHF  . Heart disease Paternal Grandfather   . Heart attack Paternal Grandfather   . Heart attack Brother 30  . Breast cancer Neg Hx     Social History Social History   Tobacco Use  . Smoking status: Never Smoker  . Smokeless tobacco: Never Used  Vaping Use  . Vaping Use: Never used  Substance Use Topics  . Alcohol use: Not Currently    Comment: not much  . Drug use: No     Allergies   Ace inhibitors   Review of Systems Review of Systems  Constitutional: Negative for chills and fever.  HENT: Positive for hearing loss. Negative for ear discharge, ear pain and sore throat.   Respiratory: Negative for cough and shortness of breath.   Cardiovascular: Negative for chest pain and palpitations.  Gastrointestinal: Negative for abdominal pain, diarrhea and vomiting.  Skin:  Negative for color change and rash.  All other systems reviewed and are negative.    Physical Exam Triage Vital Signs ED Triage Vitals  Enc Vitals Group     BP 05/29/20 1335 135/79     Pulse --      Resp 05/29/20 1335 18     Temp 05/29/20 1335 98.1 F (36.7 C)     Temp Source 05/29/20 1335 Oral     SpO2 05/29/20 1335 97 %     Weight --      Height --      Head Circumference --      Peak Flow --      Pain Score 05/29/20 1336 0  Pain Loc --      Pain Edu? --      Excl. in [place]? --    No data found.  Updated Vital Signs BP 135/79 (BP Location: Left Arm)   Temp 98.1 F (36.7 C) (Oral)   Resp 18   SpO2 97%   Visual Acuity Right Eye Distance:   Left Eye Distance:   Bilateral Distance:    Right Eye Near:   Left Eye Near:    Bilateral Near:     Physical Exam Vitals and nursing note reviewed.  Constitutional:      General: She is not in acute distress.    Appearance: She is well-developed. She is not ill-appearing.  HENT:     Head: Normocephalic and atraumatic.     Right Ear: There is impacted cerumen.     Left Ear: Tympanic membrane and ear canal normal.     Nose: Nose normal.     Mouth/Throat:     Mouth: Mucous membranes are moist.     Pharynx: Oropharynx is clear.  Eyes:     Conjunctiva/sclera: Conjunctivae normal.  Cardiovascular:     Rate and Rhythm: Normal rate and regular rhythm.     Heart sounds: Normal heart sounds.  Pulmonary:     Effort: Pulmonary effort is normal. No respiratory distress.     Breath sounds: Normal breath sounds.  Abdominal:     Palpations: Abdomen is soft.     Tenderness: There is no abdominal tenderness.  Musculoskeletal:     Cervical back: Neck supple.  Skin:    General: Skin is warm and dry.  Neurological:     General: No focal deficit present.     Mental Status: She is alert and oriented to person, place, and time.     Gait: Gait normal.  Psychiatric:        Mood and Affect: Mood normal.        Behavior: Behavior  normal.      UC Treatments / Results  Labs (all labs ordered are listed, but only abnormal results are displayed) Labs Reviewed - No data to display  EKG   Radiology No results found.  Procedures Procedures (including critical care time)  Medications Ordered in UC Medications - No data to display  Initial Impression / Assessment and Plan / UC Course  I have reviewed the triage vital signs and the nursing notes.  Pertinent labs & imaging results that were available during my care of the patient were reviewed by me and considered in my medical decision making (see chart for details).   Impacted cerumen of the right ear.  Cerumen removed by irrigation.  Patient reports complete relief of her symptoms.  Instructed her to follow-up with her PCP as needed.  She agrees to plan of care.   Final Clinical Impressions(s) / UC Diagnoses   Final diagnoses:  Impacted cerumen of right ear     Discharge Instructions     Follow up with your primary care provider if your symptoms are not improving.        ED Prescriptions    None     PDMP not reviewed this encounter.   Sharion Balloon, NP 05/29/20 [phone]

## 2020-05-29 NOTE — Discharge Instructions (Addendum)
Follow up with your primary care provider if your symptoms are not improving.     

## 2020-05-29 NOTE — ED Triage Notes (Signed)
Pt presents with hearing in L ear being muffled starting this morning.  Took Benadryl and nasal decongestant this morning but no relief.  Denies pain and drainage from ear.  Concerned d/t husband having sudden loss of hearing last year.

## 2020-05-31 ENCOUNTER — Ambulatory Visit: Payer: Medicare HMO | Admitting: Adult Health

## 2020-06-01 ENCOUNTER — Telehealth: Payer: Self-pay | Admitting: Internal Medicine

## 2020-06-01 NOTE — Telephone Encounter (Signed)
Patient called back to let us know [facility] thanks the help she does not need an order now

## 2020-06-01 NOTE — Telephone Encounter (Signed)
Patient would like to get her 2nd shingrix injection. She is unable to find a pharmacy to give it to her, pharmacy's are out. Can she get it here and please put an order in. Patient was advised to call her insurance to see what she can do since local pharmacy's are out of the injection.

## 2020-06-01 NOTE — Telephone Encounter (Signed)
[facility].

## 2020-06-12 ENCOUNTER — Ambulatory Visit (INDEPENDENT_AMBULATORY_CARE_PROVIDER_SITE_OTHER): Payer: Medicare HMO

## 2020-06-12 VITALS — Ht 65.0 in | Wt 145.0 lb

## 2020-06-12 DIAGNOSIS — Z0000 Encounter for general adult medical examination without abnormal findings: Secondary | ICD-10-CM

## 2020-06-12 NOTE — Progress Notes (Addendum)
Subjective:   Amber Sullivan is a 65 y.o. female who presents for an Initial Medicare Annual Wellness Visit.  Review of Systems    No ROS.  Medicare Wellness Virtual Visit.  Visual/audio telehealth visit, UTA vital signs.   See social history for additional risk factors.   Cardiac Risk Factors include: advanced age (>57men, >47 women)     Objective:    Today's Vitals   06/12/20 1450  Weight: 145 lb (65.8 kg)  Height: 5\' 5"  (1.651 m)   Body mass index is 24.13 kg/m.  Advanced Directives 06/12/2020 12/11/2019 11/26/2019 08/13/2016 05/19/2015 11/21/2014  Does Patient Have a Medical Advance Directive? No No No No No No  Does patient want to make changes to medical advance directive? - - - - - No - Patient declined  Would patient like information on creating a medical advance directive? No - Patient declined - - - No - patient declined information -    Current Medications (verified) Outpatient Encounter Medications as of 06/12/2020  Medication Sig  . acetaminophen (TYLENOL) 500 MG tablet Take 500 mg by mouth every 6 (six) hours as needed.  Marland Kitchen albuterol (VENTOLIN HFA) 108 (90 Base) MCG/ACT inhaler Inhale 2 puffs into the lungs every 6 (six) hours as needed for wheezing or shortness of breath.  . ALPRAZolam (XANAX) 0.25 MG tablet Take 1 tablet (0.25 mg total) by mouth 2 (two) times daily as needed for anxiety.  . Calcium Carbonate-Vit D-Min (CALTRATE 600+D PLUS MINERALS) 600-800 MG-UNIT TABS Take 1 tablet by mouth daily. Taking 1 tablet every other day  . celecoxib (CELEBREX) 200 MG capsule 2 times daily for one week,  Then once daily thereafter  . diclofenac Sodium (VOLTAREN) 1 % GEL Apply 4 g topically 4 (four) times daily.  . diphenhydrAMINE (BENADRYL) 25 MG tablet Take 25 mg by mouth daily.  Marland Kitchen estradiol (ESTRACE VAGINAL) 0.1 MG/GM vaginal cream Place 1 Applicatorful vaginally 3 (three) times a week.  . gabapentin (NEURONTIN) 100 MG capsule TAKE 5 CAPSULES BY MOUTH DAILY.  (Patient taking differently: Take 100 mg by mouth at bedtime as needed.)  . losartan (COZAAR) 100 MG tablet Take 1 tablet (100 mg total) by mouth daily.  . rosuvastatin (CRESTOR) 10 MG tablet Take 1 tablet (10 mg total) by mouth daily.  . traZODone (DESYREL) 50 MG tablet TAKE 1/2 TO 1 TABLET BY  MOUTH AT BEDTIME AS NEEDED  FOR SLEEP  . zolpidem (AMBIEN) 5 MG tablet Take 1 tablet (5 mg total) by mouth at bedtime as needed for sleep.  . [DISCONTINUED] ondansetron (ZOFRAN ODT) 4 MG disintegrating tablet Take 1 tablet (4 mg total) by mouth every 8 (eight) hours as needed for nausea or vomiting. (Patient not taking: No sig reported)   No facility-administered encounter medications on file as of 06/12/2020.    Allergies (verified) Ace inhibitors   History: Past Medical History:  Diagnosis Date  . Fracture of lateral malleolus 2009  . Fracture of lateral malleolus 01/21/2007  . Hyperlipidemia   . Hypertension 2003  . Positive colorectal cancer screening using Cologuard test 04/20/2016  . Positive colorectal cancer screening using DNA-based stool test 04/28/2016   Positive cologuard.  June 2018.  Hyperplastic polyps, July 2018 colonosocpy   10 yr follow up ([facility])   . Tendonitis of elbow, right 07/06/2019   Past Surgical History:  Procedure Laterality Date  . CHOLECYSTECTOMY  2005  . COLONOSCOPY WITH PROPOFOL N/A 08/13/2016   Procedure: COLONOSCOPY WITH PROPOFOL;  Surgeon: Hervey Ard  W, MD;  Location: [facility] ENDOSCOPY;  Service: Endoscopy;  Laterality: N/A;  . MOHS SURGERY  2007   squamou cell carcinoma rt nasolabial fold  . SQUAMOUS CELL CARCINOMA EXCISION  2007   mole on face   Family History  Problem Relation Age of Onset  . Hypertension Mother   . Diabetes Mother        type 2  . Stroke Mother 61       massive, with aphasia and paraplegia  . Hypertension Father   . Hyperlipidemia Father   . Heart attack Father        vs PE during hospitalization for chest pain   . Heart  disease Father   . Heart attack Maternal Grandfather   . Heart disease Paternal Grandmother        CHF  . Heart disease Paternal Grandfather   . Heart attack Paternal Grandfather   . Heart attack Brother 63  . Breast cancer Neg Hx    Social History   Socioeconomic History  . Marital status: Married    Spouse name: Not on file  . Number of children: Not on file  . Years of education: Not on file  . Highest education level: Not on file  Occupational History  . Not on file  Tobacco Use  . Smoking status: Never Smoker  . Smokeless tobacco: Never Used  Vaping Use  . Vaping Use: Never used  Substance and Sexual Activity  . Alcohol use: Not Currently    Comment: not much  . Drug use: No  . Sexual activity: Not on file  Other Topics Concern  . Not on file  Social History Narrative  . Not on file   Social Determinants of Health   Financial Resource Strain: Not on file  Food Insecurity: Not on file  Transportation Needs: Not on file  Physical Activity: Not on file  Stress: Not on file  Social Connections: Not on file    Tobacco Counseling Counseling given: Not Answered   Clinical Intake:  Pre-visit preparation completed: Yes        Diabetes: No  How often do you need to have someone help you when you read instructions, pamphlets, or other written materials from your doctor or pharmacy?: 1 - Never    Interpreter Needed?: No      Activities of Daily Living In your present state of health, do you have any difficulty performing the following activities: 06/12/2020  Hearing? N  Vision? N  Difficulty concentrating or making decisions? N  Walking or climbing stairs? N  Dressing or bathing? N  Doing errands, shopping? N  Preparing Food and eating ? N  Using the Toilet? N  In the past six months, have you accidently leaked urine? N  Do you have problems with loss of bowel control? N  Managing your Medications? N  Managing your Finances? N  Housekeeping or  managing your Housekeeping? N  Some recent data might be hidden    Patient Care Team: Crecencio Mc, MD as PCP - General (Internal Medicine) Crecencio Mc, MD (Internal Medicine) Bary Castilla Forest Gleason, MD (General Surgery)  Indicate any recent Medical Services you may have received from other than [facility] providers in the past year (date may be approximate).     Assessment:   This is a routine wellness examination for [place].  I connected with Izora Gala today by telephone and verified that I am speaking with the correct person using two identifiers. Location patient: home  Location provider: work Persons participating in the virtual visit: patient, Marine scientist.    I discussed the limitations, risks, security and privacy concerns of performing an evaluation and management service by telephone and the availability of in person appointments. The patient expressed understanding and verbally consented to this telephonic visit.    Interactive audio and video telecommunications were attempted between this provider and patient, however failed, due to patient having technical difficulties OR patient did not have access to video capability.  We continued and completed visit with audio only.  Some vital signs may be absent or patient reported.   Hearing/Vision screen  Hearing Screening   125Hz  250Hz  500Hz  1000Hz  2000Hz  3000Hz  4000Hz  6000Hz  8000Hz   Right ear:           Left ear:           Comments: Patient is able to hear conversational tones without difficulty.  No issues reported.  Vision Screening Comments: Visual acuity not assessed, virtual visit.  They have seen their ophthalmologist in the last 12 months.     Dietary issues and exercise activities discussed: Current Exercise Habits: Home exercise routine, Intensity: Moderate  Healthy diet Good water intake  Goals Addressed            This Visit's Progress   . Maintain Healthy Lifestyle       Stay active  Healthy diet         Depression Screen PHQ 2/9 Scores 06/12/2020 05/10/2020 01/03/2020 11/04/2019 11/03/2018 04/08/2017  PHQ - 2 Score 0 0 0 0 0 0  PHQ- 9 Score 0 1 - 1 0 0    Fall Risk Fall Risk  06/12/2020 01/03/2020 12/14/2019 11/23/2019 11/04/2019  Falls in the past year? 0 0 0 0 0  Number falls in past yr: 0 0 - - -  Injury with Fall? 0 0 - - -  Follow up Falls evaluation completed Falls evaluation completed Falls evaluation completed Falls evaluation completed Falls evaluation completed    ASSISTIVE DEVICES UTILIZED TO PREVENT FALLS: Life alert? No  Use of a cane, walker or w/c? No   TIMED UP AND GO: Was the test performed? No .   Cognitive Function:  Patient is alert and oriented x3.  Denies difficulty focusing, making decisions, memory loss.  Enjoys playing the piano and other brain health stimulating activities.  MMSE/6CIT deferred. Normal by direct communication/observation.       Immunizations Immunization History  Administered Date(s) Administered  . Fluad Quad(high Dose 65+) 11/03/2018  . Influenza,inj,Quad PF,6+ Mos 10/12/2012, 10/25/2014  . Influenza-Unspecified 11/16/2019  . PFIZER Comirnaty(Gray Top)Covid-19 Tri-Sucrose Vaccine 05/24/2020  . PFIZER(Purple Top)SARS-COV-2 Vaccination 02/14/2019, 03/09/2019, 10/31/2019  . Pneumococcal Conjugate-13 11/04/2019  . Pneumococcal Polysaccharide-23 03/29/2004  . Tdap 10/14/2007, 11/03/2018  . Zoster Recombinat (Shingrix) 03/09/2020    Health Maintenance There are no preventive care reminders to display for this patient. Health Maintenance  Topic Date Due  . INFLUENZA VACCINE  08/20/2020  . MAMMOGRAM  10/17/2020  . PNA vac Low Risk Adult (2 of 2 - PPSV23) 11/03/2020  . COLONOSCOPY (Pts 45-69yrs Insurance coverage will need to be confirmed)  08/14/2026  . TETANUS/TDAP  11/02/2028  . DEXA SCAN  Completed  . COVID-19 Vaccine  Completed  . Hepatitis C Screening  Completed  . HPV VACCINES  Aged Out   Lung Cancer Screening: (Low  Dose CT Chest recommended if Age 16-80 years, 30 pack-year currently smoking OR have quit w/in 15years.) does not qualify.   Vision Screening: Recommended  annual ophthalmology exams for early detection of glaucoma and other disorders of the eye. Is the patient up to date with their annual eye exam?  Yes   Dental Screening: Recommended annual dental exams for proper oral hygiene. Visits every 6 months.   Community Resource Referral / Chronic Care Management: CRR required this visit?  No   CCM required this visit?  No      Plan:   Keep all routine maintenance appointments.   I have personally reviewed and noted the following in the patient's chart:   . Medical and social history . Use of alcohol, tobacco or illicit drugs  . Current medications and supplements including opioid prescriptions. Patient is not currently taking opioid prescriptions. . Functional ability and status . Nutritional status . Physical activity . Advanced directives . List of other physicians . Hospitalizations, surgeries, and ER visits in previous 12 months . Vitals . Screenings to include cognitive, depression, and falls . Referrals and appointments  In addition, I have reviewed and discussed with patient certain preventive protocols, quality metrics, and best practice recommendations. A written personalized care plan for preventive services as well as general preventive health recommendations were provided to patient.     OBrien-Blaney, Balraj Brayfield L, LPN   [id number]/[id number]     I have reviewed the above information and agree with above.   Deborra Medina, MD

## 2020-06-12 NOTE — Patient Instructions (Addendum)
Amber Sullivan , Thank you for taking time to come for your Medicare Wellness Visit. I appreciate your ongoing commitment to your health goals. Please review the following plan we discussed and let me know if I can assist you in the future.   These are the goals we discussed: Goals    . Maintain Healthy Lifestyle     Stay active  Healthy diet         This is a list of the screening recommended for you and due dates:  Health Maintenance  Topic Date Due  . Flu Shot  08/20/2020  . Mammogram  10/17/2020  . Pneumonia vaccines (2 of 2 - PPSV23) 11/03/2020  . Colon Cancer Screening  08/14/2026  . Tetanus Vaccine  11/02/2028  . DEXA scan (bone density measurement)  Completed  . COVID-19 Vaccine  Completed  . Hepatitis C Screening: USPSTF Recommendation to screen - Ages 49-79 yo.  Completed  . HPV Vaccine  Aged Out    Immunizations Immunization History  Administered Date(s) Administered  . Fluad Quad(high Dose 65+) 11/03/2018  . Influenza,inj,Quad PF,6+ Mos 10/12/2012, 10/25/2014  . Influenza-Unspecified 11/16/2019  . PFIZER Comirnaty(Gray Top)Covid-19 Tri-Sucrose Vaccine 05/24/2020  . PFIZER(Purple Top)SARS-COV-2 Vaccination 02/14/2019, 03/09/2019, 10/31/2019  . Pneumococcal Conjugate-13 11/04/2019  . Pneumococcal Polysaccharide-23 03/29/2004  . Tdap 10/14/2007, 11/03/2018  . Zoster Recombinat (Shingrix) 03/09/2020    Advanced directives: not yet completed  Conditions/risks identified: none new  Follow up in one year for your annual wellness visit    Preventive Care 67 Years and Older, Female Preventive care refers to lifestyle choices and visits with your health care provider that can promote health and wellness. What does preventive care include?  A yearly physical exam. This is also called an annual well check.  Dental exams once or twice a year.  Routine eye exams. Ask your health care provider how often you should have your eyes checked.  Personal lifestyle  choices, including:  Daily care of your teeth and gums.  Regular physical activity.  Eating a healthy diet.  Avoiding tobacco and drug use.  Limiting alcohol use.  Practicing safe sex.  Taking low-dose aspirin every day.  Taking vitamin and mineral supplements as recommended by your health care provider. What happens during an annual well check? The services and screenings done by your health care provider during your annual well check will depend on your age, overall health, lifestyle risk factors, and family history of disease. Counseling  Your health care provider may ask you questions about your:  Alcohol use.  Tobacco use.  Drug use.  Emotional well-being.  Home and relationship well-being.  Sexual activity.  Eating habits.  History of falls.  Memory and ability to understand (cognition).  Work and work Statistician.  Reproductive health. Screening  You may have the following tests or measurements:  Height, weight, and BMI.  Blood pressure.  Lipid and cholesterol levels. These may be checked every 5 years, or more frequently if you are over 53 years old.  Skin check.  Lung cancer screening. You may have this screening every year starting at age 67 if you have a 30-pack-year history of smoking and currently smoke or have quit within the past 15 years.  Fecal occult blood test (FOBT) of the stool. You may have this test every year starting at age 67.  Flexible sigmoidoscopy or colonoscopy. You may have a sigmoidoscopy every 5 years or a colonoscopy every 10 years starting at age 91.  Hepatitis C blood test.  Hepatitis B blood test.  Sexually transmitted disease (STD) testing.  Diabetes screening. This is done by checking your blood sugar (glucose) after you have not eaten for a while (fasting). You may have this done every 1-3 years.  Bone density scan. This is done to screen for osteoporosis. You may have this done starting at age  67.  Mammogram. This may be done every 1-2 years. Talk to your health care provider about how often you should have regular mammograms. Talk with your health care provider about your test results, treatment options, and if necessary, the need for more tests. Vaccines  Your health care provider may recommend certain vaccines, such as:  Influenza vaccine. This is recommended every year.  Tetanus, diphtheria, and acellular pertussis (Tdap, Td) vaccine. You may need a Td booster every 10 years.  Zoster vaccine. You may need this after age 67.  Pneumococcal 13-valent conjugate (PCV13) vaccine. One dose is recommended after age 67.  Pneumococcal polysaccharide (PPSV23) vaccine. One dose is recommended after age 67. Talk to your health care provider about which screenings and vaccines you need and how often you need them. This information is not intended to replace advice given to you by your health care provider. Make sure you discuss any questions you have with your health care provider. Document Released: 02/02/2015 Document Revised: 09/26/2015 Document Reviewed: 11/07/2014 Elsevier Interactive Patient Education  2017 [employer] Prevention in the Home Falls can cause injuries. They can happen to people of all ages. There are many things you can do to make your home safe and to help prevent falls. What can I do on the outside of my home?  Regularly fix the edges of walkways and driveways and fix any cracks.  Remove anything that might make you trip as you walk through a door, such as a raised step or threshold.  Trim any bushes or trees on the path to your home.  Use bright outdoor lighting.  Clear any walking paths of anything that might make someone trip, such as rocks or tools.  Regularly check to see if handrails are loose or broken. Make sure that both sides of any steps have handrails.  Any raised decks and porches should have guardrails on the edges.  Have any  leaves, snow, or ice cleared regularly.  Use sand or salt on walking paths during winter.  Clean up any spills in your garage right away. This includes oil or grease spills. What can I do in the bathroom?  Use night lights.  Install grab bars by the toilet and in the tub and shower. Do not use towel bars as grab bars.  Use non-skid mats or decals in the tub or shower.  If you need to sit down in the shower, use a plastic, non-slip stool.  Keep the floor dry. Clean up any water that spills on the floor as soon as it happens.  Remove soap buildup in the tub or shower regularly.  Attach bath mats securely with double-sided non-slip rug tape.  Do not have throw rugs and other things on the floor that can make you trip. What can I do in the bedroom?  Use night lights.  Make sure that you have a light by your bed that is easy to reach.  Do not use any sheets or blankets that are too big for your bed. They should not hang down onto the floor.  Have a firm chair that has side arms. You can use this  for support while you get dressed.  Do not have throw rugs and other things on the floor that can make you trip. What can I do in the kitchen?  Clean up any spills right away.  Avoid walking on wet floors.  Keep items that you use a lot in easy-to-reach places.  If you need to reach something above you, use a strong step stool that has a grab bar.  Keep electrical cords out of the way.  Do not use floor polish or wax that makes floors slippery. If you must use wax, use non-skid floor wax.  Do not have throw rugs and other things on the floor that can make you trip. What can I do with my stairs?  Do not leave any items on the stairs.  Make sure that there are handrails on both sides of the stairs and use them. Fix handrails that are broken or loose. Make sure that handrails are as long as the stairways.  Check any carpeting to make sure that it is firmly attached to the stairs.  Fix any carpet that is loose or worn.  Avoid having throw rugs at the top or bottom of the stairs. If you do have throw rugs, attach them to the floor with carpet tape.  Make sure that you have a light switch at the top of the stairs and the bottom of the stairs. If you do not have them, ask someone to add them for you. What else can I do to help prevent falls?  Wear shoes that:  Do not have high heels.  Have rubber bottoms.  Are comfortable and fit you well.  Are closed at the toe. Do not wear sandals.  If you use a stepladder:  Make sure that it is fully opened. Do not climb a closed stepladder.  Make sure that both sides of the stepladder are locked into place.  Ask someone to hold it for you, if possible.  Clearly mark and make sure that you can see:  Any grab bars or handrails.  First and last steps.  Where the edge of each step is.  Use tools that help you move around (mobility aids) if they are needed. These include:  Canes.  Walkers.  Scooters.  Crutches.  Turn on the lights when you go into a dark area. Replace any light bulbs as soon as they burn out.  Set up your furniture so you have a clear path. Avoid moving your furniture around.  If any of your floors are uneven, fix them.  If there are any pets around you, be aware of where they are.  Review your medicines with your doctor. Some medicines can make you feel dizzy. This can increase your chance of falling. Ask your doctor what other things that you can do to help prevent falls. This information is not intended to replace advice given to you by your health care provider. Make sure you discuss any questions you have with your health care provider. Document Released: 11/02/2008 Document Revised: 06/14/2015 Document Reviewed: 02/10/2014 Elsevier Interactive Patient Education  2017 [employer].

## 2020-07-10 ENCOUNTER — Telehealth: Payer: Self-pay | Admitting: Internal Medicine

## 2020-07-10 NOTE — Telephone Encounter (Signed)
PT called to advise she wanted to be seen for a skin rash on her face. She states its just been redness to it and no pain,itching or swelling. PT was scheduled to be seen virtually for Amber Sullivan at 4:30pm on Thursday. PT has been to the Urgent Care but it did not help.

## 2020-07-12 ENCOUNTER — Telehealth: Payer: Medicare HMO | Admitting: Adult Health

## 2020-07-18 ENCOUNTER — Other Ambulatory Visit: Payer: Self-pay | Admitting: Internal Medicine

## 2020-07-18 MED ORDER — MOLNUPIRAVIR EUA 200MG CAPSULE: 4.0000 | ORAL_CAPSULE | Freq: Two times a day (BID) | ORAL | 0 refills | Status: AC

## 2020-07-31 ENCOUNTER — Encounter: Payer: Self-pay | Admitting: Family

## 2020-07-31 ENCOUNTER — Other Ambulatory Visit: Payer: Self-pay

## 2020-07-31 ENCOUNTER — Ambulatory Visit (INDEPENDENT_AMBULATORY_CARE_PROVIDER_SITE_OTHER): Payer: Medicare HMO | Admitting: Family

## 2020-07-31 VITALS — BP 140/64 | HR 96 | Temp 97.5°F | Ht 65.0 in | Wt 142.2 lb

## 2020-07-31 DIAGNOSIS — H938X1 Other specified disorders of right ear: Secondary | ICD-10-CM | POA: Diagnosis not present

## 2020-07-31 NOTE — Patient Instructions (Signed)
One time decongestant prior to flying to [place].  Chew gum while on the airplane

## 2020-07-31 NOTE — Progress Notes (Signed)
Acute Office Visit  Subjective:    Patient ID: Amber Sullivan, female    DOB: June 21, 1955, 65 y.o.   MRN: [id number]  Chief Complaint  Patient presents with  . Ear Fullness    Pt woke up with the feeling of her right ear being full.     HPI Patient is in today with c/o feelings of fullness in her right ear yesterday that has resolved today. She reports swimming 2-3 times per week. No pain. No nausea/vomiting. Admits to a brief episode of dizziness that she has not felt since. She denies any sneezing, coughing or congestion. She will be traveling to [place] Saturday and wanted to be sure she was ok.   Past Medical History:  Diagnosis Date  . Fracture of lateral malleolus 2009  . Fracture of lateral malleolus 01/21/2007  . Hyperlipidemia   . Hypertension 2003  . Positive colorectal cancer screening using Cologuard test 04/20/2016  . Positive colorectal cancer screening using DNA-based stool test 04/28/2016   Positive cologuard.  June 2018.  Hyperplastic polyps, July 2018 colonosocpy   10 yr follow up ([facility])   . Tendonitis of elbow, right 07/06/2019    Past Surgical History:  Procedure Laterality Date  . CHOLECYSTECTOMY  2005  . COLONOSCOPY WITH PROPOFOL N/A 08/13/2016   Procedure: COLONOSCOPY WITH PROPOFOL;  Surgeon: Robert Bellow, MD;  Location: [facility] ENDOSCOPY;  Service: Endoscopy;  Laterality: N/A;  . MOHS SURGERY  2007   squamou cell carcinoma rt nasolabial fold  . SQUAMOUS CELL CARCINOMA EXCISION  2007   mole on face    Family History  Problem Relation Age of Onset  . Hypertension Mother   . Diabetes Mother        type 2  . Stroke Mother 15       massive, with aphasia and paraplegia  . Hypertension Father   . Hyperlipidemia Father   . Heart attack Father        vs PE during hospitalization for chest pain   . Heart disease Father   . Heart attack Maternal Grandfather   . Heart disease Paternal Grandmother        CHF  . Heart disease Paternal Grandfather    . Heart attack Paternal Grandfather   . Heart attack Brother 81  . Breast cancer Neg Hx     Social History   Socioeconomic History  . Marital status: Married    Spouse name: Not on file  . Number of children: Not on file  . Years of education: Not on file  . Highest education level: Not on file  Occupational History  . Not on file  Tobacco Use  . Smoking status: Never  . Smokeless tobacco: Never  Vaping Use  . Vaping Use: Never used  Substance and Sexual Activity  . Alcohol use: Not Currently    Comment: not much  . Drug use: No  . Sexual activity: Not on file  Other Topics Concern  . Not on file  Social History Narrative  . Not on file   Social Determinants of Health   Financial Resource Strain: Not on file  Food Insecurity: Not on file  Transportation Needs: Not on file  Physical Activity: Not on file  Stress: Not on file  Social Connections: Not on file  Intimate Partner Violence: Not on file    Outpatient Medications Prior to Visit  Medication Sig Dispense Refill  . acetaminophen (TYLENOL) 500 MG tablet Take 500 mg by mouth every  6 (six) hours as needed.    Marland Kitchen albuterol (VENTOLIN HFA) 108 (90 Base) MCG/ACT inhaler Inhale 2 puffs into the lungs every 6 (six) hours as needed for wheezing or shortness of breath. 18 g 2  . ALPRAZolam (XANAX) 0.25 MG tablet Take 1 tablet (0.25 mg total) by mouth 2 (two) times daily as needed for anxiety. 20 tablet 0  . Calcium Carbonate-Vit D-Min (CALTRATE 600+D PLUS MINERALS) 600-800 MG-UNIT TABS Take 1 tablet by mouth daily. Taking 1 tablet every other day    . celecoxib (CELEBREX) 200 MG capsule 2 times daily for one week,  Then once daily thereafter 180 capsule 1  . diclofenac Sodium (VOLTAREN) 1 % GEL Apply 4 g topically 4 (four) times daily. 100 g 2  . diphenhydrAMINE (BENADRYL) 25 MG tablet Take 25 mg by mouth daily.    Marland Kitchen estradiol (ESTRACE VAGINAL) 0.1 MG/GM vaginal cream Place 1 Applicatorful vaginally 3 (three) times a  week. 42.5 g 2  . losartan (COZAAR) 100 MG tablet Take 1 tablet (100 mg total) by mouth daily. 90 tablet 3  . rosuvastatin (CRESTOR) 10 MG tablet Take 1 tablet (10 mg total) by mouth daily. 90 tablet 1  . traZODone (DESYREL) 50 MG tablet TAKE 1/2 TO 1 TABLET BY  MOUTH AT BEDTIME AS NEEDED  FOR SLEEP 90 tablet 3  . zolpidem (AMBIEN) 5 MG tablet Take 1 tablet (5 mg total) by mouth at bedtime as needed for sleep. 30 tablet 5  . gabapentin (NEURONTIN) 100 MG capsule TAKE 5 CAPSULES BY MOUTH DAILY. (Patient not taking: Reported on 07/31/2020) 35 capsule 0   No facility-administered medications prior to visit.    Allergies  Allergen Reactions  . Ace Inhibitors Cough    Review of Systems  HENT:  Negative for ear discharge, ear pain and hearing loss.        Right ear fullness.   Respiratory: Negative.    Cardiovascular: Negative.   Endocrine: Negative.   Genitourinary: Negative.   Musculoskeletal: Negative.   Skin: Negative.   Neurological: Negative.   Psychiatric/Behavioral: Negative.    All other systems reviewed and are negative.     Objective:    Physical Exam Vitals and nursing note reviewed.  Constitutional:      Appearance: Normal appearance.  HENT:     Right Ear: Tympanic membrane, ear canal and external ear normal. There is no impacted cerumen.     Left Ear: Tympanic membrane, ear canal and external ear normal. There is no impacted cerumen.  Cardiovascular:     Rate and Rhythm: Normal rate and regular rhythm.  Pulmonary:     Effort: Pulmonary effort is normal.     Breath sounds: Normal breath sounds.  Abdominal:     General: Abdomen is flat.     Palpations: Abdomen is soft.  Musculoskeletal:        General: Normal range of motion.     Cervical back: Normal range of motion and neck supple.  Skin:    General: Skin is warm and dry.  Neurological:     General: No focal deficit present.     Mental Status: She is alert and oriented to person, place, and time.   Psychiatric:        Mood and Affect: Mood normal.        Behavior: Behavior normal.   BP 140/64 (BP Location: Left Arm, Patient Position: Sitting)   Pulse 96   Temp (!) 97.5 F (36.4 C)   Ht  5\' 5"  (1.651 m)   Wt 142 lb 3.2 oz (64.5 kg)   SpO2 97%   BMI 23.66 kg/m  Wt Readings from Last 3 Encounters:  07/31/20 142 lb 3.2 oz (64.5 kg)  06/12/20 145 lb (65.8 kg)  05/10/20 145 lb 3.2 oz (65.9 kg)    There are no preventive care reminders to display for this patient.  There are no preventive care reminders to display for this patient.   Lab Results  Component Value Date   TSH 1.479 12/11/2019   Lab Results  Component Value Date   WBC 4.2 04/30/2020   HGB 13.6 04/30/2020   HCT 41.2 04/30/2020   MCV 93.5 04/30/2020   PLT 202.0 04/30/2020   Lab Results  Component Value Date   NA 139 04/30/2020   K 4.8 04/30/2020   CO2 29 04/30/2020   GLUCOSE 92 04/30/2020   BUN 14 04/30/2020   CREATININE 0.80 04/30/2020   BILITOT 0.7 04/30/2020   ALKPHOS 56 04/30/2020   AST 14 04/30/2020   ALT 13 04/30/2020   PROT 6.4 04/30/2020   ALBUMIN 3.9 04/30/2020   CALCIUM 9.2 04/30/2020   ANIONGAP 10 12/11/2019   GFR 76.71 04/30/2020   Lab Results  Component Value Date   CHOL 162 04/30/2020   Lab Results  Component Value Date   HDL 62.20 04/30/2020   Lab Results  Component Value Date   LDLCALC 83 04/30/2020   Lab Results  Component Value Date   TRIG 80.0 04/30/2020   Lab Results  Component Value Date   CHOLHDL 3 04/30/2020   Lab Results  Component Value Date   HGBA1C 6.0 11/02/2019       Assessment & Plan:   Problem List Items Addressed This Visit   None Visit Diagnoses     Ear fullness, right    -  Primary       Plan: Patient advised that she likely was experiencing a eustachian tube dysfunction. Her ear is completely unremarkable on exam today. Advised chewing gum while on the plan or a one time dose of a decongestant if she needs it on Saturday.     Kennyth Arnold, FNP

## 2020-08-03 ENCOUNTER — Other Ambulatory Visit: Payer: Self-pay

## 2020-08-03 ENCOUNTER — Ambulatory Visit
Admission: EM | Admit: 2020-08-03 | Discharge: 2020-08-03 | Disposition: A | Discharge status: Home / Self Care | Payer: Medicare HMO | Attending: Emergency Medicine | Admitting: Emergency Medicine

## 2020-08-03 ENCOUNTER — Encounter: Payer: Self-pay | Admitting: Emergency Medicine

## 2020-08-03 ENCOUNTER — Telehealth: Payer: Medicare HMO | Admitting: Emergency Medicine

## 2020-08-03 DIAGNOSIS — I10 Essential (primary) hypertension: Secondary | ICD-10-CM

## 2020-08-03 DIAGNOSIS — H6981 Other specified disorders of Eustachian tube, right ear: Secondary | ICD-10-CM

## 2020-08-03 DIAGNOSIS — H938X1 Other specified disorders of right ear: Secondary | ICD-10-CM

## 2020-08-03 DIAGNOSIS — F418 Other specified anxiety disorders: Secondary | ICD-10-CM

## 2020-08-03 MED ORDER — AZELASTINE HCL 0.1 % NA SOLN: 2.0000 | Freq: Two times a day (BID) | NASAL | 0 refills | Status: DC

## 2020-08-03 MED ORDER — PREDNISONE 10 MG PO TABS: ORAL_TABLET | ORAL | 0 refills | Status: DC

## 2020-08-03 NOTE — ED Provider Notes (Signed)
CHIEF COMPLAINT:   Chief Complaint  Patient presents with   Hypertension   Ear Fullness     SUBJECTIVE/HPI:   Hypertension  Ear Fullness  A very pleasant 65 y.o.Female presents today with concerns related to ear fullness for the last 3 days.  Patient reports a high-pitched noise to the right ear.  She also reports episodes of vertigo a few days ago which has now subsided.  Patient reports that these episodes were induced by different head positions.  Patient states that she completed the Epley maneuver on Tuesday and Thursday which helped.  Patient reports that she is flying out of the country tomorrow and has felt a little anxious given her recent symptoms.  She reports having alprazolam at home for anxiety for which she has not taken.  She also reports finding meclizine at home and has not used this for her vertigo episodes.  She also reports a blood pressure of 169/88 this morning.  She does not report any chest pain, shortness of breath, fever, ear pain, headache or additional symptoms today.   has a past medical history of Fracture of lateral malleolus (2009), Fracture of lateral malleolus (01/21/2007), Hyperlipidemia, Hypertension (2003), Positive colorectal cancer screening using Cologuard test (04/20/2016), Positive colorectal cancer screening using DNA-based stool test (04/28/2016), and Tendonitis of elbow, right (07/06/2019). ROS:  Review of Systems See Subjective/HPI Medications, Allergies and Problem List personally reviewed in Epic today OBJECTIVE:   Vitals:   08/03/20 1030  BP: (!) 145/84  Pulse: 100  Resp: 18  Temp: 98.5 F (36.9 C)  SpO2: 97%    Physical Exam   General: Appears well-developed and well-nourished. No acute distress.  HEENT Head: Normocephalic and atraumatic.  Ears: Hearing grossly intact, no drainage or visible deformity.  Nose: No nasal deviation or rhinorrhea.  Mouth/Throat: No stridor or tracheal deviation.  Eyes: Conjunctivae and EOM are normal.  No eye drainage or scleral icterus bilaterally.  Neck: Normal range of motion, neck is supple. No cervical, tonsillar or submandibular lymph nodes palpated.  Cardiovascular: Normal rate . Regular rhythm; no murmurs, gallops, or rubs.  Pulm/Chest: No respiratory distress. Breath sounds normal bilaterally without wheezes, rhonchi, or rales.  Neurological: Alert and oriented to person, place, and time.  Skin: Skin is warm and dry.  Psychiatric: Normal mood, affect, behavior, and thought content.   Vital signs and nursing note reviewed.   Patient stable and cooperative with examination.  LABS/X-RAYS/EKG/MEDS:   No results found for any visits on 08/03/20.  MEDICAL DECISION MAKING:   Patient presents with concerns related to ear fullness for the last 3 days.  Patient reports a high-pitched noise to the right ear.  She also reports episodes of vertigo a few days ago which has now subsided.  Patient reports that these episodes were induced by different head positions.  Patient states that she completed the Epley maneuver on Tuesday and Thursday which helped.  Patient reports that she is flying out of the country tomorrow and has felt a little anxious given her recent symptoms.  She reports having alprazolam at home for anxiety for which she has not taken.  She also reports finding meclizine at home and has not used this for her vertigo episodes.  She also reports a blood pressure of 169/88 this morning.  She does not report any chest pain, shortness of breath, fever, ear pain, headache or additional symptoms today.  Chart review completed.  Given symptoms along with assessment findings, advised patient that her examination was  reassuring and likely due to eustachian tube dysfunction.  Rx'd Astelin to the patient's preferred pharmacy.  Her blood pressure in clinic today was not concerning for hypertensive urgency or emergency.  Advised that her blood pressure increase is likely due to anxiety over health.   Advised that she should use her alprazolam on a as needed basis when she feels anxious.  Also advised the patient to use meclizine along with her Astelin nasal spray in preparation for her plane ride.  The patient does not have any bulging of TMs or erythema suggestive of infection.  Advised of concerning symptoms for which she would need to report to the emergency department as outlined in her AVS.  Patient verbalized understanding and agreed with plan.  Stable on discharge.  Return as needed.  ASSESSMENT/PLAN:  1. Acute dysfunction of right eustachian tube  2. Essential hypertension  3. Anxiety about health  Meds ordered this encounter  Medications   azelastine (ASTELIN) 0.1 % nasal spray    Sig: Place 2 sprays into both nostrils 2 (two) times daily for 7 days. Use in each nostril as directed    Dispense:  30 mL    Refill:  0    Order Specific Question:   Supervising Provider    Answer:   Chase Picket [id number]   Instructions about new medications and side effects provided.  Plan:   Discharge Instructions      Use medications as directed Monitor your blood pressure and if you begin to have symptoms associated with elevated blood pressure such as headache, severe dizziness, chest pain, weakness go to the nearest ER       A copy of these instructions have been given to the patient or responsible adult who demonstrated the ability to learn, asked appropriate questions, and verbalized understanding of the plan of care.  There were no barriers to learning identified.   Serafina Royals, FNP-C 08/03/20  This note was partially made with the aid of speech-to-text dictation; typographical errors are not intentional.    Serafina Royals, [place] 08/03/20 1131

## 2020-08-03 NOTE — ED Triage Notes (Signed)
Patient c/o hypertension and RT ear fullness x 3 days.   Patient denies ear pain or fever.   Patient denies Chest Pain or SOB.   Patient endorses " it feels like theres a high pitched noise".   Patient endorses vertigo " a few days ago but not today".   Patient endorses a BP of 169/88 this morning.   Patient endorses a history of HTN and Vertigo.

## 2020-08-03 NOTE — Progress Notes (Signed)
I'm sorry, I can't accurately diagnose or treat this condition without a physical exam.  Based on what you shared with me, I feel your condition warrants further evaluation and I recommend that you be seen in a face to face visit for advice on flying.   NOTE: There will be NO CHARGE for this eVisit   If you are having a true medical emergency please call 911.      For an urgent face to face visit, [facility] has six urgent care centers for your convenience:     [facility] [place] [facility] [place] Get Driving Directions [phone] [place], [place] [address]    [facility]) Get Driving Directions [phone] [place], [place] [address]  [facility] [place] ([place]) Get Driving Directions [phone] [address] [place],  [place]  [address]  [facility] Get Driving Directions [phone] [address] [place], [place], [place] [address]   [facility] Get Driving Directions  [phone] [address] [place], [place] [address]   [facility] [place] Get Driving Directions [phone] [address] [place], [place] [address]  Your MyChart E-visit questionnaire answers were reviewed by a board certified advanced clinical practitioner to complete your personal care plan based on your specific symptoms.  Thank you for using e-Visits.   Approximately 5 minutes was used in reviewing the patient's chart, questionnaire, prescribing medications, and documentation.

## 2020-08-03 NOTE — Discharge Instructions (Signed)
Use medications as directed Monitor your blood pressure and if you begin to have symptoms associated with elevated blood pressure such as headache, severe dizziness, chest pain, weakness go to the nearest ER

## 2020-08-31 ENCOUNTER — Telehealth: Payer: Self-pay

## 2020-08-31 NOTE — Telephone Encounter (Signed)
Pt wants to know if she can take prednisone that was prescribed to her in July. Her left ear is still bothering her. She states that she never took the prednisone because she was going on vacation. Please advise.

## 2020-09-03 NOTE — Telephone Encounter (Signed)
Pt is aware that she can go ahead and take the prednisone taper.

## 2020-09-03 NOTE — Telephone Encounter (Signed)
LMTCB

## 2020-10-20 ENCOUNTER — Other Ambulatory Visit: Payer: Self-pay | Admitting: Internal Medicine

## 2020-10-21 ENCOUNTER — Other Ambulatory Visit: Payer: Self-pay | Admitting: Internal Medicine

## 2020-10-22 ENCOUNTER — Telehealth: Payer: Self-pay | Admitting: Internal Medicine

## 2020-10-22 ENCOUNTER — Other Ambulatory Visit: Payer: Self-pay | Admitting: Internal Medicine

## 2020-10-22 MED ORDER — ZOLPIDEM TARTRATE 5 MG PO TABS: 5.0000 mg | ORAL_TABLET | Freq: Every evening | ORAL | 5 refills | Status: DC | PRN

## 2020-10-22 NOTE — Progress Notes (Signed)
Please notify patient that The generic Lorrin Mais has been refilled, but I cannot refill for 90 day quantities bc it is a controlled substance, so It was sent for 30 days with 5 refills  to [facility] in [place]   Regards,   Deborra Medina, MD

## 2020-10-22 NOTE — Telephone Encounter (Signed)
RX Refill:ambien Last Seen:05-10-20 Last ordered:02-27-20

## 2020-10-22 NOTE — Telephone Encounter (Signed)
Please notify patient that The generic Lorrin Mais has been refilled, but I cannot refill for 90 day quantities bc it is a controlled substance, so It was sent for 30 days with 5 refills  to [facility] [place]    Regards,     Deborra Medina, MD

## 2020-10-29 ENCOUNTER — Other Ambulatory Visit: Payer: Self-pay | Admitting: Internal Medicine

## 2020-10-29 DIAGNOSIS — Z1231 Encounter for screening mammogram for malignant neoplasm of breast: Secondary | ICD-10-CM

## 2020-11-07 ENCOUNTER — Other Ambulatory Visit (INDEPENDENT_AMBULATORY_CARE_PROVIDER_SITE_OTHER): Payer: Medicare HMO

## 2020-11-07 ENCOUNTER — Other Ambulatory Visit: Payer: Self-pay

## 2020-11-07 DIAGNOSIS — I10 Essential (primary) hypertension: Secondary | ICD-10-CM

## 2020-11-07 DIAGNOSIS — R7303 Prediabetes: Secondary | ICD-10-CM | POA: Diagnosis not present

## 2020-11-07 LAB — HEMOGLOBIN A1C: Hgb A1c MFr Bld: 5.7 % (ref 4.6–6.5)

## 2020-11-07 LAB — COMPREHENSIVE METABOLIC PANEL
ALT: 14 U/L (ref 0–35)
AST: 15 U/L (ref 0–37)
Albumin: 4 g/dL (ref 3.5–5.2)
Alkaline Phosphatase: 56 U/L (ref 39–117)
BUN: 16 mg/dL (ref 6–23)
CO2: 28 mEq/L (ref 19–32)
Calcium: 9.5 mg/dL (ref 8.4–10.5)
Chloride: 106 mEq/L (ref 96–112)
Creatinine, Ser: 0.81 mg/dL (ref 0.40–1.20)
GFR: 75.3 mL/min (ref >60.00)
Glucose, Bld: 95 mg/dL (ref 70–99)
Potassium: 4.3 mEq/L (ref 3.5–5.1)
Sodium: 140 mEq/L (ref 135–145)
Total Bilirubin: 0.8 mg/dL (ref 0.2–1.2)
Total Protein: 6.5 g/dL (ref 6.0–8.3)

## 2020-11-09 ENCOUNTER — Ambulatory Visit (INDEPENDENT_AMBULATORY_CARE_PROVIDER_SITE_OTHER): Payer: Medicare HMO | Admitting: Internal Medicine

## 2020-11-09 ENCOUNTER — Other Ambulatory Visit: Payer: Self-pay

## 2020-11-09 ENCOUNTER — Encounter: Payer: Self-pay | Admitting: Internal Medicine

## 2020-11-09 DIAGNOSIS — R7303 Prediabetes: Secondary | ICD-10-CM

## 2020-11-09 DIAGNOSIS — I700 Atherosclerosis of aorta: Secondary | ICD-10-CM | POA: Diagnosis not present

## 2020-11-09 DIAGNOSIS — I10 Essential (primary) hypertension: Secondary | ICD-10-CM

## 2020-11-09 DIAGNOSIS — B0229 Other postherpetic nervous system involvement: Secondary | ICD-10-CM | POA: Diagnosis not present

## 2020-11-09 DIAGNOSIS — E782 Mixed hyperlipidemia: Secondary | ICD-10-CM

## 2020-11-09 DIAGNOSIS — L719 Rosacea, unspecified: Secondary | ICD-10-CM

## 2020-11-09 DIAGNOSIS — F418 Other specified anxiety disorders: Secondary | ICD-10-CM

## 2020-11-09 DIAGNOSIS — R4589 Other symptoms and signs involving emotional state: Secondary | ICD-10-CM

## 2020-11-09 NOTE — Assessment & Plan Note (Signed)
She has lowered her a1c into normal range with increased exercise.  Eating mediterranean style

## 2020-11-09 NOTE — Progress Notes (Signed)
Subjective:  Patient ID: Amber Sullivan, female    DOB: Mar 01, 1955  Age: 65 y.o. MRN: [id number]  CC: Diagnoses of Prediabetes, Mixed hyperlipidemia, Aortic arch atherosclerosis ([place]), Post herpetic neuralgia, Anxiety about health, Rosacea, and Primary hypertension were pertinent to this visit.  HPI Amber Sullivan presents for follow up on prediabetes, post herpetic neuralgia, and other issues.  Chief Complaint  Patient presents with   Follow-up    This visit occurred during the SARS-CoV-2 public health emergency.  Safety protocols were in place, including screening questions prior to the visit, additional usage of staff PPE, and extensive cleaning of exam room while observing appropriate contact time as indicated for disinfecting solutions.    Prediabetes:  The patient feels generally well, is swimming 4 days per week for one hour for exercise and weighing weekly .  Following a carbohydrate modified diet 7 days per week. Denies any significant weight change, and has no symptoms suggestive of diabetes (polyuria, polyphagia, polydipsia).      Post herpetic neuralgia:  her shingles pain still occurs with activities that create friction.  Intermittent flares with fatigue, travel,  dressing changes,  symptoms usually last a day or less and are modified with use of aspercreme and topical lidocaine 4% (OTC)   Aortic atherosclerosis : Reviewed findings of prior imaging study   Patient is tolerating high potency statin therapy    She continues to have bouts of  vertigo due to Eustachian tube dysfunction ; most recent episode this summer occurred on the way to  [place].  Avoids getting face and ears in water  when swimming.   Had a facial skin rash diagnosed by dermatology  as rosacea.  Using doxycycline prn flares and a compounded cream on a regular basis  Anxiety:  improved,  she recognizes that her issues are minor compared to most of her friends, trying to be confident and grateful and  continue with her life.    Outpatient Medications Prior to Visit  Medication Sig Dispense Refill   acetaminophen (TYLENOL) 500 MG tablet Take 500 mg by mouth every 6 (six) hours as needed.     albuterol (VENTOLIN HFA) 108 (90 Base) MCG/ACT inhaler Inhale 2 puffs into the lungs every 6 (six) hours as needed for wheezing or shortness of breath. 18 g 2   AZELAIC ACID EX Apply topically. Azelaic Acid/Metronidizole/Ivermectin cream     Calcium Carbonate-Vit D-Min (CALTRATE 600+D PLUS MINERALS) 600-800 MG-UNIT TABS Take 1 tablet by mouth daily. Taking 1 tablet every other day     celecoxib (CELEBREX) 200 MG capsule 2 times daily for one week,  Then once daily thereafter 180 capsule 1   diclofenac Sodium (VOLTAREN) 1 % GEL Apply 4 g topically 4 (four) times daily. 100 g 2   diphenhydrAMINE (BENADRYL) 25 MG tablet Take 25 mg by mouth daily.     Doxycycline Hyclate 50 MG TABS Take by mouth as needed.     estradiol (ESTRACE VAGINAL) 0.1 MG/GM vaginal cream Place 1 Applicatorful vaginally 3 (three) times a week. 42.5 g 2   losartan (COZAAR) 100 MG tablet Take 1 tablet (100 mg total) by mouth daily. 90 tablet 3   rosuvastatin (CRESTOR) 10 MG tablet TAKE 1 TABLET BY MOUTH  DAILY 90 tablet 3   traZODone (DESYREL) 50 MG tablet TAKE 1/2 TO 1 TABLET BY  MOUTH AT BEDTIME AS NEEDED  FOR SLEEP 90 tablet 3   zolpidem (AMBIEN) 5 MG tablet Take 1 tablet (5 mg total) by  mouth at bedtime as needed for sleep. 30 tablet 5   ALPRAZolam (XANAX) 0.25 MG tablet Take 1 tablet (0.25 mg total) by mouth 2 (two) times daily as needed for anxiety. 20 tablet 0   azelastine (ASTELIN) 0.1 % nasal spray Place 2 sprays into both nostrils 2 (two) times daily for 7 days. Use in each nostril as directed 30 mL 0   predniSONE (DELTASONE) 10 MG tablet 6 tablets on Day 1 , then reduce by 1 tablet daily until gone (Patient not taking: Reported on 11/09/2020) 21 tablet 0   No facility-administered medications prior to visit.    Review of  Systems;  Patient denies headache, fevers, malaise, unintentional weight loss, skin rash, eye pain, sinus congestion and sinus pain, sore throat, dysphagia,  hemoptysis , cough, dyspnea, wheezing, chest pain, palpitations, orthopnea, edema, abdominal pain, nausea, melena, diarrhea, constipation, flank pain, dysuria, hematuria, urinary  Frequency, nocturia, numbness, tingling, seizures,  Focal weakness, Loss of consciousness,  Tremor, insomnia, depression, anxiety, and suicidal ideation.      Objective:  BP 120/72   Pulse 96   Temp (!) 96.8 F (36 C)   Ht 5\' 5"  (1.651 m)   Wt 144 lb (65.3 kg)   SpO2 99%   BMI 23.96 kg/m   BP Readings from Last 3 Encounters:  11/09/20 120/72  08/03/20 (!) 145/84  07/31/20 140/64    Wt Readings from Last 3 Encounters:  11/09/20 144 lb (65.3 kg)  07/31/20 142 lb 3.2 oz (64.5 kg)  06/12/20 145 lb (65.8 kg)    General appearance: alert, cooperative and appears stated age Ears: normal TM's and external ear canals both ears Throat: lips, mucosa, and tongue normal; teeth and gums normal Neck: no adenopathy, no carotid bruit, supple, symmetrical, trachea midline and thyroid not enlarged, symmetric, no tenderness/mass/nodules Back: symmetric, no curvature. ROM normal. No CVA tenderness. Lungs: clear to auscultation bilaterally Heart: regular rate and rhythm, S1, S2 normal, no murmur, click, rub or gallop Abdomen: soft, non-tender; bowel sounds normal; no masses,  no organomegaly Pulses: 2+ and symmetric Skin: Skin color, texture, turgor normal. No rashes or lesions Lymph nodes: Cervical, supraclavicular, and axillary nodes normal. Psych: affect normal, makes good eye contact. No fidgeting,  Smiles easily.  Denies symptoms of active depression   Lab Results  Component Value Date   HGBA1C 5.7 11/07/2020   HGBA1C 6.0 11/02/2019    Lab Results  Component Value Date   CREATININE 0.81 11/07/2020   CREATININE 0.80 04/30/2020   CREATININE 0.74  12/11/2019    Lab Results  Component Value Date   WBC 4.2 04/30/2020   HGB 13.6 04/30/2020   HCT 41.2 04/30/2020   PLT 202.0 04/30/2020   GLUCOSE 95 11/07/2020   CHOL 162 04/30/2020   TRIG 80.0 04/30/2020   HDL 62.20 04/30/2020   LDLDIRECT 109.0 10/25/2014   LDLCALC 83 04/30/2020   ALT 14 11/07/2020   AST 15 11/07/2020   NA 140 11/07/2020   K 4.3 11/07/2020   CL 106 11/07/2020   CREATININE 0.81 11/07/2020   BUN 16 11/07/2020   CO2 28 11/07/2020   TSH 1.479 12/11/2019   HGBA1C 5.7 11/07/2020   MICROALBUR <0.7 04/30/2020    No results found.  Assessment & Plan:   Problem List Items Addressed This Visit     Hyperlipidemia    Statin therapy initiated due to evidence of atherosclerotic disease in the aorta.  She is tolerating Crestor with excellent reduction in LDL and normal LFTs.  No changes today.  Lab Results  Component Value Date   CHOL 162 04/30/2020   HDL 62.20 04/30/2020   LDLCALC 83 04/30/2020   LDLDIRECT 109.0 10/25/2014   TRIG 80.0 04/30/2020   CHOLHDL 3 04/30/2020   Lab Results  Component Value Date   ALT 14 11/07/2020   AST 15 11/07/2020   ALKPHOS 56 11/07/2020   BILITOT 0.8 11/07/2020         Hypertension    Well controlled on current regimen of losartan . Renal function stable, no changes today.  Lab Results  Component Value Date   CREATININE 0.81 11/07/2020   Lab Results  Component Value Date   NA 140 11/07/2020   K 4.3 11/07/2020   CL 106 11/07/2020   CO2 28 11/07/2020         Aortic arch atherosclerosis ([place])    Suggested on chest film from 2020.  She is tolerating high potency statin .       Prediabetes    She has lowered her a1c into normal range with increased exercise.  Eating mediterranean style       Post herpetic neuralgia    Her pain is recurrent and Involving left thigh .  pain has improved with increased focus on exercise and use of b12 supplementation       Anxiety about health    Encouraged  to focus on her  generally good health .      Rosacea    Managed with a ompounded cream form Dr Kellie Moor and prn use of doxycycline       I spent 30 mintutes dedicated to the care of this patient on the date of this encounter to include pre-visit review of her medical history,  Face-to-face time with the patient , and post visit ordering of testing and therapeutics.   Medications Discontinued During This Encounter  Medication Reason   ALPRAZolam (XANAX) 0.25 MG tablet     Follow-up: Return in about 6 months (around 05/10/2021).   Crecencio Mc, MD

## 2020-11-11 DIAGNOSIS — L719 Rosacea, unspecified: Secondary | ICD-10-CM | POA: Insufficient documentation

## 2020-11-11 NOTE — Assessment & Plan Note (Signed)
Managed with a ompounded cream form Dr Kellie Moor and prn use of doxycycline

## 2020-11-11 NOTE — Assessment & Plan Note (Signed)
Statin therapy initiated due to evidence of atherosclerotic disease in the aorta.  She is tolerating Crestor with excellent reduction in LDL and normal LFTs.  No changes today.  Lab Results  Component Value Date   CHOL 162 04/30/2020   HDL 62.20 04/30/2020   LDLCALC 83 04/30/2020   LDLDIRECT 109.0 10/25/2014   TRIG 80.0 04/30/2020   CHOLHDL 3 04/30/2020   Lab Results  Component Value Date   ALT 14 11/07/2020   AST 15 11/07/2020   ALKPHOS 56 11/07/2020   BILITOT 0.8 11/07/2020

## 2020-11-11 NOTE — Assessment & Plan Note (Signed)
Well controlled on current regimen of losartan . Renal function stable, no changes today.  Lab Results  Component Value Date   CREATININE 0.81 11/07/2020   Lab Results  Component Value Date   NA 140 11/07/2020   K 4.3 11/07/2020   CL 106 11/07/2020   CO2 28 11/07/2020

## 2020-11-11 NOTE — Assessment & Plan Note (Signed)
Encouraged  to focus on her generally good health .

## 2020-11-11 NOTE — Assessment & Plan Note (Signed)
Her pain is recurrent and Involving left thigh .  pain has improved with increased focus on exercise and use of b12 supplementation

## 2020-11-11 NOTE — Assessment & Plan Note (Signed)
Suggested on chest film from 2020.  She is tolerating high potency statin .  

## 2020-11-20 ENCOUNTER — Ambulatory Visit
Admission: RE | Admit: 2020-11-20 | Discharge: 2020-11-20 | Disposition: A | Discharge status: Home / Self Care | Payer: Medicare HMO | Source: Ambulatory Visit | Attending: Internal Medicine | Admitting: Internal Medicine

## 2020-11-20 ENCOUNTER — Other Ambulatory Visit: Payer: Self-pay

## 2020-11-20 DIAGNOSIS — Z1231 Encounter for screening mammogram for malignant neoplasm of breast: Secondary | ICD-10-CM | POA: Insufficient documentation

## 2020-11-27 ENCOUNTER — Telehealth: Payer: Self-pay

## 2020-11-27 NOTE — Telephone Encounter (Signed)
Spoke with pt and scheduled her for a video visit on Friday. Pt gave a verbal understanding.

## 2020-11-27 NOTE — Telephone Encounter (Signed)
Sure, I can make that work.  Thank you. Denna Haggard, Janett Billow, CMA  [place], Iz[place] Gala [place] 3 hours ago (8:31 AM)   Would you be willing to schedule a virtual/video visit with Dr. Derrel Nip tomorrow at [facility]?    Janett Billow, [place]

## 2020-11-30 ENCOUNTER — Encounter: Payer: Self-pay | Admitting: Internal Medicine

## 2020-11-30 ENCOUNTER — Telehealth (INDEPENDENT_AMBULATORY_CARE_PROVIDER_SITE_OTHER): Payer: Medicare HMO | Admitting: Internal Medicine

## 2020-11-30 DIAGNOSIS — J0110 Acute frontal sinusitis, unspecified: Secondary | ICD-10-CM | POA: Diagnosis not present

## 2020-11-30 MED ORDER — AMOXICILLIN-POT CLAVULANATE 875-125 MG PO TABS: 1.0000 | ORAL_TABLET | Freq: Two times a day (BID) | ORAL | 0 refills | Status: DC

## 2020-11-30 MED ORDER — PREDNISONE 10 MG PO TABS: ORAL_TABLET | ORAL | 0 refills | Status: DC

## 2020-11-30 NOTE — Assessment & Plan Note (Signed)
COVID NEGATIVE X 4 PER HOME CHECKS. Given chronicity of symptoms, development of facial pain and history consistent with bacterial URI,  Will treat with  rednisone taper and saline lavage ,  Advised to start antibiotics if no improvement in symptoms in 48 hours . Use of daily  Probiotic advised

## 2020-11-30 NOTE — Progress Notes (Signed)
Virtual Visit via [place] Note  This visit type was conducted due to national recommendations for restrictions regarding the COVID-19 pandemic (e.g. social distancing).  This format is felt to be most appropriate for this patient at this time.  All issues noted in this document were discussed and addressed.  No physical exam was performed (except for noted visual exam findings with Video Visits).   I connected withNAME@ on 11/30/20 at  9:00 AM EST by a video enabled telemedicine application  and verified that I am speaking with the correct person using two identifiers. Location patient: home Location provider: work or home office Persons participating in the virtual visit: patient, provider  I discussed the limitations, risks, security and privacy concerns of performing an evaluation and management service by telephone and the availability of in person appointments. I also discussed with the patient that there may be a patient responsible charge related to this service. The patient expressed understanding and agreed to proceed.  Reason for visit:   HPI:  URI symptoms DEVELOPED OVER 2 WEEKS AGO SINCE going on a trip.  No fevers. No change in Dry cough . COVID NEGATIVE X 4 .  However sinuses have remained congested . Rhinitis is clear. Have  Right ear intermittent fullness without pain . Sinuses are tender to palpation over the frontal regions . NO wheezing or pleurisy. No body aches     ROS: See pertinent positives and negatives per HPI.  Past Medical History:  Diagnosis Date   Fracture of lateral malleolus 2009   Fracture of lateral malleolus 01/21/2007   Hyperlipidemia    Hypertension 2003   Positive colorectal cancer screening using Cologuard test 04/20/2016   Positive colorectal cancer screening using DNA-based stool test 04/28/2016   Positive cologuard.  June 2018.  Hyperplastic polyps, July 2018 colonosocpy   10 yr follow up ([facility])    Tendonitis of elbow, right 07/06/2019     Past Surgical History:  Procedure Laterality Date   CHOLECYSTECTOMY  2005   COLONOSCOPY WITH PROPOFOL N/A 08/13/2016   Procedure: COLONOSCOPY WITH PROPOFOL;  Surgeon: Robert Bellow, MD;  Location: [facility] ENDOSCOPY;  Service: Endoscopy;  Laterality: N/A;   MOHS SURGERY  2007   squamou cell carcinoma rt nasolabial fold   SQUAMOUS CELL CARCINOMA EXCISION  2007   mole on face    Family History  Problem Relation Age of Onset   Hypertension Mother    Diabetes Mother        type 2   Stroke Mother 47       massive, with aphasia and paraplegia   Hypertension Father    Hyperlipidemia Father    Heart attack Father        vs PE during hospitalization for chest pain    Heart disease Father    Heart attack Maternal Grandfather    Heart disease Paternal Grandmother        CHF   Heart disease Paternal Grandfather    Heart attack Paternal Grandfather    Heart attack Brother 69   Breast cancer Neg Hx     SOCIAL HX:  reports that she has never smoked. She has never used smokeless tobacco. She reports that she does not currently use alcohol. She reports that she does not use drugs.    Current Outpatient Medications:    acetaminophen (TYLENOL) 500 MG tablet, Take 500 mg by mouth every 6 (six) hours as needed., Disp: , Rfl:    AZELAIC ACID EX, Apply topically. Azelaic Acid/Metronidizole/Ivermectin  cream, Disp: , Rfl:    Calcium Carbonate-Vit D-Min (CALTRATE 600+D PLUS MINERALS) 600-800 MG-UNIT TABS, Take 1 tablet by mouth daily. Taking 1 tablet every other day, Disp: , Rfl:    diclofenac Sodium (VOLTAREN) 1 % GEL, Apply 4 g topically 4 (four) times daily., Disp: 100 g, Rfl: 2   Doxycycline Hyclate 50 MG TABS, Take by mouth as needed., Disp: , Rfl:    estradiol (ESTRACE VAGINAL) 0.1 MG/GM vaginal cream, Place 1 Applicatorful vaginally 3 (three) times a week., Disp: 42.5 g, Rfl: 2   losartan (COZAAR) 100 MG tablet, Take 1 tablet (100 mg total) by mouth daily., Disp: 90 tablet, Rfl: 3    melatonin 5 MG TABS, Take 5 mg by mouth., Disp: , Rfl:    rosuvastatin (CRESTOR) 10 MG tablet, TAKE 1 TABLET BY MOUTH  DAILY, Disp: 90 tablet, Rfl: 3   traZODone (DESYREL) 50 MG tablet, TAKE 1/2 TO 1 TABLET BY  MOUTH AT BEDTIME AS NEEDED  FOR SLEEP, Disp: 90 tablet, Rfl: 3   zolpidem (AMBIEN) 5 MG tablet, Take 1 tablet (5 mg total) by mouth at bedtime as needed for sleep., Disp: 30 tablet, Rfl: 5   albuterol (VENTOLIN HFA) 108 (90 Base) MCG/ACT inhaler, Inhale 2 puffs into the lungs every 6 (six) hours as needed for wheezing or shortness of breath. (Patient not taking: Reported on 11/30/2020), Disp: 18 g, Rfl: 2   azelastine (ASTELIN) 0.1 % nasal spray, Place 2 sprays into both nostrils 2 (two) times daily for 7 days. Use in each nostril as directed (Patient not taking: Reported on 11/30/2020), Disp: 30 mL, Rfl: 0   celecoxib (CELEBREX) 200 MG capsule, 2 times daily for one week,  Then once daily thereafter (Patient not taking: Reported on 11/30/2020), Disp: 180 capsule, Rfl: 1   diphenhydrAMINE (BENADRYL) 25 MG tablet, Take 25 mg by mouth daily. (Patient not taking: Reported on 11/30/2020), Disp: , Rfl:   EXAM:  VITALS per patient if applicable:  GENERAL: alert, oriented, appears well and in no acute distress  HEENT: atraumatic, conjunttiva clear, no obvious abnormalities on inspection of external nose and ears  NECK: normal movements of the head and neck  LUNGS: on inspection no signs of respiratory distress, breathing rate appears normal, no obvious gross SOB, gasping or wheezing  CV: no obvious cyanosis  MS: moves all visible extremities without noticeable abnormality  PSYCH/NEURO: pleasant and cooperative, no obvious depression or anxiety, speech and thought processing grossly intact  ASSESSMENT AND PLAN:  Discussed the following assessment and plan:  No diagnosis found.  No problem-specific Assessment & Plan notes found for this encounter.    I discussed the assessment and  treatment plan with the patient. The patient was provided an opportunity to ask questions and all were answered. The patient agreed with the plan and demonstrated an understanding of the instructions.   The patient was advised to call back or seek an in-person evaluation if the symptoms worsen or if the condition fails to improve as anticipated.   I spent 30 minutes dedicated to the care of this patient on the date of this encounter to include pre-visit review of his medical history,  Face-to-face time with the patient , and post visit ordering of testing and therapeutics.    Crecencio Mc, MD

## 2021-01-25 ENCOUNTER — Encounter: Payer: Self-pay | Admitting: Internal Medicine

## 2021-01-28 MED ORDER — BENZONATATE 200 MG PO CAPS: 200.0000 mg | ORAL_CAPSULE | Freq: Three times a day (TID) | ORAL | 1 refills | Status: DC | PRN

## 2021-01-28 NOTE — Addendum Note (Signed)
Addended by: Crecencio Mc on: 01/28/2021 12:24 PM   Modules accepted: Orders

## 2021-02-21 ENCOUNTER — Ambulatory Visit (INDEPENDENT_AMBULATORY_CARE_PROVIDER_SITE_OTHER): Payer: Medicare HMO | Admitting: Internal Medicine

## 2021-02-21 ENCOUNTER — Other Ambulatory Visit: Payer: Self-pay

## 2021-02-21 ENCOUNTER — Encounter: Payer: Self-pay | Admitting: Internal Medicine

## 2021-02-21 VITALS — BP 122/68 | HR 83 | Temp 97.6°F | Ht 65.0 in | Wt 143.6 lb

## 2021-02-21 DIAGNOSIS — Z0000 Encounter for general adult medical examination without abnormal findings: Secondary | ICD-10-CM

## 2021-02-21 DIAGNOSIS — M810 Age-related osteoporosis without current pathological fracture: Secondary | ICD-10-CM

## 2021-02-21 DIAGNOSIS — H938X1 Other specified disorders of right ear: Secondary | ICD-10-CM | POA: Diagnosis not present

## 2021-02-21 DIAGNOSIS — R7303 Prediabetes: Secondary | ICD-10-CM

## 2021-02-21 DIAGNOSIS — E782 Mixed hyperlipidemia: Secondary | ICD-10-CM | POA: Diagnosis not present

## 2021-02-21 NOTE — Patient Instructions (Addendum)
Knee popping is not serious if it is not accompanied by bruising,  swelling or pain  It;s a reminder that deep lunges, deep knee bends ,  and side to side motions are hard on ligaments  I'll order your DEXA  ENT referral made

## 2021-02-21 NOTE — Progress Notes (Signed)
Patient ID: Amber Sullivan, female    DOB: [birth year]  Age: 66 y.o. MRN: [id number]  The patient is here for annual  preventive examination and management of other chronic and acute problems.  This visit occurred during the SARS-CoV-2 public health emergency.  Safety protocols were in place, including screening questions prior to the visit, additional usage of staff PPE, and extensive cleaning of exam room while observing appropriate contact time as indicated for disinfecting solutions.     The risk factors are reflected in the social history.  The roster of all physicians providing medical care to patient - is listed in the Snapshot section of the chart.  Activities of daily living:  The patient is 100% independent in all ADLs: dressing, toileting, feeding as well as independent mobility  Home safety : The patient has smoke detectors in the home. They wear seatbelts.  There are no firearms at home. There is no violence in the home.   There is no risks for hepatitis, STDs or HIV. There is no   history of blood transfusion. They have no travel history to infectious disease endemic areas of the world.  The patient has seen their dentist in the last six month. They have seen their eye doctor in the last year. They admit to slight hearing difficulty with regard to whispered voices and some television programs.  They have deferred audiologic testing in the last year.  They do not  have excessive sun exposure. Discussed the need for sun protection: hats, long sleeves and use of sunscreen if there is significant sun exposure.   Diet: the importance of a healthy diet is discussed. They do have a healthy diet.  The benefits of regular aerobic exercise were discussed. She swims or participates in yoga 6  days per week for 60 minutes.   Depression screen: there are no signs or vegative symptoms of depression- irritability, change in appetite, anhedonia, sadness/tearfullness.  Cognitive assessment:  the patient manages all their financial and personal affairs and is actively engaged. They could relate day,date,year and events; recalled 2/3 objects at 3 minutes; performed clock-face test normally.  The following portions of the patient's history were reviewed and updated as appropriate: allergies, current medications, past family history, past medical history,  past surgical history, past social history  and problem list.  Visual acuity was not assessed per patient preference since she has regular follow up with her ophthalmologist. Hearing and body mass index were assessed and reviewed.   During the course of the visit the patient was educated and counseled about appropriate screening and preventive services including : fall prevention , diabetes screening, nutrition counseling, colorectal cancer screening, and recommended immunizations.    CC: The primary encounter diagnosis was Sensation of fullness in right ear. Diagnoses of Mixed hyperlipidemia, Prediabetes, Encounter for preventive health examination, and Age-related osteoporosis without current pathological fracture were also pertinent to this visit.  1) left knee popping without pain for the last 4 weeks.  Denies pain, swelling,  more audible in the morning.   History Amber Sullivan has a past medical history of Fracture of lateral malleolus (2009), Fracture of lateral malleolus (01/21/2007), Hyperlipidemia, Hypertension (2003), Positive colorectal cancer screening using Cologuard test (04/20/2016), Positive colorectal cancer screening using DNA-based stool test (04/28/2016), and Tendonitis of elbow, right (07/06/2019).   She has a past surgical history that includes Cholecystectomy (2005); Squamous cell carcinoma excision (2007); Mohs surgery (2007); and Colonoscopy with propofol (N/A, 08/13/2016).   Her family history includes Diabetes in  her mother; Heart attack in her father, maternal grandfather, and paternal grandfather; Heart attack (age of  onset: 55) in her brother; Heart disease in her father, paternal grandfather, and paternal grandmother; Hyperlipidemia in her father; Hypertension in her father and mother; Stroke (age of onset: 15) in her mother.She reports that she has never smoked. She has never used smokeless tobacco. She reports that she does not currently use alcohol. She reports that she does not use drugs.  Outpatient Medications Prior to Visit  Medication Sig Dispense Refill   acetaminophen (TYLENOL) 500 MG tablet Take 500 mg by mouth every 6 (six) hours as needed.     albuterol (VENTOLIN HFA) 108 (90 Base) MCG/ACT inhaler Inhale 2 puffs into the lungs every 6 (six) hours as needed for wheezing or shortness of breath. 18 g 2   AZELAIC ACID EX Apply topically. Azelaic Acid/Metronidizole/Ivermectin cream     azelastine (ASTELIN) 0.1 % nasal spray Place 2 sprays into both nostrils 2 (two) times daily for 7 days. Use in each nostril as directed 30 mL 0   benzonatate (TESSALON) 200 MG capsule Take 1 capsule (200 mg total) by mouth 3 (three) times daily as needed for cough. 90 capsule 1   Calcium Carbonate-Vit D-Min (CALTRATE 600+D PLUS MINERALS) 600-800 MG-UNIT TABS Take 1 tablet by mouth daily. Taking 1 tablet every other day     diclofenac Sodium (VOLTAREN) 1 % GEL Apply 4 g topically 4 (four) times daily. 100 g 2   diphenhydrAMINE (BENADRYL) 25 MG tablet Take 25 mg by mouth daily.     Doxycycline Hyclate 50 MG TABS Take by mouth as needed.     estradiol (ESTRACE VAGINAL) 0.1 MG/GM vaginal cream Place 1 Applicatorful vaginally 3 (three) times a week. 42.5 g 2   losartan (COZAAR) 100 MG tablet Take 1 tablet (100 mg total) by mouth daily. 90 tablet 3   melatonin 5 MG TABS Take 5 mg by mouth.     rosuvastatin (CRESTOR) 10 MG tablet TAKE 1 TABLET BY MOUTH  DAILY 90 tablet 3   traZODone (DESYREL) 50 MG tablet TAKE 1/2 TO 1 TABLET BY  MOUTH AT BEDTIME AS NEEDED  FOR SLEEP 90 tablet 3   zolpidem (AMBIEN) 5 MG tablet Take 1 tablet (5  mg total) by mouth at bedtime as needed for sleep. 30 tablet 5   amoxicillin-clavulanate (AUGMENTIN) 875-125 MG tablet Take 1 tablet by mouth 2 (two) times daily. (Patient not taking: Reported on 02/21/2021) 14 tablet 0   celecoxib (CELEBREX) 200 MG capsule 2 times daily for one week,  Then once daily thereafter (Patient not taking: Reported on 11/30/2020) 180 capsule 1   predniSONE (DELTASONE) 10 MG tablet 6 tablets on Day 1 , then reduce by 1 tablet daily until gone (Patient not taking: Reported on 02/21/2021) 21 tablet 0   No facility-administered medications prior to visit.    Review of Systems  Patient denies headache, fevers, malaise, unintentional weight loss, skin rash, eye pain, sinus congestion and sinus pain, sore throat, dysphagia,  hemoptysis , cough, dyspnea, wheezing, chest pain, palpitations, orthopnea, edema, abdominal pain, nausea, melena, diarrhea, constipation, flank pain, dysuria, hematuria, urinary  Frequency, nocturia, numbness, tingling, seizures,  Focal weakness, Loss of consciousness,  Tremor, insomnia, depression, anxiety, and suicidal ideation.     Objective:  BP 122/68 (BP Location: Left Arm, Patient Position: Sitting, Cuff Size: Normal)    Pulse 83    Temp 97.6 F (36.4 C) (Oral)    Ht 5\' 5"  (  1.651 m)    Wt 143 lb 9.6 oz (65.1 kg)    SpO2 98%    BMI 23.90 kg/m   Physical Exam  General appearance: alert, cooperative and appears stated age Ears: normal TM's and external ear canals both ears Throat: lips, mucosa, and tongue normal; teeth and gums normal Neck: no adenopathy, no carotid bruit, supple, symmetrical, trachea midline and thyroid not enlarged, symmetric, no tenderness/mass/nodules Back: symmetric, no curvature. ROM normal. No CVA tenderness. Lungs: clear to auscultation bilaterally Heart: regular rate and rhythm, S1, S2 normal, no murmur, click, rub or gallop Abdomen: soft, non-tender; bowel sounds normal; no masses,  no organomegaly Pulses: 2+ and  symmetric Skin: Skin color, texture, turgor normal. No rashes or lesions Lymph nodes: Cervical, supraclavicular, and axillary nodes normal.   Assessment & Plan:   Problem List Items Addressed This Visit     Hyperlipidemia   Relevant Orders   Comprehensive metabolic panel   Lipid panel   Encounter for preventive health examination    age appropriate education and counseling updated, referrals for preventative services and immunizations addressed, dietary and smoking counseling addressed, most recent labs reviewed.  I have personally reviewed and have noted:   1) the patient's medical and social history 2) The pt's use of alcohol, tobacco, and illicit drugs 3) The patient's current medications and supplements 4) Functional ability including ADL's, fall risk, home safety risk, hearing and visual impairment 5) Diet and physical activities 6) Evidence for depression or mood disorder 7) The patient's height, weight, and BMI have been recorded in the chart   I have made referrals, and provided counseling and education based on review of the above      Osteoporosis   Relevant Orders   DG Bone Density   Prediabetes   Relevant Orders   Hemoglobin A1c   Other Visit Diagnoses     Sensation of fullness in right ear    -  Primary   Relevant Orders   Ambulatory referral to ENT       I have discontinued Amber Sullivan's celecoxib, predniSONE, and amoxicillin-clavulanate. I am also having her maintain her Caltrate 600+D Plus Minerals, diphenhydrAMINE, estradiol, albuterol, diclofenac Sodium, traZODone, losartan, acetaminophen, azelastine, rosuvastatin, zolpidem, AZELAIC ACID EX, Doxycycline Hyclate, melatonin, and benzonatate.  No orders of the defined types were placed in this encounter.   Medications Discontinued During This Encounter  Medication Reason   amoxicillin-clavulanate (AUGMENTIN) 875-125 MG tablet    celecoxib (CELEBREX) 200 MG capsule    predniSONE (DELTASONE) 10 MG  tablet     Follow-up: Return in about 6 months (around 08/21/2021).   Crecencio Mc, MD

## 2021-02-21 NOTE — Assessment & Plan Note (Signed)

## 2021-03-05 ENCOUNTER — Telehealth: Payer: Self-pay | Admitting: Internal Medicine

## 2021-03-05 ENCOUNTER — Other Ambulatory Visit: Payer: Self-pay

## 2021-03-05 ENCOUNTER — Ambulatory Visit
Admission: EM | Admit: 2021-03-05 | Discharge: 2021-03-05 | Disposition: A | Discharge status: Home / Self Care | Payer: Medicare HMO | Attending: Urgent Care | Admitting: Urgent Care

## 2021-03-05 ENCOUNTER — Encounter: Payer: Self-pay | Admitting: Emergency Medicine

## 2021-03-05 DIAGNOSIS — H6981 Other specified disorders of Eustachian tube, right ear: Secondary | ICD-10-CM | POA: Diagnosis not present

## 2021-03-05 DIAGNOSIS — H9201 Otalgia, right ear: Secondary | ICD-10-CM | POA: Diagnosis not present

## 2021-03-05 DIAGNOSIS — H6121 Impacted cerumen, right ear: Secondary | ICD-10-CM

## 2021-03-05 MED ORDER — LEVOCETIRIZINE DIHYDROCHLORIDE 5 MG PO TABS: 5.0000 mg | ORAL_TABLET | Freq: Every evening | ORAL | 0 refills | Status: DC

## 2021-03-05 MED ORDER — MONTELUKAST SODIUM 10 MG PO TABS: 10.0000 mg | ORAL_TABLET | Freq: Every day | ORAL | 0 refills | Status: DC

## 2021-03-05 MED ORDER — PSEUDOEPHEDRINE HCL 30 MG PO TABS: 30.0000 mg | ORAL_TABLET | Freq: Three times a day (TID) | ORAL | 0 refills | Status: DC | PRN

## 2021-03-05 MED ORDER — FLUTICASONE PROPIONATE 50 MCG/ACT NA SUSP: 2.0000 | Freq: Every day | NASAL | 12 refills | Status: DC

## 2021-03-05 NOTE — ED Triage Notes (Signed)
Pt c/o right ear pain started this morning.

## 2021-03-05 NOTE — ED Provider Notes (Signed)
Amber Sullivan   MRN: [id number] DOB: 1955-09-23  Subjective:   Amber Sullivan is a 66 y.o. female presenting for acute onset of recurrent right ear pain since this morning.  Patient has also had intermittent ringing.  Has had frequent ear problems of the right side for the past year.  Has an appointment with [facility] coming up in March.  Of note, patient did undergo a course of Augmentin and a steroid course 11/30/2020.  No current facility-administered medications for this encounter.  Current Outpatient Medications:    acetaminophen (TYLENOL) 500 MG tablet, Take 500 mg by mouth every 6 (six) hours as needed., Disp: , Rfl:    albuterol (VENTOLIN HFA) 108 (90 Base) MCG/ACT inhaler, Inhale 2 puffs into the lungs every 6 (six) hours as needed for wheezing or shortness of breath., Disp: 18 g, Rfl: 2   AZELAIC ACID EX, Apply topically. Azelaic Acid/Metronidizole/Ivermectin cream, Disp: , Rfl:    azelastine (ASTELIN) 0.1 % nasal spray, Place 2 sprays into both nostrils 2 (two) times daily for 7 days. Use in each nostril as directed, Disp: 30 mL, Rfl: 0   benzonatate (TESSALON) 200 MG capsule, Take 1 capsule (200 mg total) by mouth 3 (three) times daily as needed for cough., Disp: 90 capsule, Rfl: 1   Calcium Carbonate-Vit D-Min (CALTRATE 600+D PLUS MINERALS) 600-800 MG-UNIT TABS, Take 1 tablet by mouth daily. Taking 1 tablet every other day, Disp: , Rfl:    diclofenac Sodium (VOLTAREN) 1 % GEL, Apply 4 g topically 4 (four) times daily., Disp: 100 g, Rfl: 2   diphenhydrAMINE (BENADRYL) 25 MG tablet, Take 25 mg by mouth daily., Disp: , Rfl:    Doxycycline Hyclate 50 MG TABS, Take by mouth as needed., Disp: , Rfl:    estradiol (ESTRACE VAGINAL) 0.1 MG/GM vaginal cream, Place 1 Applicatorful vaginally 3 (three) times a week., Disp: 42.5 g, Rfl: 2   losartan (COZAAR) 100 MG tablet, Take 1 tablet (100 mg total) by mouth daily., Disp: 90 tablet, Rfl: 3   melatonin 5 MG TABS, Take 5 mg  by mouth., Disp: , Rfl:    rosuvastatin (CRESTOR) 10 MG tablet, TAKE 1 TABLET BY MOUTH  DAILY, Disp: 90 tablet, Rfl: 3   traZODone (DESYREL) 50 MG tablet, TAKE 1/2 TO 1 TABLET BY  MOUTH AT BEDTIME AS NEEDED  FOR SLEEP, Disp: 90 tablet, Rfl: 3   zolpidem (AMBIEN) 5 MG tablet, Take 1 tablet (5 mg total) by mouth at bedtime as needed for sleep., Disp: 30 tablet, Rfl: 5   Allergies  Allergen Reactions   Ace Inhibitors Cough    Past Medical History:  Diagnosis Date   Fracture of lateral malleolus 2009   Fracture of lateral malleolus 01/21/2007   Hyperlipidemia    Hypertension 2003   Positive colorectal cancer screening using Cologuard test 04/20/2016   Positive colorectal cancer screening using DNA-based stool test 04/28/2016   Positive cologuard.  June 2018.  Hyperplastic polyps, July 2018 colonosocpy   10 yr follow up ([facility])    Tendonitis of elbow, right 07/06/2019     Past Surgical History:  Procedure Laterality Date   CHOLECYSTECTOMY  2005   COLONOSCOPY WITH PROPOFOL N/A 08/13/2016   Procedure: COLONOSCOPY WITH PROPOFOL;  Surgeon: Robert Bellow, MD;  Location: [facility] END[facility]COPY;  Service: Endoscopy;  Laterality: N/A;   MOHS SURGERY  2007   squamou cell carcinoma rt nasolabial fold   SQUAMOUS CELL CARCINOMA EXCISION  2007   mole on face  Family History  Problem Relation Age of Onset   Hypertension Mother    Diabetes Mother        type 2   Stroke Mother 29       massive, with aphasia and paraplegia   Hypertension Father    Hyperlipidemia Father    Heart attack Father        vs PE during hospitalization for chest pain    Heart disease Father    Heart attack Maternal Grandfather    Heart disease Paternal Grandmother        CHF   Heart disease Paternal Grandfather    Heart attack Paternal Grandfather    Heart attack Brother 68   Breast cancer Neg Hx     Social History   Tobacco Use   Smoking status: Never   Smokeless tobacco: Never  Vaping Use   Vaping Use:  Never used  Substance Use Topics   Alcohol use: Not Currently    Comment: not much   Drug use: No    ROS   Objective:   Vitals: BP 133/69 (BP Location: Left Arm)    Pulse 80    Temp 98.8 F (37.1 C) (Oral)    Resp 18    SpO2 97%   Physical Exam Constitutional:      General: She is not in acute distress.    Appearance: Normal appearance. She is well-developed and normal weight. She is not ill-appearing, toxic-appearing or diaphoretic.  HENT:     Head: Normocephalic and atraumatic.     Right Ear: Tympanic membrane and ear canal normal. No drainage or tenderness. No middle ear effusion. There is impacted cerumen. Tympanic membrane is not erythematous.     Left Ear: Tympanic membrane, ear canal and external ear normal. No drainage or tenderness.  No middle ear effusion. There is no impacted cerumen. Tympanic membrane is not erythematous.     Nose: Nose normal. No congestion or rhinorrhea.     Mouth/Throat:     Mouth: Mucous membranes are moist. No oral lesions.     Pharynx: No pharyngeal swelling, oropharyngeal exudate, posterior oropharyngeal erythema or uvula swelling.     Tonsils: No tonsillar exudate or tonsillar abscesses.  Eyes:     General: No scleral icterus.       Right eye: No discharge.        Left eye: No discharge.     Extraocular Movements: Extraocular movements intact.     Right eye: Normal extraocular motion.     Left eye: Normal extraocular motion.     Conjunctiva/sclera: Conjunctivae normal.  Cardiovascular:     Rate and Rhythm: Normal rate.  Pulmonary:     Effort: Pulmonary effort is normal.  Musculoskeletal:     Cervical back: Normal range of motion and neck supple.  Lymphadenopathy:     Cervical: No cervical adenopathy.  Skin:    General: Skin is warm and dry.  Neurological:     General: No focal deficit present.     Mental Status: She is alert and oriented to person, place, and time.  Psychiatric:        Mood and Affect: Mood normal.         Behavior: Behavior normal.        Thought Content: Thought content normal.        Judgment: Judgment normal.   Ear lavage performed using mixture of peroxide and water.  Pressure irrigation performed using a bottle and a thin ear tube.  Right ear lavage.  Curette was used for manual removal.  Assessment and Plan :   PDMP not reviewed this encounter.  1. Acute otalgia, right   2. Impacted cerumen of right ear   3. Eustachian tube dysfunction, right    Successful right ear lavage.  General management of cerumen impaction reviewed with patient.  Anticipatory guidance provided.  Unremarkable ENT exam.  Will use conservative management for what I suspect is eustachian tube dysfunction.  Recommended starting Flonase, Xyzal, Singulair, Sudafed. Counseled patient on potential for adverse effects with medications prescribed/recommended today, ER and return-to-clinic precautions discussed, patient verbalized understanding.    Jaynee Eagles, PA-C 03/05/21 1049

## 2021-03-06 ENCOUNTER — Encounter: Payer: Self-pay | Admitting: Internal Medicine

## 2021-04-01 ENCOUNTER — Encounter: Payer: Self-pay | Admitting: Internal Medicine

## 2021-04-01 DIAGNOSIS — R059 Cough, unspecified: Secondary | ICD-10-CM

## 2021-04-02 MED ORDER — ALBUTEROL SULFATE HFA 108 (90 BASE) MCG/ACT IN AERS: 2.0000 | INHALATION_SPRAY | Freq: Four times a day (QID) | RESPIRATORY_TRACT | 2 refills | Status: DC | PRN

## 2021-04-10 ENCOUNTER — Other Ambulatory Visit: Payer: Self-pay | Admitting: Internal Medicine

## 2021-04-11 ENCOUNTER — Other Ambulatory Visit: Payer: Self-pay

## 2021-04-11 MED ORDER — LEVOCETIRIZINE DIHYDROCHLORIDE 5 MG PO TABS: 5.0000 mg | ORAL_TABLET | Freq: Every evening | ORAL | 0 refills | Status: DC

## 2021-04-11 MED ORDER — FLUTICASONE PROPIONATE 50 MCG/ACT NA SUSP: 2.0000 | Freq: Every day | NASAL | 1 refills | Status: AC

## 2021-05-07 ENCOUNTER — Other Ambulatory Visit (INDEPENDENT_AMBULATORY_CARE_PROVIDER_SITE_OTHER): Payer: Medicare HMO

## 2021-05-07 DIAGNOSIS — E782 Mixed hyperlipidemia: Secondary | ICD-10-CM | POA: Diagnosis not present

## 2021-05-07 DIAGNOSIS — R7303 Prediabetes: Secondary | ICD-10-CM | POA: Diagnosis not present

## 2021-05-07 LAB — COMPREHENSIVE METABOLIC PANEL
ALT: 19 U/L (ref 0–35)
AST: 19 U/L (ref 0–37)
Albumin: 4 g/dL (ref 3.5–5.2)
Alkaline Phosphatase: 71 U/L (ref 39–117)
BUN: 13 mg/dL (ref 6–23)
CO2: 28 mEq/L (ref 19–32)
Calcium: 9.3 mg/dL (ref 8.4–10.5)
Chloride: 104 mEq/L (ref 96–112)
Creatinine, Ser: 0.79 mg/dL (ref 0.40–1.20)
GFR: 77.32 mL/min (ref >60.00)
Glucose, Bld: 86 mg/dL (ref 70–99)
Potassium: 4.3 mEq/L (ref 3.5–5.1)
Sodium: 139 mEq/L (ref 135–145)
Total Bilirubin: 0.8 mg/dL (ref 0.2–1.2)
Total Protein: 6.8 g/dL (ref 6.0–8.3)

## 2021-05-07 LAB — LIPID PANEL
Cholesterol: 175 mg/dL (ref 0–200)
HDL: 66.2 mg/dL (ref >39.00)
LDL Cholesterol: 93 mg/dL (ref 0–99)
NonHDL: 108.39
Total CHOL/HDL Ratio: 3
Triglycerides: 77 mg/dL (ref 0.0–149.0)
VLDL: 15.4 mg/dL (ref 0.0–40.0)

## 2021-05-07 LAB — HEMOGLOBIN A1C: Hgb A1c MFr Bld: 5.9 % (ref 4.6–6.5)

## 2021-05-10 ENCOUNTER — Ambulatory Visit (INDEPENDENT_AMBULATORY_CARE_PROVIDER_SITE_OTHER): Payer: Medicare HMO | Admitting: Internal Medicine

## 2021-05-10 ENCOUNTER — Encounter: Payer: Self-pay | Admitting: Internal Medicine

## 2021-05-10 ENCOUNTER — Ambulatory Visit (INDEPENDENT_AMBULATORY_CARE_PROVIDER_SITE_OTHER): Payer: Medicare HMO

## 2021-05-10 VITALS — BP 124/62 | HR 99 | Temp 98.4°F | Ht 65.0 in | Wt 144.9 lb

## 2021-05-10 DIAGNOSIS — I700 Atherosclerosis of aorta: Secondary | ICD-10-CM | POA: Diagnosis not present

## 2021-05-10 DIAGNOSIS — R059 Cough, unspecified: Secondary | ICD-10-CM | POA: Diagnosis not present

## 2021-05-10 DIAGNOSIS — E782 Mixed hyperlipidemia: Secondary | ICD-10-CM | POA: Diagnosis not present

## 2021-05-10 DIAGNOSIS — R7303 Prediabetes: Secondary | ICD-10-CM | POA: Diagnosis not present

## 2021-05-10 DIAGNOSIS — I10 Essential (primary) hypertension: Secondary | ICD-10-CM

## 2021-05-10 DIAGNOSIS — C4492 Squamous cell carcinoma of skin, unspecified: Secondary | ICD-10-CM | POA: Insufficient documentation

## 2021-05-10 DIAGNOSIS — J849 Interstitial pulmonary disease, unspecified: Secondary | ICD-10-CM

## 2021-05-10 HISTORY — DX: Squamous cell carcinoma of skin, unspecified: C44.92

## 2021-05-10 NOTE — Progress Notes (Signed)
? ?Subjective:  ?Patient ID: Amber Sullivan, female    DOB: 06/01/1955  Age: 66 y.o. MRN: [id number] ? ?CC: The primary encounter diagnosis was Cough in adult patient. Diagnoses of Prediabetes, Mixed hyperlipidemia, Aortic arch atherosclerosis ([place]), and Primary hypertension were also pertinent to this visit. ? ? ?This visit occurred during the SARS-CoV-2 public health emergency.  Safety protocols were in place, including screening questions prior to the visit, additional usage of staff PPE, and extensive cleaning of exam room while observing appropriate contact time as indicated for disinfecting solutions.   ? ?HPI ?Amber Sullivan presents for  ?Chief Complaint  ?Patient presents with  ? Follow-up  ? ? ?66 YR OLD FEMALE WITH HISTORY OF ALLERGIC RHINITIS presents with cough that has been persistent since February.  "Allergies":  symptoms started in February.  Saw ENT:  no sinus infection .regimen has been :  using flonase at night,  xyzal after supper ,  sudafed PE in the am ,  azelastine spray ,  stopped monteleukast after 2 doses due to it causing overly vivid dreams.  Using albuterol MDI 3 times daily for unclear reasons .  Denies wheezing. But states that it makes her chest less tight and she was also following the directions on the label.  Using [place] saline nasal spray . symptoms are not aggravated by her swimming 3 times per week  ? ?Husband diagnosed with pulmonary fibrosis   by dr Raul Del after having a persistent ocugh ? ?Using [place] rarely  mostly trazodone, using a  1/2 tablet plus melatonin every night  ? ?HTN  patient is taking he losartan as prescribed and notes no adverse effects.  Home BP readings have been done about once per week and are  generally < 130/80 .  She is avoiding added salt in her diet and walking regularly about 3 times per week for exercise  .  ? ?Outpatient Medications Prior to Visit  ?Medication Sig Dispense Refill  ? acetaminophen (TYLENOL) 500 MG tablet Take 500 mg by  mouth every 6 (six) hours as needed.    ? albuterol (VENTOLIN HFA) 108 (90 Base) MCG/ACT inhaler Inhale 2 puffs into the lungs every 6 (six) hours as needed for wheezing or shortness of breath. 18 g 2  ? AZELAIC ACID EX Apply topically. Azelaic Acid/Metronidizole/Ivermectin cream    ? benzonatate (TESSALON) 200 MG capsule Take 1 capsule (200 mg total) by mouth 3 (three) times daily as needed for cough. 90 capsule 1  ? Calcium Carbonate-Vit D-Min (CALTRATE 600+D PLUS MINERALS) 600-800 MG-UNIT TABS Take 1 tablet by mouth daily. Taking 1 tablet every other day    ? diclofenac Sodium (VOLTAREN) 1 % GEL Apply 4 g topically 4 (four) times daily. 100 g 2  ? diphenhydrAMINE (BENADRYL) 25 MG tablet Take 25 mg by mouth daily.    ? Doxycycline Hyclate 50 MG TABS Take by mouth as needed.    ? estradiol (ESTRACE VAGINAL) 0.1 MG/GM vaginal cream Place 1 Applicatorful vaginally 3 (three) times a week. 42.5 g 2  ? fluticasone (FLONASE) 50 MCG/ACT nasal spray Place 2 sprays into both nostrils daily. 48 g 1  ? levocetirizine (XYZAL) 5 MG tablet Take 1 tablet (5 mg total) by mouth every evening. 90 tablet 0  ? losartan (COZAAR) 100 MG tablet TAKE 1 TABLET BY MOUTH  DAILY 90 tablet 3  ? melatonin 5 MG TABS Take 5 mg by mouth.    ? rosuvastatin (CRESTOR) 10 MG tablet TAKE 1 TABLET  BY MOUTH  DAILY 90 tablet 3  ? traZODone (DESYREL) 50 MG tablet TAKE 1/2 TO 1 TABLET BY  MOUTH AT BEDTIME AS NEEDED  FOR SLEEP 90 tablet 3  ? zolpidem (AMBIEN) 5 MG tablet Take 1 tablet (5 mg total) by mouth at bedtime as needed for sleep. 30 tablet 5  ? pseudoephedrine (SUDAFED) 30 MG tablet Take 1 tablet (30 mg total) by mouth every 8 (eight) hours as needed for congestion. 30 tablet 0  ? azelastine (ASTELIN) 0.1 % nasal spray Place 2 sprays into both nostrils 2 (two) times daily for 7 days. Use in each nostril as directed 30 mL 0  ? montelukast (SINGULAIR) 10 MG tablet Take 1 tablet (10 mg total) by mouth at bedtime. (Patient not taking: Reported on  05/10/2021) 90 tablet 0  ? ?No facility-administered medications prior to visit.  ? ? ?Review of Systems; ? ?Patient denies headache, fevers, malaise, unintentional weight loss, skin rash, eye pain, sinus congestion and sinus pain, sore throat, dysphagia,  hemoptysis , , wheezing, chest pain, palpitations, orthopnea, edema, abdominal pain, nausea, melena, diarrhea, constipation, flank pain, dysuria, hematuria, urinary  Frequency, nocturia, numbness, tingling, seizures,  Focal weakness, Loss of consciousness,  Tremor, insomnia, depression, anxiety, and suicidal ideation.   ? ? ? ?Objective:  ?BP 124/62 (BP Location: Left Arm, Patient Position: Sitting, Cuff Size: Normal)   Pulse 99   Temp 98.4 ?F (36.9 ?C) (Oral)   Ht '5\' 5"'$  (1.651 m)   Wt 144 lb 14.4 oz (65.7 kg)   SpO2 97%   BMI 24.11 kg/m?  ? ?BP Readings from Last 3 Encounters:  ?05/10/21 124/62  ?03/05/21 133/69  ?02/21/21 122/68  ? ? ?Wt Readings from Last 3 Encounters:  ?05/10/21 144 lb 14.4 oz (65.7 kg)  ?02/21/21 143 lb 9.6 oz (65.1 kg)  ?11/30/20 141 lb (64 kg)  ? ? ?General appearance: alert, cooperative and appears stated age ?Ears: normal TM's and external ear canals both ears ?Throat: lips, mucosa, and tongue normal; teeth and gums normal ?Neck: no adenopathy, no carotid bruit, supple, symmetrical, trachea midline and thyroid not enlarged, symmetric, no tenderness/mass/nodules ?Back: symmetric, no curvature. ROM normal. No CVA tenderness. ?Lungs: clear to auscultation bilaterally ?Heart: regular rate and rhythm, S1, S2 normal, no murmur, click, rub or gallop ?Abdomen: soft, non-tender; bowel sounds normal; no masses,  no organomegaly ?Pulses: 2+ and symmetric ?Skin: Skin color, texture, turgor normal. No rashes or lesions ?Lymph nodes: Cervical, supraclavicular, and axillary nodes normal. ? ?Lab Results  ?Component Value Date  ? HGBA1C 5.9 05/07/2021  ? HGBA1C 5.7 11/07/2020  ? HGBA1C 6.0 11/02/2019  ? ? ?Lab Results  ?Component Value Date  ?  CREATININE 0.79 05/07/2021  ? CREATININE 0.81 11/07/2020  ? CREATININE 0.80 04/30/2020  ? ? ?Lab Results  ?Component Value Date  ? WBC 4.2 04/30/2020  ? HGB 13.6 04/30/2020  ? HCT 41.2 04/30/2020  ? PLT 202.0 04/30/2020  ? GLUCOSE 86 05/07/2021  ? CHOL 175 05/07/2021  ? TRIG 77.0 05/07/2021  ? HDL 66.20 05/07/2021  ? LDLDIRECT 109.0 10/25/2014  ? [place] 93 05/07/2021  ? ALT 19 05/07/2021  ? AST 19 05/07/2021  ? NA 139 05/07/2021  ? K 4.3 05/07/2021  ? CL 104 05/07/2021  ? CREATININE 0.79 05/07/2021  ? BUN 13 05/07/2021  ? CO2 28 05/07/2021  ? TSH 1.479 12/11/2019  ? HGBA1C 5.9 05/07/2021  ? MICROALBUR <0.7 04/30/2020  ? ? ?No results found. ? ?Assessment & Plan:  ? ?  Problem List Items Addressed This Visit   ? ? Hyperlipidemia  ?  Statin therapy initiated due to evidence of atherosclerotic disease in the aorta.  She is tolerating Crestor with excellent reduction in LDL and normal LFTs.  No changes today. ? ?Lab Results  ?Component Value Date  ? CHOL 175 05/07/2021  ? HDL 66.20 05/07/2021  ? [place] 93 05/07/2021  ? LDLDIRECT 109.0 10/25/2014  ? TRIG 77.0 05/07/2021  ? CHOLHDL 3 05/07/2021  ? ?Lab Results  ?Component Value Date  ? ALT 19 05/07/2021  ? AST 19 05/07/2021  ? ALKPHOS 71 05/07/2021  ? BILITOT 0.8 05/07/2021  ? ? ?  ?  ? Hypertension  ?  Well controlled on current losartan regimen. Renal function stable, no changes today. ? ?  ?  ? Aortic arch atherosclerosis ([place])  ?  Suggested on chest film from 2020.  She is tolerating high potency statin .  ? ?  ?  ? Cough in adult patient - Primary  ?  Present for > 8 weeks.  Chest x ray notes coarse interstitial markings ; she has no history of asthma.  CT chest advised  ? ?  ?  ? Relevant Orders  ? DG Chest 2 View (Completed)  ? Prediabetes  ?  She has lowered her a1c in the past into  normal range with increased exercise but has been less active due to prolonged post herpetic neuralgia and cough.  ? ?Lab Results  ?Component Value Date  ? HGBA1C 5.9 05/07/2021   ?ating mediterranean style  ?  ?  ? ? ?I spent a total of 30 minutes  with patient on the date of this encounter in a face to face visit , which included a review of her Urgent [facility] Visit in mid February  for otitis  followin

## 2021-05-10 NOTE — Patient Instructions (Signed)
Don't use the albuterol unless you feel like you are wheezing or unable to take a deep breath ? ? ?Ok to increase levocetirizine  to every 12 hours  ?

## 2021-05-12 ENCOUNTER — Other Ambulatory Visit: Payer: Self-pay | Admitting: Internal Medicine

## 2021-05-12 NOTE — Assessment & Plan Note (Signed)
Well controlled on current losartan regimen. Renal function stable, no changes today. ?

## 2021-05-12 NOTE — Assessment & Plan Note (Signed)
She has lowered her a1c in the past into  normal range with increased exercise but has been less active due to prolonged post herpetic neuralgia and cough.  ? ?Lab Results  ?Component Value Date  ? HGBA1C 5.9 05/07/2021  ?ating mediterranean style  ?

## 2021-05-12 NOTE — Assessment & Plan Note (Signed)
Suggested on chest film from 2020.  She is tolerating high potency statin .  

## 2021-05-12 NOTE — Assessment & Plan Note (Signed)
Present for > 8 weeks.  Chest x ray notes coarse interstitial markings ; she has no history of asthma.  CT chest advised  ?

## 2021-05-12 NOTE — Assessment & Plan Note (Signed)
Statin therapy initiated due to evidence of atherosclerotic disease in the aorta.  She is tolerating Crestor with excellent reduction in LDL and normal LFTs.  No changes today. ? ?Lab Results  ?Component Value Date  ? CHOL 175 05/07/2021  ? HDL 66.20 05/07/2021  ? [place] 93 05/07/2021  ? LDLDIRECT 109.0 10/25/2014  ? TRIG 77.0 05/07/2021  ? CHOLHDL 3 05/07/2021  ? ?Lab Results  ?Component Value Date  ? ALT 19 05/07/2021  ? AST 19 05/07/2021  ? ALKPHOS 71 05/07/2021  ? BILITOT 0.8 05/07/2021  ? ? ?

## 2021-05-21 ENCOUNTER — Encounter: Payer: Self-pay | Admitting: Internal Medicine

## 2021-05-21 DIAGNOSIS — R9389 Abnormal findings on diagnostic imaging of other specified body structures: Secondary | ICD-10-CM

## 2021-05-27 NOTE — Telephone Encounter (Signed)
I see it now it was sent to [place] [facility]. I moved it to our wq working on it now.  ?

## 2021-05-31 ENCOUNTER — Ambulatory Visit
Admission: RE | Admit: 2021-05-31 | Discharge: 2021-05-31 | Disposition: A | Discharge status: Home / Self Care | Payer: Medicare HMO | Source: Ambulatory Visit | Attending: Internal Medicine | Admitting: Internal Medicine

## 2021-05-31 DIAGNOSIS — R059 Cough, unspecified: Secondary | ICD-10-CM | POA: Diagnosis present

## 2021-05-31 DIAGNOSIS — J849 Interstitial pulmonary disease, unspecified: Secondary | ICD-10-CM | POA: Diagnosis present

## 2021-06-13 ENCOUNTER — Ambulatory Visit (INDEPENDENT_AMBULATORY_CARE_PROVIDER_SITE_OTHER): Payer: Medicare HMO

## 2021-06-13 VITALS — Ht 65.0 in | Wt 144.0 lb

## 2021-06-13 DIAGNOSIS — Z0000 Encounter for general adult medical examination without abnormal findings: Secondary | ICD-10-CM | POA: Diagnosis not present

## 2021-06-13 NOTE — Progress Notes (Signed)
Subjective:   Amber Sullivan is a 66 y.o. female who presents for Medicare Annual (Subsequent) preventive examination.  Review of Systems    No ROS.  Medicare Wellness Virtual Visit.  Visual/audio telehealth visit, UTA vital signs.   See social history for additional risk factors.   Cardiac Risk Factors include: advanced age (>87mn, >>81women)     Objective:    Today's Vitals   06/13/21 1249  Weight: 144 lb (65.3 kg)  Height: '5\' 5"'$  (1.651 m)   Body mass index is 23.96 kg/m.     06/12/2020    3:33 PM 12/11/2019    8:55 AM 11/26/2019    9:50 AM 08/13/2016    6:56 AM 05/19/2015    8:28 AM 11/21/2014    2:07 PM  Advanced Directives  Does Patient Have a Medical Advance Directive? No No No No No No  Does patient want to make changes to medical advance directive?      No - Patient declined  Would patient like information on creating a medical advance directive? No - Patient declined    No - patient declined information     Current Medications (verified) Outpatient Encounter Medications as of 06/13/2021  Medication Sig   zolpidem (AMBIEN) 5 MG tablet TAKE 1 TABLET BY MOUTH AT BEDTIME AS NEEDED FOR SLEEP.   acetaminophen (TYLENOL) 500 MG tablet Take 500 mg by mouth every 6 (six) hours as needed.   albuterol (VENTOLIN HFA) 108 (90 Base) MCG/ACT inhaler Inhale 2 puffs into the lungs every 6 (six) hours as needed for wheezing or shortness of breath.   AZELAIC ACID EX Apply topically. Azelaic Acid/Metronidizole/Ivermectin cream   azelastine (ASTELIN) 0.1 % nasal spray Place 2 sprays into both nostrils 2 (two) times daily for 7 days. Use in each nostril as directed   benzonatate (TESSALON) 200 MG capsule Take 1 capsule (200 mg total) by mouth 3 (three) times daily as needed for cough.   Calcium Carbonate-Vit D-Min (CALTRATE 600+D PLUS MINERALS) 600-800 MG-UNIT TABS Take 1 tablet by mouth daily. Taking 1 tablet every other day   diclofenac Sodium (VOLTAREN) 1 % GEL Apply 4 g  topically 4 (four) times daily.   diphenhydrAMINE (BENADRYL) 25 MG tablet Take 25 mg by mouth daily.   Doxycycline Hyclate 50 MG TABS Take by mouth as needed.   estradiol (ESTRACE VAGINAL) 0.1 MG/GM vaginal cream Place 1 Applicatorful vaginally 3 (three) times a week.   fluticasone (FLONASE) 50 MCG/ACT nasal spray Place 2 sprays into both nostrils daily.   levocetirizine (XYZAL) 5 MG tablet Take 1 tablet (5 mg total) by mouth every evening.   losartan (COZAAR) 100 MG tablet TAKE 1 TABLET BY MOUTH  DAILY   melatonin 5 MG TABS Take 5 mg by mouth.   montelukast (SINGULAIR) 10 MG tablet Take 1 tablet (10 mg total) by mouth at bedtime. (Patient not taking: Reported on 05/10/2021)   rosuvastatin (CRESTOR) 10 MG tablet TAKE 1 TABLET BY MOUTH  DAILY   traZODone (DESYREL) 50 MG tablet TAKE 1/2 TO 1 TABLET BY  MOUTH AT BEDTIME AS NEEDED  FOR SLEEP   No facility-administered encounter medications on file as of 06/13/2021.    Allergies (verified) Ace inhibitors   History: Past Medical History:  Diagnosis Date   Cancer of skin, squamous cell 05/10/2021   Fracture of lateral malleolus 2009   Fracture of lateral malleolus 01/21/2007   Hyperlipidemia    Hypertension 2003   Positive colorectal cancer screening using Cologuard  test 04/20/2016   Positive colorectal cancer screening using DNA-based stool test 04/28/2016   Positive cologuard.  June 2018.  Hyperplastic polyps, July 2018 colonosocpy   10 yr follow up ([facility])    Tendonitis of elbow, right 07/06/2019   Past Surgical History:  Procedure Laterality Date   CHOLECYSTECTOMY  2005   COLONOSCOPY WITH PROPOFOL N/A 08/13/2016   Procedure: COLONOSCOPY WITH PROPOFOL;  Surgeon: Robert Bellow, MD;  Location: [facility] ENDOSCOPY;  Service: Endoscopy;  Laterality: N/A;   MOHS SURGERY  2007   squamou cell carcinoma rt nasolabial fold   SQUAMOUS CELL CARCINOMA EXCISION  2007   mole on face   Family History  Problem Relation Age of Onset   Hypertension  Mother    Diabetes Mother        type 2   Stroke Mother 8       massive, with aphasia and paraplegia   Hypertension Father    Hyperlipidemia Father    Heart attack Father        vs PE during hospitalization for chest pain    Heart disease Father    Heart attack Maternal Grandfather    Heart disease Paternal Grandmother        CHF   Heart disease Paternal Grandfather    Heart attack Paternal Grandfather    Heart attack Brother 27   Breast cancer Neg Hx    Social History   Socioeconomic History   Marital status: Married    Spouse name: Not on file   Number of children: Not on file   Years of education: Not on file   Highest education level: Not on file  Occupational History   Not on file  Tobacco Use   Smoking status: Never   Smokeless tobacco: Never  Vaping Use   Vaping Use: Never used  Substance and Sexual Activity   Alcohol use: Not Currently    Comment: not much   Drug use: No   Sexual activity: Not on file  Other Topics Concern   Not on file  Social History Narrative   Not on file   Social Determinants of Health   Financial Resource Strain: Not on file  Food Insecurity: Not on file  Transportation Needs: Not on file  Physical Activity: Not on file  Stress: Not on file  Social Connections: Not on file    Tobacco Counseling Counseling given: Not Answered   Clinical Intake:  Pre-visit preparation completed: Yes        Diabetes: No  How often do you need to have someone help you when you read instructions, pamphlets, or other written materials from your doctor or pharmacy?: 1 - Never  Interpreter Needed?: No    Activities of Daily Living    06/13/2021   12:50 PM  In your present state of health, do you have any difficulty performing the following activities:  Hearing? 0  Vision? 0  Difficulty concentrating or making decisions? 0  Walking or climbing stairs? 0  Dressing or bathing? 0  Doing errands, shopping? 0  Preparing Food and  eating ? N  Using the Toilet? N  In the past six months, have you accidently leaked urine? N  Do you have problems with loss of bowel control? N  Managing your Medications? N  Managing your Finances? N  Housekeeping or managing your Housekeeping? N   Patient Care Team: Crecencio Mc, MD as PCP - General (Internal Medicine) Crecencio Mc, MD (Internal Medicine) Hervey Ard  W, MD (General Surgery)  Indicate any recent Medical Services you may have received from other than [facility] providers in the past year (date may be approximate).     Assessment:   This is a routine wellness examination for [place].  Virtual Visit via Telephone Note  I connected with  [name] on 06/13/21 at 12:45 PM EDT by telephone and verified that I am speaking with the correct person using two identifiers.  Persons participating in the virtual visit: patient/Nurse Health Advisor   I discussed the limitations of performing an evaluation and management service by telehealth. We continued and completed visit with audio only. Some vital signs may be absent or patient reported.   Hearing/Vision screen Hearing Screening - Comments:: Patient is able to hear conversational tones without difficulty. No issues reported. Vision Screening - Comments:: They have seen their ophthalmologist in the last 12 months.   Dietary issues and exercise activities discussed: Current Exercise Habits: Home exercise routine, Type of exercise: yoga;walking, Time (Minutes): > 60, Frequency (Times/Week): 5, Weekly Exercise (Minutes/Week): 0, Intensity: Moderate Healthy diet Good water intake   Goals Addressed             This Visit's Progress    Maintain Healthy Lifestyle   On track    Stay active  Healthy diet         Depression Screen    06/13/2021    1:16 PM 02/21/2021    8:18 AM 11/30/2020    9:07 AM 11/09/2020    9:20 AM 07/31/2020    1:24 PM 06/12/2020    2:53 PM 05/10/2020   11:56 AM  PHQ 2/9 Scores   PHQ - 2 Score 0 0 0 0 0 0 0  PHQ- 9 Score   0   0 1    Fall Risk    06/13/2021    1:15 PM 02/21/2021    8:18 AM 11/30/2020    9:07 AM 11/09/2020    9:20 AM 07/31/2020    1:23 PM  [place] in the past year? 0 0 0 0 1  Number falls in past yr: 0   0 1  Injury with Fall?    0 0  Risk for fall due to : No Fall Risks No Fall Risks No Fall Risks    Follow up Falls evaluation completed Falls evaluation completed Falls evaluation completed Falls evaluation completed Falls evaluation completed    [place]: Home free of loose throw rugs in walkways, pet beds, electrical cords, etc? Yes  Adequate lighting in your home to reduce risk of falls? Yes   ASSISTIVE DEVICES UTILIZED TO PREVENT FALLS: Life alert? No  Use of a cane, walker or w/c? No   TIMED UP AND GO: Was the test performed? No .   Cognitive Function:  Patient is alert and oriented x3.  Enjoys playing piano, crossword puzzles and other brain health activities.       Immunizations Immunization History  Administered Date(s) Administered   Fluad Quad(high Dose 65+) 11/03/2018   Influenza,inj,Quad PF,6+ Mos 10/12/2012, 10/25/2014   Influenza-Unspecified 11/16/2019, 10/31/2020   Moderna Covid-19 Vaccine Bivalent Booster 50yr & up 09/25/2020   PFIZER Comirnaty(Gray Top)Covid-19 Tri-Sucrose Vaccine 05/24/2020   PFIZER(Purple Top)SARS-COV-2 Vaccination 02/14/2019, 03/09/2019, 10/31/2019   Pneumococcal Conjugate-13 11/04/2019   Pneumococcal Polysaccharide-23 03/29/2004   Tdap 10/14/2007, 11/03/2018   Zoster Recombinat (Shingrix) 03/09/2020, 06/02/2020   Pneumococcal vaccine status: Due, Education has been provided regarding the  importance of this vaccine. Advised may receive this vaccine at local pharmacy or Health Dept. Aware to provide a copy of the vaccination record if obtained from local pharmacy or Health Dept. Verbalized acceptance and understanding.  Screening Tests Health  Maintenance  Topic Date Due   Pneumonia Vaccine 57+ Years old (83) 11/03/2020   INFLUENZA VACCINE  08/20/2021   MAMMOGRAM  11/20/2021   COLONOSCOPY (Pts 45-39yr Insurance coverage will need to be confirmed)  08/14/2026   TETANUS/TDAP  11/02/2028   DEXA SCAN  Completed   COVID-19 Vaccine  Completed   Hepatitis C Screening  Completed   Zoster Vaccines- Shingrix  Completed   HPV VACCINES  Aged Out   Health Maintenance Health Maintenance Due  Topic Date Due   Pneumonia Vaccine 68 Years old (354 11/03/2020   Lung Cancer Screening: (Low Dose CT Chest recommended if Age 68-80years, 30 pack-year currently smoking OR have quit w/in 15years.) does not qualify.   Vision Screening: Recommended annual ophthalmology exams for early detection of glaucoma and other disorders of the eye.  Dental Screening: Recommended annual dental exams for proper oral hygiene. Visits every 6 months.   Community Resource Referral / Chronic Care Management: CRR required this visit?  No   CCM required this visit?  No      Plan:     I have personally reviewed and noted the following in the patient's chart:   Medical and social history Use of alcohol, tobacco or illicit drugs  Current medications and supplements including opioid prescriptions.  Functional ability and status Nutritional status Physical activity Advanced directives List of other physicians Hospitalizations, surgeries, and ER visits in previous 12 months Vitals Screenings to include cognitive, depression, and falls Referrals and appointments  In addition, I have reviewed and discussed with patient certain preventive protocols, quality metrics, and best practice recommendations. A written personalized care plan for preventive services as well as general preventive health recommendations were provided to patient.     OVarney Biles LPN   [id number]

## 2021-06-13 NOTE — Patient Instructions (Addendum)
  Amber Sullivan , Thank you for taking time to come for your Medicare Wellness Visit. I appreciate your ongoing commitment to your health goals. Please review the following plan we discussed and let me know if I can assist you in the future.   These are the goals we discussed:  Goals      Maintain Healthy Lifestyle     Stay active  Healthy diet          This is a list of the screening recommended for you and due dates:  Health Maintenance  Topic Date Due   Pneumonia Vaccine (3) 11/03/2020   Flu Shot  08/20/2021   Mammogram  11/20/2021   Colon Cancer Screening  08/14/2026   Tetanus Vaccine  11/02/2028   DEXA scan (bone density measurement)  Completed   COVID-19 Vaccine  Completed   Hepatitis C Screening: USPSTF Recommendation to screen - Ages 68-79 yo.  Completed   Zoster (Shingles) Vaccine  Completed   HPV Vaccine  Aged Out

## 2021-06-24 ENCOUNTER — Ambulatory Visit: Payer: Medicare HMO | Admitting: Pulmonary Disease

## 2021-06-24 ENCOUNTER — Encounter: Payer: Self-pay | Admitting: Pulmonary Disease

## 2021-06-24 VITALS — BP 124/62 | HR 75 | Temp 97.9°F | Ht 65.0 in | Wt 145.0 lb

## 2021-06-24 DIAGNOSIS — J984 Other disorders of lung: Secondary | ICD-10-CM

## 2021-06-24 DIAGNOSIS — J45909 Unspecified asthma, uncomplicated: Secondary | ICD-10-CM | POA: Diagnosis not present

## 2021-06-24 DIAGNOSIS — R059 Cough, unspecified: Secondary | ICD-10-CM

## 2021-06-24 NOTE — Patient Instructions (Signed)
Continue your nasal hygiene and management of your nasal symptoms.  We will see you in follow-up in 3 months time we will have a breathing test done prior to the follow-up visit.

## 2021-06-24 NOTE — Progress Notes (Signed)
Subjective:    Patient ID: Amber Sullivan, female    DOB: [birth year], 66 y.o.   MRN: [id number] Patient Care Team: Crecencio Mc, MD as PCP - General (Internal Medicine) Crecencio Mc, MD (Internal Medicine) Bary Castilla Forest Gleason, MD (General Surgery)  Chief Complaint  Patient presents with   pulmonary consult    CT 06/03/2021-no current sx.    HPI The patient is a 66 year old lifelong never smoker who presents for evaluation of a cough first noted around January 2023.  She is kindly referred by Dr. Deborra Medina.  The patient notes that currently her symptoms have subsided.  He notes that in January her husband developed an upper respiratory infection after traveling from [place]  The cough was dry and accompanied by nasal symptoms of congestion and clear discharge.  In the interim she also developed some right ear pain and ringing.  She was treated with Flonase, Xyzal, Singulair and Sudafed and this resolved her nasal symptoms as well as her cough.  She was evaluated with a chest CT performed on 12 May which showed only some minimal scarring in the apices and peripheral posterior lung bases.  This is minimal.  This is not pulmonary fibrosis.  This is likely sequela from prior infection and/or inflammation.  The patient notes that she has seasonal triggers of nasal congestion which occasionally also has her developing some chest tightness.  That albuterol helps that chest tightness.  She has never been told she has asthma.  She has not had any fevers, chills or sweats.  No weight loss or anorexia.  Currently has no cough but did not have any sputum production when she had it.  No hemoptysis.  Currently does not endorse chest pain, orthopnea or paroxysmal nocturnal dyspnea.  No lower extremity edema, no calf tenderness.  She does not endorse any other symptomatology.  Usually has resided in [place] and [place], has been in [place] since 1992.  No occupational exposure.  No tobacco  use but her first husband was a heavy tobacco user and she had significant secondhand exposure.  DATA 05/31/2021 chest CT: No acute abnormality in the chest, mild scarring identified peripheral posterior lung bases, biapical pleural thickening.  Review of Systems A 10 point review of systems was performed and it is as noted above otherwise negative.  Past Medical History:  Diagnosis Date   Cancer of skin, squamous cell 05/10/2021   Fracture of lateral malleolus 2009   Fracture of lateral malleolus 01/21/2007   Hyperlipidemia    Hypertension 2003   Positive colorectal cancer screening using Cologuard test 04/20/2016   Positive colorectal cancer screening using DNA-based stool test 04/28/2016   Positive cologuard.  June 2018.  Hyperplastic polyps, July 2018 colonosocpy   10 yr follow up ([facility])    Tendonitis of elbow, right 07/06/2019   Past Surgical History:  Procedure Laterality Date   CHOLECYSTECTOMY  2005   COLONOSCOPY WITH PROPOFOL N/A 08/13/2016   Procedure: COLONOSCOPY WITH PROPOFOL;  Surgeon: Robert Bellow, MD;  Location: [facility] ENDOSCOPY;  Service: Endoscopy;  Laterality: N/A;   MOHS SURGERY  2007   squamou cell carcinoma rt nasolabial fold   SQUAMOUS CELL CARCINOMA EXCISION  2007   mole on face   Patient Active Problem List   Diagnosis Date Noted   Rosacea 11/11/2020   Inappropriate sinus node tachycardia 12/16/2019   Anxiety about health 12/16/2019   Post herpetic neuralgia 11/24/2019   Hyperplastic colon polyp 11/04/2019  Aortic arch atherosclerosis ([place]) 11/04/2019   Prediabetes 11/04/2019   Neoplasm of cheek 04/10/2019   Screening for breast cancer 04/29/2018   Travel advice encounter 10/25/2017   [place] arthritis 04/16/2015   BPPV (benign paroxysmal positional vertigo) 11/13/2014   Chronic insomnia 10/27/2014   Osteoporosis 10/27/2014   History of abnormal cervical Pap smear 03/30/2014   Cough in adult patient 02/17/2014   Long-term use of high-risk medication  01/30/2014   Encounter for preventive health examination 10/13/2012   History of Mohs surgery for squamous cell carcinoma in situ of skin 01/28/2012   Screening for colon cancer 01/28/2012   Hyperlipidemia    Hypertension    Family History  Problem Relation Age of Onset   Hypertension Mother    Diabetes Mother        type 2   Stroke Mother 33       massive, with aphasia and paraplegia   Hypertension Father    Hyperlipidemia Father    Heart attack Father        vs PE during hospitalization for chest pain    Heart disease Father    Heart attack Maternal Grandfather    Heart disease Paternal Grandmother        CHF   Heart disease Paternal Grandfather    Heart attack Paternal Grandfather    Heart attack Brother 68   Breast cancer Neg Hx    Social History   Tobacco Use   Smoking status: Never   Smokeless tobacco: Never  Substance Use Topics   Alcohol use: Not Currently    Comment: not much   Allergies  Allergen Reactions   Ace Inhibitors Cough   Current Meds  Medication Sig   acetaminophen (TYLENOL) 500 MG tablet Take 500 mg by mouth every 6 (six) hours as needed.   AZELAIC ACID EX Apply topically. Azelaic Acid/Metronidizole/Ivermectin cream   benzonatate (TESSALON) 200 MG capsule Take 1 capsule (200 mg total) by mouth 3 (three) times daily as needed for cough.   Calcium Carbonate-Vit D-Min (CALTRATE 600+D PLUS MINERALS) 600-800 MG-UNIT TABS Take 1 tablet by mouth daily. Taking 1 tablet every other day   Doxycycline Hyclate 50 MG TABS Take by mouth as needed.   fluticasone (FLONASE) 50 MCG/ACT nasal spray Place 2 sprays into both nostrils daily.   levocetirizine (XYZAL) 5 MG tablet Take 1 tablet (5 mg total) by mouth every evening.   losartan (COZAAR) 100 MG tablet TAKE 1 TABLET BY MOUTH  DAILY   melatonin 5 MG TABS Take 5 mg by mouth.   rosuvastatin (CRESTOR) 10 MG tablet TAKE 1 TABLET BY MOUTH  DAILY   traZODone (DESYREL) 50 MG tablet TAKE 1/2 TO 1 TABLET BY  MOUTH AT  BEDTIME AS NEEDED  FOR SLEEP   zolpidem (AMBIEN) 5 MG tablet TAKE 1 TABLET BY MOUTH AT BEDTIME AS NEEDED FOR SLEEP.   [DISCONTINUED] montelukast (SINGULAIR) 10 MG tablet Take 1 tablet (10 mg total) by mouth at bedtime.   Immunization History  Administered Date(s) Administered   Fluad Quad(high Dose 65+) 11/03/2018   Influenza,inj,Quad PF,6+ Mos 10/12/2012, 10/25/2014   Influenza-Unspecified 11/16/2019, 10/31/2020   Moderna Covid-19 Vaccine Bivalent Booster 28yr & up 09/25/2020   PFIZER Comirnaty(Gray Top)Covid-19 Tri-Sucrose Vaccine 05/24/2020   PFIZER(Purple Top)SARS-COV-2 Vaccination 02/14/2019, 03/09/2019, 10/31/2019   Pneumococcal Conjugate-13 11/04/2019   Pneumococcal Polysaccharide-23 03/29/2004   Tdap 10/14/2007, 11/03/2018   Zoster Recombinat (Shingrix) 03/09/2020, 06/02/2020      Objective:   Physical Exam BP 124/62 (BP Location:  Left Arm, Cuff Size: Normal)   Pulse 75   Temp 97.9 F (36.6 C) (Temporal)   Ht '5\' 5"'$  (1.651 m)   Wt 145 lb (65.8 kg)   SpO2 99%   BMI 24.13 kg/m  GENERAL: Well-developed, well-nourished woman, no acute distress.  Fully ambulatory.  No conversational dyspnea. HEAD: Normocephalic, atraumatic.  EYES: Pupils equal, round, reactive to light.  No scleral icterus.  MOUTH: Oral mucosa moist, no oral thrush. NECK: Supple. No thyromegaly. Trachea midline. No JVD.  No adenopathy. PULMONARY: Good air entry bilaterally.  No adventitious sounds. CARDIOVASCULAR: S1 and S2. Regular rate and rhythm.  No rubs, murmurs or gallops heard. ABDOMEN: Benign. MUSCULOSKELETAL: No joint deformity, no clubbing, no edema.  NEUROLOGIC: No overt focal deficit, no gait disturbance, speech is fluent. SKIN: Intact,warm,dry. PSYCH: Mood and behavior normal  Representative images from CT performed 31 May 2021, independently reviewed:  Mild biapical scarring (arrows)  Very mild bibasilar scarring (arrows) These likely represent sequela from prior infection/inflammation.   These are not progressive.  Assessment & Plan:     ICD-10-CM   1. Cough in adult patient  R05.9    Currently resolved [place] cough variant asthma    2. Uncomplicated asthma, unspecified asthma severity, unspecified whether persistent  J45.909 Pulmonary Function Test [facility] Only   PFTs Continue as needed albuterol    3. Scarring of lung  J98.4    Minimal Likely represents sequela from prior infection/inflammation Should not be progressive This is not pulmonary fibrosis     Orders Placed This Encounter  Procedures   Pulmonary Function Test [facility] Only    23mo   Standing Status:   Future    Standing Expiration Date:   06/25/2022    Order Specific Question:   Full PFT: includes the following: basic spirometry, spirometry pre & post bronchodilator, diffusion capacity (DLCO), lung volumes    Answer:   Full PFT   We will see the patient in follow-up in 3 months time she is to contact [facility] to that time should any new difficulties arise.  CRenold Don MD Advanced Bronchoscopy PCCM [facility]    *This note was dictated using voice recognition software/Dragon.  Despite best efforts to proofread, errors can occur which can change the meaning. Any transcriptional errors that result from this process are unintentional and may not be fully corrected at the time of dictation.

## 2021-06-29 ENCOUNTER — Other Ambulatory Visit: Payer: Self-pay

## 2021-06-29 ENCOUNTER — Ambulatory Visit
Admission: EM | Admit: 2021-06-29 | Discharge: 2021-06-29 | Disposition: A | Discharge status: Home / Self Care | Payer: Medicare HMO | Attending: Emergency Medicine | Admitting: Emergency Medicine

## 2021-06-29 ENCOUNTER — Encounter: Payer: Self-pay | Admitting: Emergency Medicine

## 2021-06-29 DIAGNOSIS — H6121 Impacted cerumen, right ear: Secondary | ICD-10-CM

## 2021-06-29 NOTE — Discharge Instructions (Addendum)
Continue with your current regime of medications you can work on daily wax buildup with over-the-counter wax removal products

## 2021-06-29 NOTE — ED Triage Notes (Signed)
Pt comes in c/o right ear fullness. States that it's not pain, it just feels like "the door has closed." States that it started this morning after taking a shower about an hour ago. States that she has had problems with this ear before.

## 2021-06-29 NOTE — ED Provider Notes (Signed)
[place]   MRN: [id number] DOB: [birth year]  Subjective:   Chief Complaint;  Chief Complaint  Patient presents with   Otalgia   Pt comes in c/o right ear fullness. States that it's not pain, it just feels like "the door has closed." States that it started this morning after taking a shower about an hour ago. States that she has had problems with this ear before  Merlene Dante is a 66 y.o. female presenting for right ear clogged feeling.  Patient is at problems in the past with eustachian tube dysfunction and had ENT referral.  She is currently on Zyrtec and Nasonex for control of her symptoms.  She denies fever or significant pain in her ear  No current facility-administered medications for this encounter.  Current Outpatient Medications:    acetaminophen (TYLENOL) 500 MG tablet, Take 500 mg by mouth every 6 (six) hours as needed., Disp: , Rfl:    AZELAIC ACID EX, Apply topically. Azelaic Acid/Metronidizole/Ivermectin cream, Disp: , Rfl:    Calcium Carbonate-Vit D-Min (CALTRATE 600+D PLUS MINERALS) 600-800 MG-UNIT TABS, Take 1 tablet by mouth daily. Taking 1 tablet every other day, Disp: , Rfl:    Doxycycline Hyclate 50 MG TABS, Take by mouth as needed., Disp: , Rfl:    fluticasone (FLONASE) 50 MCG/ACT nasal spray, Place 2 sprays into both nostrils daily., Disp: 48 g, Rfl: 1   levocetirizine (XYZAL) 5 MG tablet, Take 1 tablet (5 mg total) by mouth every evening., Disp: 90 tablet, Rfl: 0   losartan (COZAAR) 100 MG tablet, TAKE 1 TABLET BY MOUTH  DAILY, Disp: 90 tablet, Rfl: 3   melatonin 5 MG TABS, Take 5 mg by mouth., Disp: , Rfl:    rosuvastatin (CRESTOR) 10 MG tablet, TAKE 1 TABLET BY MOUTH  DAILY, Disp: 90 tablet, Rfl: 3   traZODone (DESYREL) 50 MG tablet, TAKE 1/2 TO 1 TABLET BY  MOUTH AT BEDTIME AS NEEDED  FOR SLEEP, Disp: 90 tablet, Rfl: 3   zolpidem (AMBIEN) 5 MG tablet, TAKE 1 TABLET BY MOUTH AT BEDTIME AS NEEDED FOR SLEEP., Disp: 30 tablet, Rfl: 5    azelastine (ASTELIN) 0.1 % nasal spray, Place 2 sprays into both nostrils 2 (two) times daily for 7 days. Use in each nostril as directed, Disp: 30 mL, Rfl: 0   benzonatate (TESSALON) 200 MG capsule, Take 1 capsule (200 mg total) by mouth 3 (three) times daily as needed for cough., Disp: 90 capsule, Rfl: 1   Allergies  Allergen Reactions   Ace Inhibitors Cough    Past Medical History:  Diagnosis Date   Cancer of skin, squamous cell 05/10/2021   Fracture of lateral malleolus 2009   Fracture of lateral malleolus 01/21/2007   Hyperlipidemia    Hypertension 2003   Positive colorectal cancer screening using Cologuard test 04/20/2016   Positive colorectal cancer screening using DNA-based stool test 04/28/2016   Positive cologuard.  June 2018.  Hyperplastic polyps, July 2018 colonosocpy   10 yr follow up ([facility])    Tendonitis of elbow, right 07/06/2019     ROS   Objective:   Vitals: BP 130/75 (BP Location: Left Arm)   Pulse 77   Temp 98.3 F (36.8 C) (Temporal)   Resp 16   Ht '5\' 5"'$  (1.651 m)   Wt 144 lb (65.3 kg)   SpO2 96%   BMI 23.96 kg/m   Physical Exam Constitutional:      Appearance: Normal appearance. She is not toxic-appearing.  HENT:     Head: Atraumatic.     Right Ear: Tympanic membrane and external ear normal. There is impacted cerumen.     Left Ear: Tympanic membrane and external ear normal.     Ears:     Comments: Post cerumen removal tm and canal nl    Nose: Nose normal. No congestion.     Mouth/Throat:     Mouth: Mucous membranes are moist.  Eyes:     General: No scleral icterus.       Right eye: No discharge.        Left eye: No discharge.     Conjunctiva/sclera: Conjunctivae normal.  Cardiovascular:     Rate and Rhythm: Normal rate and regular rhythm.  Pulmonary:     Effort: Pulmonary effort is normal. No respiratory distress.     Breath sounds: Normal breath sounds. No stridor. No wheezing, rhonchi or rales.  Chest:     Chest wall: No tenderness.   Skin:    General: Skin is warm and dry.  Neurological:     Mental Status: She is alert.  Psychiatric:        Mood and Affect: Mood normal.     No results found for this or any previous visit (from the past 24 hour(s)).  No results found.     Assessment and Plan :   1. Impacted cerumen of right ear     No orders of the defined types were placed in this encounter.   MDM:  Yeny Schmoll is a 66 y.o. female presenting for fullness to the right ear.  The ear canal did not show cerumen impaction blocking the TM.  The ear was irrigated and wax removed with flushing and with manual removal with curette.  Patient tolerated this procedure well post removal of cerumen the TM and canal showed no evidence of infection or acute inflammation.  Patient is instructed in home cerumen removal kits if this is a regular problem for her and to continue your current meds.  I discussed treatment, follow up and return instructions. Questions were answered. Patient/representative stated understanding of instructions and patient is stable for discharge.  Leida Lauth FNP-C MCN    Hezzie Bump, NP 06/29/21 [id number]

## 2021-07-21 ENCOUNTER — Encounter: Payer: Self-pay | Admitting: Internal Medicine

## 2021-07-24 ENCOUNTER — Other Ambulatory Visit: Payer: Self-pay | Admitting: Internal Medicine

## 2021-07-24 MED ORDER — AZELASTINE HCL 0.1 % NA SOLN: 2.0000 | Freq: Two times a day (BID) | NASAL | 1 refills | Status: DC

## 2021-08-21 ENCOUNTER — Other Ambulatory Visit: Payer: Self-pay | Admitting: Internal Medicine

## 2021-09-06 ENCOUNTER — Telehealth: Payer: Self-pay

## 2021-09-06 NOTE — Telephone Encounter (Signed)
Patient states she fell about six weeks ago and hit her elbow on kitchen cabinet.  Patient states she is able to do most activities, but if she leans wrong on her elbow, then there is a jolt of pain.  Patient states she would be interested in having an x-ray.  *Patient has been scheduled to see Dr. Deborra Medina on 10/14/2021.

## 2021-09-06 NOTE — Telephone Encounter (Signed)
Should pt be scheduled sooner than 10/14/2021?

## 2021-09-09 NOTE — Telephone Encounter (Signed)
Spoke with pt and she gave a verbal understanding.

## 2021-09-10 ENCOUNTER — Other Ambulatory Visit: Payer: Self-pay | Admitting: Internal Medicine

## 2021-09-12 ENCOUNTER — Encounter: Payer: Self-pay | Admitting: Internal Medicine

## 2021-09-26 ENCOUNTER — Encounter: Payer: Self-pay | Admitting: Pulmonary Disease

## 2021-09-26 ENCOUNTER — Ambulatory Visit: Payer: Medicare HMO | Admitting: Pulmonary Disease

## 2021-09-26 VITALS — BP 118/70 | HR 77 | Temp 97.8°F | Ht 65.0 in | Wt 142.8 lb

## 2021-09-26 DIAGNOSIS — J45909 Unspecified asthma, uncomplicated: Secondary | ICD-10-CM

## 2021-09-26 DIAGNOSIS — R059 Cough, unspecified: Secondary | ICD-10-CM | POA: Diagnosis not present

## 2021-09-26 NOTE — Patient Instructions (Signed)
We will see you back sometime in mid January.  Let us know if your symptoms flare prior to that time.

## 2021-09-26 NOTE — Progress Notes (Signed)
Subjective:    Patient ID: Amber Sullivan, female    DOB: 1956-01-03, 66 y.o.   MRN: [id number] Patient Care Team: Crecencio Mc, MD as PCP - General (Internal Medicine) Crecencio Mc, MD (Internal Medicine) Bary Castilla Forest Gleason, MD (General Surgery)  Chief Complaint  Patient presents with   Follow-up    No current sx. CT 06/03/2021   HPI Patient is a 66 year old lifelong never smoker who presents for follow-up on the issue of cough first noted around January 2023.  She was initially seen here on 24 June 2021.  This is a scheduled follow-up visit.  At her initial visit we had recommended PFTs to evaluate for potential cough variant asthma/seasonal asthma.  She however did not have these done.  Currently however she is asymptomatic.  She clearly has seasonal variation with her symptoms.  She states that her second worst times are usually around the wintertime.  Prior CT chest showed no significant abnormalities just some mild scarring likely residual from prior infection.  Currently does not endorse any cough, sputum production, nor hemoptysis.  No fevers, chills or sweats.  No other issues endorsed.  DATA 05/31/2021 chest CT: No acute abnormality in the chest, mild scarring identified peripheral posterior lung bases, biapical pleural thickening.  Review of Systems A 10 point review of systems was performed and it is as noted above otherwise negative.  Patient Active Problem List   Diagnosis Date Noted   Rosacea 11/11/2020   Inappropriate sinus node tachycardia 12/16/2019   Anxiety about health 12/16/2019   Post herpetic neuralgia 11/24/2019   Hyperplastic colon polyp 11/04/2019   Aortic arch atherosclerosis ([place]) 11/04/2019   Prediabetes 11/04/2019   Neoplasm of cheek 04/10/2019   Screening for breast cancer 04/29/2018   Travel advice encounter 10/25/2017   [place] arthritis 04/16/2015   BPPV (benign paroxysmal positional vertigo) 11/13/2014   Chronic insomnia 10/27/2014    Osteoporosis 10/27/2014   History of abnormal cervical Pap smear 03/30/2014   Cough in adult patient 02/17/2014   Long-term use of high-risk medication 01/30/2014   Encounter for preventive health examination 10/13/2012   History of Mohs surgery for squamous cell carcinoma in situ of skin 01/28/2012   Screening for colon cancer 01/28/2012   Hyperlipidemia    Hypertension    Social History   Tobacco Use   Smoking status: Never   Smokeless tobacco: Never  Substance Use Topics   Alcohol use: Not Currently    Comment: not much   Allergies  Allergen Reactions   Ace Inhibitors Cough   Immunization History  Administered Date(s) Administered   Fluad Quad(high Dose 65+) 11/03/2018   Influenza,inj,Quad PF,6+ Mos 10/12/2012, 10/25/2014   Influenza-Unspecified 11/16/2019, 10/31/2020   Moderna Covid-19 Vaccine Bivalent Booster 60yr & up 09/25/2020   PFIZER Comirnaty(Gray Top)Covid-19 Tri-Sucrose Vaccine 05/24/2020   PFIZER(Purple Top)SARS-COV-2 Vaccination 02/14/2019, 03/09/2019, 10/31/2019   Pneumococcal Conjugate-13 11/04/2019   Pneumococcal Polysaccharide-23 03/29/2004   Tdap 10/14/2007, 11/03/2018   Zoster Recombinat (Shingrix) 03/09/2020, 06/02/2020        Objective:   Physical Exam BP 118/70 (BP Location: Left Arm, Cuff Size: Normal)   Pulse 77   Temp 97.8 F (36.6 C) (Temporal)   Ht '5\' 5"'$  (1.651 m)   Wt 142 lb 12.8 oz (64.8 kg)   SpO2 97%   BMI 23.76 kg/m  GENERAL: Well-developed, well-nourished woman, no acute distress.  Fully ambulatory.  No conversational dyspnea. HEAD: Normocephalic, atraumatic.  EYES: Pupils equal, round, reactive to light.  No  scleral icterus.  MOUTH: Oral mucosa moist, no oral thrush. NECK: Supple. No thyromegaly. Trachea midline. No JVD.  No adenopathy. PULMONARY: Good air entry bilaterally.  No adventitious sounds. CARDIOVASCULAR: S1 and S2. Regular rate and rhythm.  No rubs, murmurs or gallops heard. ABDOMEN: Benign. MUSCULOSKELETAL: No  joint deformity, no clubbing, no edema.  NEUROLOGIC: No overt focal deficit, no gait disturbance, speech is fluent. SKIN: Intact,warm,dry. PSYCH: Mood and behavior normal    Assessment & Plan:     ICD-10-CM   1. Cough in adult patient  R05.9    Appears to be quiescent at present Past seasonal variation    2. Asthma due to seasonal allergies  J45.909    If symptoms recur she is to notify us Work-up for potential allergies if flares Postpone PFTs at patient's request Will need to reorder if flares     We will see the patient in follow-up mid January.  She is to contact us prior to that time should any new difficulties arise.   Renold Don, MD Advanced Bronchoscopy PCCM [facility]    *This note was dictated using voice recognition software/Dragon.  Despite best efforts to proofread, errors can occur which can change the meaning. Any transcriptional errors that result from this process are unintentional and may not be fully corrected at the time of dictation.

## 2021-09-29 ENCOUNTER — Other Ambulatory Visit: Payer: Self-pay | Admitting: Internal Medicine

## 2021-10-14 ENCOUNTER — Ambulatory Visit: Payer: Medicare HMO | Admitting: Internal Medicine

## 2021-10-23 ENCOUNTER — Telehealth: Payer: Self-pay

## 2021-10-23 DIAGNOSIS — Z79899 Other long term (current) drug therapy: Secondary | ICD-10-CM

## 2021-10-23 DIAGNOSIS — I10 Essential (primary) hypertension: Secondary | ICD-10-CM

## 2021-10-23 DIAGNOSIS — R7301 Impaired fasting glucose: Secondary | ICD-10-CM

## 2021-10-23 DIAGNOSIS — E782 Mixed hyperlipidemia: Secondary | ICD-10-CM

## 2021-10-23 NOTE — Telephone Encounter (Signed)
I have pended labs for your approval.  

## 2021-10-23 NOTE — Telephone Encounter (Signed)
Patient states she has an appointment scheduled with Dr. Deborra Medina on 11/11/2021, and she would like to know if Dr. Derrel Nip would like for her to come in for labs before her appointment.  I let patient know that we do not currently have lab orders for her, so I will send a note back to Dr. Derrel Nip.

## 2021-11-04 ENCOUNTER — Other Ambulatory Visit (INDEPENDENT_AMBULATORY_CARE_PROVIDER_SITE_OTHER): Payer: Medicare HMO

## 2021-11-04 DIAGNOSIS — Z79899 Other long term (current) drug therapy: Secondary | ICD-10-CM | POA: Diagnosis not present

## 2021-11-04 DIAGNOSIS — E782 Mixed hyperlipidemia: Secondary | ICD-10-CM | POA: Diagnosis not present

## 2021-11-04 DIAGNOSIS — R7301 Impaired fasting glucose: Secondary | ICD-10-CM

## 2021-11-04 DIAGNOSIS — I10 Essential (primary) hypertension: Secondary | ICD-10-CM

## 2021-11-04 LAB — MICROALBUMIN / CREATININE URINE RATIO
Creatinine,U: 126 mg/dL
Microalb Creat Ratio: 7.1 mg/g (ref 0.0–30.0)
Microalb, Ur: 8.9 mg/dL — ABNORMAL HIGH (ref 0.0–1.9)

## 2021-11-04 LAB — COMPREHENSIVE METABOLIC PANEL
ALT: 15 U/L (ref 0–35)
AST: 13 U/L (ref 0–37)
Albumin: 3.8 g/dL (ref 3.5–5.2)
Alkaline Phosphatase: 63 U/L (ref 39–117)
BUN: 16 mg/dL (ref 6–23)
CO2: 28 mEq/L (ref 19–32)
Calcium: 8.9 mg/dL (ref 8.4–10.5)
Chloride: 107 mEq/L (ref 96–112)
Creatinine, Ser: 0.73 mg/dL (ref 0.40–1.20)
GFR: 84.71 mL/min (ref >60.00)
Glucose, Bld: 96 mg/dL (ref 70–99)
Potassium: 4.4 mEq/L (ref 3.5–5.1)
Sodium: 142 mEq/L (ref 135–145)
Total Bilirubin: 0.7 mg/dL (ref 0.2–1.2)
Total Protein: 6 g/dL (ref 6.0–8.3)

## 2021-11-04 LAB — LIPID PANEL
Cholesterol: 163 mg/dL (ref 0–200)
HDL: 61.5 mg/dL (ref >39.00)
LDL Cholesterol: 90 mg/dL (ref 0–99)
NonHDL: 101.73
Total CHOL/HDL Ratio: 3
Triglycerides: 58 mg/dL (ref 0.0–149.0)
VLDL: 11.6 mg/dL (ref 0.0–40.0)

## 2021-11-04 LAB — CBC WITH DIFFERENTIAL/PLATELET
Basophils Absolute: 0 10*3/uL (ref 0.0–0.1)
Basophils Relative: 1 % (ref 0.0–3.0)
Eosinophils Absolute: 0.1 10*3/uL (ref 0.0–0.7)
Eosinophils Relative: 2.5 % (ref 0.0–5.0)
HCT: 38.7 % (ref 36.0–46.0)
Hemoglobin: 12.8 g/dL (ref 12.0–15.0)
Lymphocytes Relative: 26.3 % (ref 12.0–46.0)
Lymphs Abs: 1.1 10*3/uL (ref 0.7–4.0)
MCHC: 33.2 g/dL (ref 30.0–36.0)
MCV: 94.2 fl (ref 78.0–100.0)
Monocytes Absolute: 0.6 10*3/uL (ref 0.1–1.0)
Monocytes Relative: 13.6 % — ABNORMAL HIGH (ref 3.0–12.0)
Neutro Abs: 2.4 10*3/uL (ref 1.4–7.7)
Neutrophils Relative %: 56.6 % (ref 43.0–77.0)
Platelets: 197 10*3/uL (ref 150.0–400.0)
RBC: 4.11 Mil/uL (ref 3.87–5.11)
RDW: 13.8 % (ref 11.5–15.5)
WBC: 4.2 10*3/uL (ref 4.0–10.5)

## 2021-11-04 LAB — TSH: TSH: 1.9 u[IU]/mL (ref 0.35–5.50)

## 2021-11-04 LAB — HEMOGLOBIN A1C: Hgb A1c MFr Bld: 6 % (ref 4.6–6.5)

## 2021-11-08 ENCOUNTER — Other Ambulatory Visit: Payer: Self-pay | Admitting: Internal Medicine

## 2021-11-08 DIAGNOSIS — Z1231 Encounter for screening mammogram for malignant neoplasm of breast: Secondary | ICD-10-CM

## 2021-11-11 ENCOUNTER — Ambulatory Visit (INDEPENDENT_AMBULATORY_CARE_PROVIDER_SITE_OTHER): Payer: Medicare HMO | Admitting: Internal Medicine

## 2021-11-11 ENCOUNTER — Encounter: Payer: Self-pay | Admitting: Internal Medicine

## 2021-11-11 VITALS — BP 124/70 | HR 79 | Temp 97.6°F | Ht 65.0 in | Wt 144.0 lb

## 2021-11-11 DIAGNOSIS — R7303 Prediabetes: Secondary | ICD-10-CM | POA: Diagnosis not present

## 2021-11-11 DIAGNOSIS — F5104 Psychophysiologic insomnia: Secondary | ICD-10-CM

## 2021-11-11 DIAGNOSIS — I10 Essential (primary) hypertension: Secondary | ICD-10-CM | POA: Diagnosis not present

## 2021-11-11 DIAGNOSIS — M810 Age-related osteoporosis without current pathological fracture: Secondary | ICD-10-CM

## 2021-11-11 DIAGNOSIS — E782 Mixed hyperlipidemia: Secondary | ICD-10-CM | POA: Diagnosis not present

## 2021-11-11 DIAGNOSIS — I700 Atherosclerosis of aorta: Secondary | ICD-10-CM

## 2021-11-11 DIAGNOSIS — R059 Cough, unspecified: Secondary | ICD-10-CM

## 2021-11-11 MED ORDER — LEVOCETIRIZINE DIHYDROCHLORIDE 5 MG PO TABS: 5.0000 mg | ORAL_TABLET | Freq: Every evening | ORAL | 3 refills | Status: DC

## 2021-11-11 NOTE — Assessment & Plan Note (Signed)
She has lowered her a1c in the past into  normal range with increased exercise but has been less active due to prolonged post herpetic neuralgia and cough.   Lab Results  Component Value Date   HGBA1C 6.0 11/04/2021

## 2021-11-11 NOTE — Assessment & Plan Note (Signed)
Suggested on chest film from 2020.  She is tolerating high potency statin .

## 2021-11-11 NOTE — Assessment & Plan Note (Addendum)
Referred to Dr Patsey Berthold for evaluation following CT chest which noted scarring . PFTS were ordered but not done and cough  Had resolved at time of evaluation in May. Prior PFTS in 2012 were normal . Continue treatment for allergic rhiniitis

## 2021-11-11 NOTE — Progress Notes (Signed)
Subjective:  Patient ID: Amber Sullivan, female    DOB: 1955/06/01  Age: 66 y.o. MRN: [id number]  CC: The primary encounter diagnosis was Primary hypertension. Diagnoses of Mixed hyperlipidemia, Prediabetes, Age-related osteoporosis without current pathological fracture, Chronic insomnia, Aortic arch atherosclerosis ([place]), and Cough in adult patient were also pertinent to this visit.   HPI Amber Sullivan presents for follow up on hypertension, hyperipidemia, insomnia  Chief Complaint  Patient presents with   Follow-up    6 month follow up     1) Hypertension: patient checks blood pressure twice weekly at home.  Readings on a calibrated machine have been under 130/80 virtually all of the time . Patient is exercisng daily,  following a reduced salt diet most days and is taking losartan 100 mg daily as prescribed   2) Aortic atherosclerosis:   Patient is tolerating high potency statin therapy  for risk reduction and  stabilization of atherosclerotic placque noted on prior abdominal CT which was reviewed during today's visit   3) Insomnia;  Using trazodone regularly on a nightly basis and uses 1/2 ambien as needed .  Sleeping better since retiring.   4) allergic rhinitis/Eustachian tube dysfunction:  she is using azelastine and Xzal daily, has reduce use of  flonase to every 3 days to avoid epistaxis  Outpatient Medications Prior to Visit  Medication Sig Dispense Refill   AZELAIC ACID EX Apply topically. Azelaic Acid/Metronidizole/Ivermectin cream     azelastine (ASTELIN) 0.1 % nasal spray Place 2 sprays into both nostrils 2 (two) times daily. Use in each nostril as directed 30 mL 1   benzonatate (TESSALON) 200 MG capsule Take 1 capsule (200 mg total) by mouth 3 (three) times daily as needed for cough. 90 capsule 1   Calcium Carbonate-Vit D-Min (CALTRATE 600+D PLUS MINERALS) 600-800 MG-UNIT TABS Take 1 tablet by mouth daily. Taking 1 tablet every other day     Doxycycline Hyclate  50 MG TABS Take by mouth as needed.     fluticasone (FLONASE) 50 MCG/ACT nasal spray Place 2 sprays into both nostrils daily. 48 g 1   losartan (COZAAR) 100 MG tablet TAKE 1 TABLET BY MOUTH  DAILY 90 tablet 3   melatonin 5 MG TABS Take 5 mg by mouth.     rosuvastatin (CRESTOR) 10 MG tablet TAKE 1 TABLET BY MOUTH  DAILY 90 tablet 3   traZODone (DESYREL) 50 MG tablet TAKE 1/2 TO 1 TABLET BY  MOUTH AT BEDTIME AS NEEDED  FOR SLEEP 90 tablet 3   zolpidem (AMBIEN) 5 MG tablet TAKE 1 TABLET BY MOUTH AT BEDTIME AS NEEDED FOR SLEEP. 30 tablet 5   levocetirizine (XYZAL) 5 MG tablet TAKE 1 TABLET BY MOUTH IN THE  EVENING 90 tablet 0   No facility-administered medications prior to visit.    Review of Systems;  Patient denies headache, fevers, malaise, unintentional weight loss, skin rash, eye pain, sinus congestion and sinus pain, sore throat, dysphagia,  hemoptysis , cough, dyspnea, wheezing, chest pain, palpitations, orthopnea, edema, abdominal pain, nausea, melena, diarrhea, constipation, flank pain, dysuria, hematuria, urinary  Frequency, nocturia, numbness, tingling, seizures,  Focal weakness, Loss of consciousness,  Tremor, insomnia, depression, anxiety, and suicidal ideation.      Objective:  BP 124/70   Pulse 79   Temp 97.6 F (36.4 C) (Oral)   Ht '5\' 5"'$  (1.651 m)   Wt 144 lb (65.3 kg)   SpO2 99%   BMI 23.96 kg/m   BP Readings from Last  3 Encounters:  11/11/21 124/70  09/26/21 118/70  06/29/21 130/75    Wt Readings from Last 3 Encounters:  11/11/21 144 lb (65.3 kg)  09/26/21 142 lb 12.8 oz (64.8 kg)  06/29/21 144 lb (65.3 kg)    General appearance: alert, cooperative and appears stated age Ears: normal TM's and external ear canals both ears Throat: lips, mucosa, and tongue normal; teeth and gums normal Neck: no adenopathy, no carotid bruit, supple, symmetrical, trachea midline and thyroid not enlarged, symmetric, no tenderness/mass/nodules Back: symmetric, no curvature. ROM  normal. No CVA tenderness. Lungs: clear to auscultation bilaterally Heart: regular rate and rhythm, S1, S2 normal, no murmur, click, rub or gallop Abdomen: soft, non-tender; bowel sounds normal; no masses,  no organomegaly Pulses: 2+ and symmetric Skin: Skin color, texture, turgor normal. No rashes or lesions Lymph nodes: Cervical, supraclavicular, and axillary nodes normal. Neuro:  awake and interactive with normal mood and affect. Higher cortical functions are normal. Speech is clear without word-finding difficulty or dysarthria. Extraocular movements are intact. Visual fields of both eyes are grossly intact. Sensation to light touch is grossly intact bilaterally of upper and lower extremities. Motor examination shows 4+/5 symmetric hand grip and upper extremity and 5/5 lower extremity strength. There is no pronation or drift. Gait is non-ataxic   Lab Results  Component Value Date   HGBA1C 6.0 11/04/2021   HGBA1C 5.9 05/07/2021   HGBA1C 5.7 11/07/2020    Lab Results  Component Value Date   CREATININE 0.73 11/04/2021   CREATININE 0.79 05/07/2021   CREATININE 0.81 11/07/2020    Lab Results  Component Value Date   WBC 4.2 11/04/2021   HGB 12.8 11/04/2021   HCT 38.7 11/04/2021   PLT 197.0 11/04/2021   GLUCOSE 96 11/04/2021   CHOL 163 11/04/2021   TRIG 58.0 11/04/2021   HDL 61.50 11/04/2021   LDLDIRECT 109.0 10/25/2014   LDLCALC 90 11/04/2021   ALT 15 11/04/2021   AST 13 11/04/2021   NA 142 11/04/2021   K 4.4 11/04/2021   CL 107 11/04/2021   CREATININE 0.73 11/04/2021   BUN 16 11/04/2021   CO2 28 11/04/2021   TSH 1.90 11/04/2021   HGBA1C 6.0 11/04/2021   MICROALBUR 8.9 (H) 11/04/2021    No results found.  Assessment & Plan:   Problem List Items Addressed This Visit     Prediabetes    She has lowered her a1c in the past into  normal range with increased exercise but has been less active due to prolonged post herpetic neuralgia and cough.   Lab Results  Component  Value Date   HGBA1C 6.0 11/04/2021        Relevant Orders   Comprehensive metabolic panel   Hemoglobin A1c   Osteoporosis    Declines treatment       Hypertension - Primary   Relevant Orders   Comprehensive metabolic panel   Hyperlipidemia    LDL is at goal on rosuvastatin   Lab Results  Component Value Date   CHOL 163 11/04/2021   HDL 61.50 11/04/2021   LDLCALC 90 11/04/2021   LDLDIRECT 109.0 10/25/2014   TRIG 58.0 11/04/2021   CHOLHDL 3 11/04/2021         Relevant Orders   Lipid panel   LDL cholesterol, direct   Cough in adult patient    Referred to Dr Patsey Berthold for evaluation following CT chest which noted scarring . PFTS were ordered but not done and cough  Had resolved at time of  evaluation in May. Prior PFTS in 2012 were normal . Continue treatment for allergic rhiniitis      Chronic insomnia    Managed with trazodone.  usses ambien sparingly for early wakeups       Aortic arch atherosclerosis ([place])    Suggested on chest film from 2020.  She is tolerating high potency statin .        I spent a total of   38  minutes with this patient in a face to face visit on the date of this encounter reviewing the last office visit with me   April,   her most recent visit with Dr. Patsey Berthold,  prior PFtTS, and CT chest ,   patient's diet and exercise habits, home blood pressure readings   and post visit ordering of testing and therapeutics.    Follow-up: Return in about 6 months (around 05/13/2022).   Crecencio Mc, MD

## 2021-11-11 NOTE — Assessment & Plan Note (Signed)
LDL is at goal on rosuvastatin   Lab Results  Component Value Date   CHOL 163 11/04/2021   HDL 61.50 11/04/2021   LDLCALC 90 11/04/2021   LDLDIRECT 109.0 10/25/2014   TRIG 58.0 11/04/2021   CHOLHDL 3 11/04/2021

## 2021-11-11 NOTE — Assessment & Plan Note (Signed)
Managed with trazodone.  usses ambien sparingly for early [employer]

## 2021-11-11 NOTE — Assessment & Plan Note (Signed)
Declines treatment

## 2021-11-26 ENCOUNTER — Ambulatory Visit
Admission: RE | Admit: 2021-11-26 | Discharge: 2021-11-26 | Disposition: A | Discharge status: Home / Self Care | Payer: Medicare HMO | Source: Ambulatory Visit | Attending: Internal Medicine | Admitting: Internal Medicine

## 2021-11-26 DIAGNOSIS — Z1231 Encounter for screening mammogram for malignant neoplasm of breast: Secondary | ICD-10-CM | POA: Insufficient documentation

## 2021-12-09 ENCOUNTER — Other Ambulatory Visit: Payer: Self-pay | Admitting: Internal Medicine

## 2022-02-06 ENCOUNTER — Other Ambulatory Visit: Payer: Self-pay | Admitting: Internal Medicine

## 2022-02-16 ENCOUNTER — Other Ambulatory Visit: Payer: Self-pay | Admitting: Family

## 2022-03-19 ENCOUNTER — Other Ambulatory Visit: Payer: Self-pay

## 2022-03-19 MED ORDER — LOSARTAN POTASSIUM 100 MG PO TABS: 100.0000 mg | ORAL_TABLET | Freq: Every day | ORAL | 3 refills | Status: DC

## 2022-04-14 ENCOUNTER — Other Ambulatory Visit: Payer: Self-pay | Admitting: Internal Medicine

## 2022-05-12 ENCOUNTER — Other Ambulatory Visit (INDEPENDENT_AMBULATORY_CARE_PROVIDER_SITE_OTHER): Payer: Medicare HMO

## 2022-05-12 DIAGNOSIS — R7303 Prediabetes: Secondary | ICD-10-CM

## 2022-05-12 DIAGNOSIS — I10 Essential (primary) hypertension: Secondary | ICD-10-CM | POA: Diagnosis not present

## 2022-05-12 DIAGNOSIS — E782 Mixed hyperlipidemia: Secondary | ICD-10-CM

## 2022-05-12 LAB — LIPID PANEL
Cholesterol: 147 mg/dL (ref 0–200)
HDL: 55.1 mg/dL (ref >39.00)
LDL Cholesterol: 76 mg/dL (ref 0–99)
NonHDL: 91.61
Total CHOL/HDL Ratio: 3
Triglycerides: 80 mg/dL (ref 0.0–149.0)
VLDL: 16 mg/dL (ref 0.0–40.0)

## 2022-05-12 LAB — HEMOGLOBIN A1C: Hgb A1c MFr Bld: 5.9 % (ref 4.6–6.5)

## 2022-05-12 LAB — COMPREHENSIVE METABOLIC PANEL
ALT: 16 U/L (ref 0–35)
AST: 18 U/L (ref 0–37)
Albumin: 3.9 g/dL (ref 3.5–5.2)
Alkaline Phosphatase: 64 U/L (ref 39–117)
BUN: 16 mg/dL (ref 6–23)
CO2: 25 mEq/L (ref 19–32)
Calcium: 8.8 mg/dL (ref 8.4–10.5)
Chloride: 107 mEq/L (ref 96–112)
Creatinine, Ser: 0.8 mg/dL (ref 0.40–1.20)
GFR: 75.62 mL/min (ref >60.00)
Glucose, Bld: 92 mg/dL (ref 70–99)
Potassium: 4 mEq/L (ref 3.5–5.1)
Sodium: 139 mEq/L (ref 135–145)
Total Bilirubin: 0.7 mg/dL (ref 0.2–1.2)
Total Protein: 6.4 g/dL (ref 6.0–8.3)

## 2022-05-12 LAB — LDL CHOLESTEROL, DIRECT: Direct LDL: 77 mg/dL

## 2022-05-16 ENCOUNTER — Encounter: Payer: Medicare HMO | Admitting: Internal Medicine

## 2022-05-22 ENCOUNTER — Encounter: Payer: Self-pay | Admitting: Internal Medicine

## 2022-05-22 ENCOUNTER — Ambulatory Visit (INDEPENDENT_AMBULATORY_CARE_PROVIDER_SITE_OTHER): Payer: Medicare HMO | Admitting: Internal Medicine

## 2022-05-22 VITALS — BP 120/58 | HR 73 | Temp 98.2°F | Ht 65.0 in | Wt 144.0 lb

## 2022-05-22 DIAGNOSIS — M810 Age-related osteoporosis without current pathological fracture: Secondary | ICD-10-CM | POA: Diagnosis not present

## 2022-05-22 DIAGNOSIS — I700 Atherosclerosis of aorta: Secondary | ICD-10-CM

## 2022-05-22 DIAGNOSIS — F5104 Psychophysiologic insomnia: Secondary | ICD-10-CM | POA: Diagnosis not present

## 2022-05-22 DIAGNOSIS — Z0000 Encounter for general adult medical examination without abnormal findings: Secondary | ICD-10-CM

## 2022-05-22 MED ORDER — AZELASTINE HCL 137 MCG/SPRAY NA SOLN: NASAL | 11 refills | Status: DC

## 2022-05-22 MED ORDER — DOXYCYCLINE HYCLATE 100 MG PO TABS
100.0000 mg | ORAL_TABLET | Freq: Two times a day (BID) | ORAL | 0 refills | Status: DC
Start: 2022-05-22 — End: 2022-11-25

## 2022-05-22 MED ORDER — TRIAMCINOLONE ACETONIDE 0.025 % EX OINT: 1.0000 | TOPICAL_OINTMENT | Freq: Two times a day (BID) | CUTANEOUS | 0 refills | Status: DC

## 2022-05-22 NOTE — Patient Instructions (Addendum)
  [facility] Spotted Fever  can present with fever and a headache, NOT a rash; s the occurrence of these symptoms during spring/summer/fall in the context of recent tick exposure is RMSF until proven otherwise and should be treated immediately with doxycycline.  I have sent this rx to your pharmacy to use if you develop these symptoms.  Let me know if this occurs so we can set you up for the appropriate testing   Triamcinolone ointment sent to use for any contact dermatitis/insect bites.   See you in 6 months!

## 2022-05-22 NOTE — Progress Notes (Signed)
Patient ID: Amber Sullivan, female    DOB: Feb 05, 1955  Age: 67 y.o. MRN: [id number]  The patient is here for annual preventive examination and management of other chronic and acute problems.   The risk factors are reflected in the social history.   The roster of all physicians providing medical care to patient - is listed in the Snapshot section of the chart.   Activities of daily living:  The patient is 100% independent in all ADLs: dressing, toileting, feeding as well as independent mobility   Home safety : The patient has smoke detectors in the home. They wear seatbelts.  There are no unsecured firearms at home. There is no violence in the home.    There is no risks for hepatitis, STDs or HIV. There is no   history of blood transfusion. They have no travel history to infectious disease endemic areas of the world.   The patient has seen their dentist in the last six month. They have seen their eye doctor in the last year. The patinet  denies slight hearing difficulty with regard to whispered voices and some television programs.  They have deferred audiologic testing in the last year.  They do not  have excessive sun exposure. Discussed the need for sun protection: hats, long sleeves and use of sunscreen if there is significant sun exposure.    Diet: the importance of a healthy diet is discussed. They do have a healthy diet.   The benefits of regular aerobic exercise were discussed. The patient  exercises  3 to 5 days per week  for  60 minutes.    Depression screen: there are no signs or vegative symptoms of depression- irritability, change in appetite, anhedonia, sadness/tearfullness.   The following portions of the patient's history were reviewed and updated as appropriate: allergies, current medications, past family history, past medical history,  past surgical history, past social history  and problem list.   Visual acuity was not assessed per patient preference since the patient has  regular follow up with an  ophthalmologist. Hearing and body mass index were assessed and reviewed.    During the course of the visit the patient was educated and counseled about appropriate screening and preventive services including : fall prevention , diabetes screening, nutrition counseling, colorectal cancer screening, and recommended immunizations.    Chief Complaint:   none. Swimming 4 days per week    Review of Symptoms  Patient denies headache, fevers, malaise, unintentional weight loss, skin rash, eye pain, sinus congestion and sinus pain, sore throat, dysphagia,  hemoptysis , cough, dyspnea, wheezing, chest pain, palpitations, orthopnea, edema, abdominal pain, nausea, melena, diarrhea, constipation, flank pain, dysuria, hematuria, urinary  Frequency, nocturia, numbness, tingling, seizures,  Focal weakness, Loss of consciousness,  Tremor, insomnia, depression, anxiety, and suicidal ideation.    Physical Exam:  BP (!) 120/58   Pulse 73   Temp 98.2 F (36.8 C) (Oral)   Ht 5\' 5"  (1.651 m)   Wt 144 lb (65.3 kg)   SpO2 98%   BMI 23.96 kg/m    Physical Exam Vitals reviewed.  Constitutional:      General: She is not in acute distress.    Appearance: Normal appearance. She is normal weight. She is not ill-appearing, toxic-appearing or diaphoretic.  HENT:     Head: Normocephalic.  Eyes:     General: No scleral icterus.       Right eye: No discharge.        Left eye: No  discharge.     Conjunctiva/sclera: Conjunctivae normal.  Cardiovascular:     Rate and Rhythm: Normal rate and regular rhythm.     Heart sounds: Normal heart sounds.  Pulmonary:     Effort: Pulmonary effort is normal. No respiratory distress.     Breath sounds: Normal breath sounds.  Musculoskeletal:        General: Normal range of motion.  Skin:    General: Skin is warm and dry.  Neurological:     General: No focal deficit present.     Mental Status: She is alert and oriented to person, place, and  time. Mental status is at baseline.  Psychiatric:        Mood and Affect: Mood normal.        Behavior: Behavior normal.        Thought Content: Thought content normal.        Judgment: Judgment normal.     Assessment and Plan: Aortic arch atherosclerosis ([facility]) Assessment & Plan: Suggested on chest film from 2020.  She is tolerating high potency statin .   Lab Results  Component Value Date   CHOL 147 05/12/2022   HDL 55.10 05/12/2022   LDLCALC 76 05/12/2022   LDLDIRECT 77.0 05/12/2022   TRIG 80.0 05/12/2022   CHOLHDL 3 05/12/2022      Chronic insomnia Assessment & Plan: Managed with trazodone.  usses ambien sparingly for early [employer] for preventive health examination Assessment & Plan: age appropriate education and counseling updated, referrals for preventative services and immunizations addressed, dietary and smoking counseling addressed, most recent labs reviewed.  I have personally reviewed and have noted:   1) the patient's medical and social history 2) The pt's use of alcohol, tobacco, and illicit drugs 3) The patient's current medications and supplements 4) Functional ability including ADL's, fall risk, home safety risk, hearing and visual impairment 5) Diet and physical activities 6) Evidence for depression or mood disorder 7) The patient's height, weight, and BMI have been recorded in the chart    I have made referrals, and provided counseling and education based on review of the above    Age-related osteoporosis without current pathological fracture Assessment & Plan: She is aware of diagnosis and declines treatment    Other orders -     Doxycycline Hyclate; Take 1 tablet (100 mg total) by mouth 2 (two) times daily.  Dispense: 14 tablet; Refill: 0 -     Azelastine HCl; PLACE 2 SPRAYS INTO BOTH NOSTRILS 2 (TWO) TIMES DAILY. USE IN EACH NOSTRIL AS DIRECTED  Dispense: 30 mL; Refill: 11 -     Triamcinolone Acetonide; Apply 1 Application  topically 2 (two) times daily.  Dispense: 30 g; Refill: 0    No follow-ups on file.  Sherlene Shams, MD

## 2022-05-23 NOTE — Assessment & Plan Note (Signed)
She is aware of diagnosis and declines treatment

## 2022-05-23 NOTE — Assessment & Plan Note (Signed)
Managed with trazodone.  usses ambien sparingly for early wakeups  

## 2022-05-23 NOTE — Assessment & Plan Note (Signed)
Suggested on chest film from 2020.  She is tolerating high potency statin .   Lab Results  Component Value Date   CHOL 147 05/12/2022   HDL 55.10 05/12/2022   LDLCALC 76 05/12/2022   LDLDIRECT 77.0 05/12/2022   TRIG 80.0 05/12/2022   CHOLHDL 3 05/12/2022

## 2022-05-23 NOTE — Assessment & Plan Note (Signed)

## 2022-06-03 ENCOUNTER — Encounter: Payer: Self-pay | Admitting: Nurse Practitioner

## 2022-06-03 ENCOUNTER — Ambulatory Visit (INDEPENDENT_AMBULATORY_CARE_PROVIDER_SITE_OTHER): Payer: Medicare HMO | Admitting: Nurse Practitioner

## 2022-06-03 VITALS — BP 130/78 | HR 64 | Temp 97.8°F | Ht 65.0 in | Wt 147.8 lb

## 2022-06-03 DIAGNOSIS — S91209A Unspecified open wound of unspecified toe(s) with damage to nail, initial encounter: Secondary | ICD-10-CM | POA: Diagnosis not present

## 2022-06-03 MED ORDER — MUPIROCIN 2 % EX OINT: 1.0000 | TOPICAL_OINTMENT | Freq: Two times a day (BID) | CUTANEOUS | 0 refills | Status: DC

## 2022-06-13 ENCOUNTER — Other Ambulatory Visit: Payer: Self-pay | Admitting: Internal Medicine

## 2022-06-16 DIAGNOSIS — S91209A Unspecified open wound of unspecified toe(s) with damage to nail, initial encounter: Secondary | ICD-10-CM | POA: Insufficient documentation

## 2022-06-16 NOTE — Progress Notes (Signed)
Established Patient Office Visit  Subjective:  Patient ID: Amber Sullivan, female    DOB: 12-14-1955  Age: 67 y.o. MRN: [id number]  CC:  Chief Complaint  Patient presents with   Acute Visit    Right big toenail lifting off    HPI  Amber Sullivan presents for right big toenail lifting off from the nailbed.  Patient states that it does not hurt.  She does not remember any injury to the toe. Denies any discharges or pain.  HPI   Past Medical History:  Diagnosis Date   Cancer of skin, squamous cell 05/10/2021   Fracture of lateral malleolus 2009   Fracture of lateral malleolus 01/21/2007   Hyperlipidemia    Hypertension 2003   Positive colorectal cancer screening using Cologuard test 04/20/2016   Positive colorectal cancer screening using DNA-based stool test 04/28/2016   Positive cologuard.  June 2018.  Hyperplastic polyps, July 2018 colonosocpy   10 yr follow up ([facility])    Tendonitis of elbow, right 07/06/2019    Past Surgical History:  Procedure Laterality Date   CHOLECYSTECTOMY  2005   COLONOSCOPY WITH PROPOFOL N/A 08/13/2016   Procedure: COLONOSCOPY WITH PROPOFOL;  Surgeon: Earline Mayotte, MD;  Location: [facility] ENDOSCOPY;  Service: Endoscopy;  Laterality: N/A;   MOHS SURGERY  2007   squamou cell carcinoma rt nasolabial fold   SQUAMOUS CELL CARCINOMA EXCISION  2007   mole on face    Family History  Problem Relation Age of Onset   Hypertension Mother    Diabetes Mother        type 2   Stroke Mother 70       massive, with aphasia and paraplegia   Hypertension Father    Hyperlipidemia Father    Heart attack Father        vs PE during hospitalization for chest pain    Heart disease Father    Heart attack Maternal Grandfather    Heart disease Paternal Grandmother        CHF   Heart disease Paternal Grandfather    Heart attack Paternal Grandfather    Heart attack Brother 58   Breast cancer Neg Hx     Social History   Socioeconomic History    Marital status: Married    Spouse name: Not on file   Number of children: Not on file   Years of education: Not on file   Highest education level: Not on file  Occupational History   Not on file  Tobacco Use   Smoking status: Never   Smokeless tobacco: Never  Vaping Use   Vaping Use: Never used  Substance and Sexual Activity   Alcohol use: Not Currently    Comment: not much   Drug use: No   Sexual activity: Not on file  Other Topics Concern   Not on file  Social History Narrative   Not on file   Social Determinants of Health   Financial Resource Strain: Not on file  Food Insecurity: Not on file  Transportation Needs: Not on file  Physical Activity: Not on file  Stress: Not on file  Social Connections: Not on file  Intimate Partner Violence: Not on file     Outpatient Medications Prior to Visit  Medication Sig Dispense Refill   Azelastine HCl 137 MCG/SPRAY SOLN PLACE 2 SPRAYS INTO BOTH NOSTRILS 2 (TWO) TIMES DAILY. USE IN EACH NOSTRIL AS DIRECTED 30 mL 11   Calcium Carbonate-Vit D-Min (CALTRATE 600+D PLUS MINERALS) 600-800  MG-UNIT TABS Take 1 tablet by mouth daily. Taking 1 tablet every other day     cyanocobalamin (VITAMIN B12) 1000 MCG tablet Take 1,000 mcg by mouth daily.     doxycycline (VIBRA-TABS) 100 MG tablet Take 1 tablet (100 mg total) by mouth 2 (two) times daily. 14 tablet 0   Doxycycline Hyclate 50 MG TABS Take by mouth as needed.     fluticasone (FLONASE) 50 MCG/ACT nasal spray Place 2 sprays into both nostrils daily. 48 g 1   levocetirizine (XYZAL) 5 MG tablet Take 1 tablet (5 mg total) by mouth every evening. 90 tablet 3   losartan (COZAAR) 100 MG tablet Take 1 tablet (100 mg total) by mouth daily. 90 tablet 3   melatonin 5 MG TABS Take 5 mg by mouth.     rosuvastatin (CRESTOR) 10 MG tablet TAKE 1 TABLET BY MOUTH  DAILY 90 tablet 3   traZODone (DESYREL) 50 MG tablet TAKE 1/2 TO 1 TABLET BY  MOUTH AT BEDTIME AS NEEDED  FOR SLEEP 90 tablet 3   triamcinolone  (KENALOG) 0.025 % ointment Apply 1 Application topically 2 (two) times daily. 30 g 0   zolpidem (AMBIEN) 5 MG tablet TAKE 1 TABLET BY MOUTH EVERY DAY AT BEDTIME AS NEEDED FOR SLEEP 30 tablet 5   No facility-administered medications prior to visit.    Allergies  Allergen Reactions   Ace Inhibitors Cough    ROS Review of Systems  Constitutional: Negative.   HENT: Negative.    Respiratory: Negative.    Cardiovascular: Negative.   Musculoskeletal:        Right big toenail lifting off  Psychiatric/Behavioral: Negative.        Objective:    Physical Exam Constitutional:      Appearance: Normal appearance. She is obese.  HENT:     Head: Normocephalic.  Cardiovascular:     Rate and Rhythm: Normal rate and regular rhythm.     Pulses: Normal pulses.     Heart sounds: Normal heart sounds.  Pulmonary:     Effort: Pulmonary effort is normal. No respiratory distress.     Breath sounds: Normal breath sounds. No rhonchi.  Musculoskeletal:     Cervical back: Neck supple. No tenderness.     Comments: Right toenail split, none tender  Skin:    General: Skin is warm.     Capillary Refill: Capillary refill takes less than 2 seconds.  Neurological:     General: No focal deficit present.     Mental Status: She is alert and oriented to person, place, and time. Mental status is at baseline.  Psychiatric:        Mood and Affect: Mood normal.        Behavior: Behavior normal.        Thought Content: Thought content normal.        Judgment: Judgment normal.     BP 130/78   Pulse 64   Temp 97.8 F (36.6 C)   Ht 5\' 5"  (1.651 m)   Wt 147 lb 12.8 oz (67 kg)   SpO2 97%   BMI 24.60 kg/m  Wt Readings from Last 3 Encounters:  06/03/22 147 lb 12.8 oz (67 kg)  05/22/22 144 lb (65.3 kg)  11/11/21 144 lb (65.3 kg)     Health Maintenance  Topic Date Due   COVID-19 Vaccine (7 - 2023-24 season) 12/28/2021   Medicare Annual Wellness (AWV)  06/14/2022   INFLUENZA VACCINE  08/21/2022    MAMMOGRAM  11/27/2022   Pneumonia Vaccine 38+ Years old (3 of 3 - PPSV23 or PCV20) 11/03/2024   Colonoscopy  08/14/2026   DTaP/Tdap/Td (3 - Td or Tdap) 11/02/2028   DEXA SCAN  Completed   Hepatitis C Screening  Completed   Zoster Vaccines- Shingrix  Completed   HPV VACCINES  Aged Out    There are no preventive care reminders to display for this patient.  Lab Results  Component Value Date   TSH 1.90 11/04/2021   Lab Results  Component Value Date   WBC 4.2 11/04/2021   HGB 12.8 11/04/2021   HCT 38.7 11/04/2021   MCV 94.2 11/04/2021   PLT 197.0 11/04/2021   Lab Results  Component Value Date   NA 139 05/12/2022   K 4.0 05/12/2022   CO2 25 05/12/2022   GLUCOSE 92 05/12/2022   BUN 16 05/12/2022   CREATININE 0.80 05/12/2022   BILITOT 0.7 05/12/2022   ALKPHOS 64 05/12/2022   AST 18 05/12/2022   ALT 16 05/12/2022   PROT 6.4 05/12/2022   ALBUMIN 3.9 05/12/2022   CALCIUM 8.8 05/12/2022   ANIONGAP 10 12/11/2019   GFR 75.62 05/12/2022   Lab Results  Component Value Date   CHOL 147 05/12/2022   Lab Results  Component Value Date   HDL 55.10 05/12/2022   Lab Results  Component Value Date   LDLCALC 76 05/12/2022   Lab Results  Component Value Date   TRIG 80.0 05/12/2022   Lab Results  Component Value Date   CHOLHDL 3 05/12/2022   Lab Results  Component Value Date   HGBA1C 5.9 05/12/2022      Assessment & Plan:  Avulsion of toenail, initial encounter Assessment & Plan: Advised him to use his bandage to cover the nail. Apply Bactroban ointment. Patient refused to clip the nail at present. If symptoms not improving call the office for further evaluation.   Other orders -     Mupirocin; Apply 1 Application topically 2 (two) times daily.  Dispense: 22 g; Refill: 0    Follow-up: Return if symptoms worsen or fail to improve.   Kara Dies, NP

## 2022-06-16 NOTE — Assessment & Plan Note (Signed)
Advised him to use his bandage to cover the nail. Apply Bactroban ointment. Patient refused to clip the nail at present. If symptoms not improving call the office for further evaluation.

## 2022-06-20 ENCOUNTER — Telehealth: Payer: Self-pay | Admitting: Pulmonary Disease

## 2022-06-20 NOTE — Telephone Encounter (Signed)
Pt called in bc she doesn't no longer feel as if she needs a pulmonologist, expired her recall.

## 2022-07-16 ENCOUNTER — Ambulatory Visit (INDEPENDENT_AMBULATORY_CARE_PROVIDER_SITE_OTHER): Payer: Medicare HMO

## 2022-07-16 VITALS — Ht 65.0 in | Wt 147.0 lb

## 2022-07-16 DIAGNOSIS — Z0000 Encounter for general adult medical examination without abnormal findings: Secondary | ICD-10-CM | POA: Diagnosis not present

## 2022-07-16 NOTE — Progress Notes (Cosign Needed)
Subjective:   Amber Sullivan is a 67 y.o. female who presents for Medicare Annual (Subsequent) preventive examination.  Visit Complete: Virtual  I connected with  Amber Sullivan on 07/16/22 by a audio enabled telemedicine application and verified that I am speaking with the correct person using two identifiers.  Patient Location: Home  Provider Location: Office/Clinic  I discussed the limitations of evaluation and management by telemedicine. The patient expressed understanding and agreed to proceed.   Review of Systems     Cardiac Risk Factors include: advanced age (>38men, >65 women);hypertension;dyslipidemia     Objective:    Today's Vitals   07/16/22 1413  Weight: 147 lb (66.7 kg)  Height: 5\' 5"  (1.651 m)   Body mass index is 24.46 kg/m.     07/16/2022    2:49 PM 06/29/2021    9:31 AM 06/12/2020    3:33 PM 12/11/2019    8:55 AM 11/26/2019    9:50 AM 08/13/2016    6:56 AM 05/19/2015    8:28 AM  Advanced Directives  Does Patient Have a Medical Advance Directive? No No No No No No No  Would patient like information on creating a medical advance directive? No - Patient declined No - Patient declined No - Patient declined    No - patient declined information    Current Medications (verified) Outpatient Encounter Medications as of 07/16/2022  Medication Sig   Azelastine HCl 137 MCG/SPRAY SOLN PLACE 2 SPRAYS INTO BOTH NOSTRILS 2 (TWO) TIMES DAILY. USE IN EACH NOSTRIL AS DIRECTED   Calcium Carbonate-Vit D-Min (CALTRATE 600+D PLUS MINERALS) 600-800 MG-UNIT TABS Take 1 tablet by mouth daily. Taking 1 tablet every other day   cyanocobalamin (VITAMIN B12) 1000 MCG tablet Take 1,000 mcg by mouth daily.   doxycycline (VIBRA-TABS) 100 MG tablet Take 1 tablet (100 mg total) by mouth 2 (two) times daily.   Doxycycline Hyclate 50 MG TABS Take by mouth as needed.   fluticasone (FLONASE) 50 MCG/ACT nasal spray Place 2 sprays into both nostrils daily.   levocetirizine (XYZAL)  5 MG tablet Take 1 tablet (5 mg total) by mouth every evening.   losartan (COZAAR) 100 MG tablet Take 1 tablet (100 mg total) by mouth daily.   melatonin 5 MG TABS Take 5 mg by mouth.   mupirocin ointment (BACTROBAN) 2 % Apply 1 Application topically 2 (two) times daily.   rosuvastatin (CRESTOR) 10 MG tablet TAKE 1 TABLET BY MOUTH  DAILY   traZODone (DESYREL) 50 MG tablet TAKE 1/2 TO 1 TABLET BY  MOUTH AT BEDTIME AS NEEDED  FOR SLEEP   triamcinolone (KENALOG) 0.025 % ointment Apply 1 Application topically 2 (two) times daily.   zolpidem (AMBIEN) 5 MG tablet TAKE 1 TABLET BY MOUTH AT BEDTIME AS NEEDED FOR SLEEP   No facility-administered encounter medications on file as of 07/16/2022.    Allergies (verified) Ace inhibitors   History: Past Medical History:  Diagnosis Date   Cancer of skin, squamous cell 05/10/2021   Fracture of lateral malleolus 2009   Fracture of lateral malleolus 01/21/2007   Hyperlipidemia    Hypertension 2003   Positive colorectal cancer screening using Cologuard test 04/20/2016   Positive colorectal cancer screening using DNA-based stool test 04/28/2016   Positive cologuard.  June 2018.  Hyperplastic polyps, July 2018 colonosocpy   10 yr follow up ([facility])    Tendonitis of elbow, right 07/06/2019   Past Surgical History:  Procedure Laterality Date   CHOLECYSTECTOMY  2005  COLONOSCOPY WITH PROPOFOL N/A 08/13/2016   Procedure: COLONOSCOPY WITH PROPOFOL;  Surgeon: Earline Mayotte, MD;  Location: [facility] ENDOSCOPY;  Service: Endoscopy;  Laterality: N/A;   MOHS SURGERY  2007   squamou cell carcinoma rt nasolabial fold   SQUAMOUS CELL CARCINOMA EXCISION  2007   mole on face   Family History  Problem Relation Age of Onset   Hypertension Mother    Diabetes Mother        type 2   Stroke Mother 48       massive, with aphasia and paraplegia   Hypertension Father    Hyperlipidemia Father    Heart attack Father        vs PE during hospitalization for chest pain     Heart disease Father    Heart attack Maternal Grandfather    Heart disease Paternal Grandmother        CHF   Heart disease Paternal Grandfather    Heart attack Paternal Grandfather    Heart attack Brother 47   Breast cancer Neg Hx    Social History   Socioeconomic History   Marital status: Married    Spouse name: Not on file   Number of children: 0   Years of education: Not on file   Highest education level: Not on file  Occupational History   Not on file  Tobacco Use   Smoking status: Never   Smokeless tobacco: Never  Vaping Use   Vaping Use: Never used  Substance and Sexual Activity   Alcohol use: Yes    Alcohol/week: 1.0 standard drink of alcohol    Types: 1 Glasses of wine per week    Comment: everyother weekend, occas   Drug use: No   Sexual activity: Not on file  Other Topics Concern   Not on file  Social History Narrative   R handed   1.5 cups of coffee a day   Social Determinants of Health   Financial Resource Strain: Low Risk  (07/16/2022)   Overall Financial Resource Strain (CARDIA)    Difficulty of Paying Living Expenses: Not hard at all  Food Insecurity: No Food Insecurity (07/16/2022)   Hunger Vital Sign    Worried About Running Out of Food in the Last Year: Never true    Ran Out of Food in the Last Year: Never true  Transportation Needs: No Transportation Needs (07/16/2022)   PRAPARE - Administrator, Civil Service (Medical): No    Lack of Transportation (Non-Medical): No  Physical Activity: Sufficiently Active (07/16/2022)   Exercise Vital Sign    Days of Exercise per Week: 6 days    Minutes of Exercise per Session: 60 min  Stress: No Stress Concern Present (07/16/2022)   [employer] of Occupational Health - Occupational Stress Questionnaire    Feeling of Stress : Not at all  Social Connections: Moderately Integrated (07/16/2022)   Social Connection and Isolation Panel [NHANES]    Frequency of Communication with Friends and Family:  More than three times a week    Frequency of Social Gatherings with Friends and Family: More than three times a week    Attends Religious Services: Never    Database administrator or Organizations: Yes    Attends Engineer, structural: More than 4 times per year    Marital Status: Married    Tobacco Counseling Counseling given: Not Answered   Clinical Intake:  Pre-visit preparation completed: Yes  Pain : No/denies pain  BMI - recorded: 24.46 Nutritional Status: BMI of 19-24  Normal Nutritional Risks: None Diabetes: No  How often do you need to have someone help you when you read instructions, pamphlets, or other written materials from your doctor or pharmacy?: 1 - Never What is the last grade level you completed in school?: couple years in college  Interpreter Needed?: No  Information entered by :: Rondalyn Belford   Activities of Daily Living    07/16/2022    2:41 PM  In your present state of health, do you have any difficulty performing the following activities:  Hearing? 0  Vision? 0  Difficulty concentrating or making decisions? 0  Walking or climbing stairs? 0  Dressing or bathing? 0  Doing errands, shopping? 0  Preparing Food and eating ? N  Using the Toilet? N  In the past six months, have you accidently leaked urine? N  Do you have problems with loss of bowel control? N  Managing your Medications? N  Managing your Finances? N  Housekeeping or managing your Housekeeping? N    Patient Care Team: Sherlene Shams, MD as PCP - General (Internal Medicine) Sherlene Shams, MD (Internal Medicine) Lemar Livings Merrily Pew, MD (General Surgery)  Indicate any recent Medical Services you may have received from other than [facility] providers in the past year (date may be approximate).     Assessment:   This is a routine wellness examination for [place].  Hearing/Vision screen Hearing Screening - Comments:: No concerns  Dietary issues and exercise activities  discussed:     Goals Addressed             This Visit's Progress    Maintain Healthy Lifestyle   On track    Stay active  Healthy diet        Depression Screen    07/16/2022    2:53 PM 06/03/2022    2:00 PM 05/22/2022    9:24 AM 11/11/2021    8:44 AM 06/13/2021    1:16 PM 02/21/2021    8:18 AM 11/30/2020    9:07 AM  PHQ 2/9 Scores  PHQ - 2 Score 0 0 0 0 0 0 0  PHQ- 9 Score 0 0  0   0    Fall Risk    07/16/2022    2:43 PM 06/03/2022    2:00 PM 05/22/2022    9:24 AM 11/11/2021    8:44 AM 06/13/2021    1:15 PM  Fall Risk   Falls in the past year? 0 0 0 0 0  Number falls in past yr: 0 0 0  0  Injury with Fall? 0 0 0    Risk for fall due to : No Fall Risks No Fall Risks No Fall Risks No Fall Risks No Fall Risks  Follow up Falls prevention discussed Falls evaluation completed Falls evaluation completed Falls evaluation completed Falls evaluation completed    MEDICARE RISK AT HOME:  Medicare Risk at Home - 07/16/22 1450     Any stairs in or around the home? No    If so, are there any without handrails? No    Home free of loose throw rugs in walkways, pet beds, electrical cords, etc? Yes    Adequate lighting in your home to reduce risk of falls? Yes    Life alert? No    Use of a cane, walker or w/c? No    Grab bars in the bathroom? Yes    Shower chair or bench in  shower? No    Elevated toilet seat or a handicapped toilet? Yes             TIMED UP AND GO:  Was the test performed?  No    Cognitive Function:        07/16/2022    2:51 PM  6CIT Screen  What Year? 0 points  What month? 0 points  What time? 0 points  Count back from 20 0 points  Months in reverse 0 points  Repeat phrase 2 points  Total Score 2 points    Immunizations Immunization History  Administered Date(s) Administered   COVID-19, mRNA, vaccine(Comirnaty)12 years and older 11/02/2021   Fluad Quad(high Dose 65+) 11/03/2018   Influenza,inj,Quad PF,6+ Mos 10/12/2012, 10/25/2014    Influenza-Unspecified 11/16/2019, 10/31/2020   Moderna Covid-19 Vaccine Bivalent Booster 24yrs & up 09/25/2020   PFIZER Comirnaty(Gray Top)Covid-19 Tri-Sucrose Vaccine 05/24/2020   PFIZER(Purple Top)SARS-COV-2 Vaccination 02/14/2019, 03/09/2019, 10/31/2019   Pneumococcal Conjugate-13 11/04/2019   Pneumococcal Polysaccharide-23 03/29/2004   Tdap 10/14/2007, 11/03/2018   Zoster Recombinat (Shingrix) 03/09/2020, 06/02/2020    TDAP status: Up to date  Flu Vaccine status: Declined, Education has been provided regarding the importance of this vaccine but patient still declined. Advised may receive this vaccine at local pharmacy or Health Dept. Aware to provide a copy of the vaccination record if obtained from local pharmacy or Health Dept. Verbalized acceptance and understanding.  Pneumococcal vaccine status: Up to date  Covid-19 vaccine status: Completed vaccines  Qualifies for Shingles Vaccine? Yes   Zostavax completed Yes   Shingrix Completed?: Yes  Screening Tests Health Maintenance  Topic Date Due   COVID-19 Vaccine (7 - 2023-24 season) 12/28/2021   INFLUENZA VACCINE  08/21/2022   MAMMOGRAM  11/27/2022   Medicare Annual Wellness (AWV)  07/16/2023   Pneumonia Vaccine 50+ Years old (3 of 3 - PPSV23 or PCV20) 11/03/2024   Colonoscopy  08/14/2026   DTaP/Tdap/Td (3 - Td or Tdap) 11/02/2028   DEXA SCAN  Completed   Hepatitis C Screening  Completed   Zoster Vaccines- Shingrix  Completed   HPV VACCINES  Aged Out    Health Maintenance  Health Maintenance Due  Topic Date Due   COVID-19 Vaccine (7 - 2023-24 season) 12/28/2021    Colorectal cancer screening: Type of screening: Colonoscopy. Completed 08/13/2016. Repeat every 10 years  Mammogram status: Completed 11/26/2021. Repeat every year  Bone Density status: Completed 07/04/2014. Results reflect: Bone density results: OSTEOPOROSIS. Repeat every 2 years.   Lung Cancer Screening Referral: n/a  Additional  Screening:  Hepatitis C Screening: does qualify; Completed 11/19/2015  Vision Screening: Recommended annual ophthalmology exams for early detection of glaucoma and other disorders of the eye. Is the patient up to date with their annual eye exam?  Yes  Who is the provider or what is the name of the office in which the patient attends annual eye exams? [facility] If pt is not established with a provider, would they like to be referred to a provider to establish care? No .   Dental Screening: Recommended annual dental exams for proper oral hygiene  Community Resource Referral / Chronic Care Management: CRR required this visit?  No   CCM required this visit?  No     Plan:     I have personally reviewed and noted the following in the patient's chart:   Medical and social history Use of alcohol, tobacco or illicit drugs  Current medications and supplements including opioid prescriptions. Patient is  not currently taking opioid prescriptions. Functional ability and status Nutritional status Physical activity Advanced directives List of other physicians Hospitalizations, surgeries, and ER visits in previous 12 months Vitals Screenings to include cognitive, depression, and falls Referrals and appointments  In addition, I have reviewed and discussed with patient certain preventive protocols, quality metrics, and best practice recommendations. A written personalized care plan for preventive services as well as general preventive health recommendations were provided to patient.     Zi Sek  Arna Medici, CMA   07/16/2022   After Visit Summary: (MyChart) Due to this being a telephonic visit, the after visit summary with patients personalized plan was offered to patient via MyChart   Nurse Notes:      I have reviewed the above information and agree with above.   Duncan Dull, MD

## 2022-07-16 NOTE — Patient Instructions (Signed)
Amber Sullivan , Thank you for taking time to come for your Medicare Wellness Visit. I appreciate your ongoing commitment to your health goals. Please review the following plan we discussed and let me know if I can assist you in the future.   These are the goals we discussed:  Goals      Maintain Healthy Lifestyle     Stay active  Healthy diet          This is a list of the screening recommended for you and due dates:  Health Maintenance  Topic Date Due   COVID-19 Vaccine (7 - 2023-24 season) 12/28/2021   Flu Shot  08/21/2022   Mammogram  11/27/2022   Medicare Annual Wellness Visit  07/16/2023   Pneumonia Vaccine (3 of 3 - PPSV23 or PCV20) 11/03/2024   Colon Cancer Screening  08/14/2026   DTaP/Tdap/Td vaccine (3 - Td or Tdap) 11/02/2028   DEXA scan (bone density measurement)  Completed   Hepatitis C Screening  Completed   Zoster (Shingles) Vaccine  Completed   HPV Vaccine  Aged Out     Amber Sullivan , Thank you for taking time to come for your Medicare Wellness Visit. I appreciate your ongoing commitment to your health goals. Please review the following plan we discussed and let me know if I can assist you in the future.   These are the goals we discussed:  Goals      Maintain Healthy Lifestyle     Stay active  Healthy diet          This is a list of the screening recommended for you and due dates:  Health Maintenance  Topic Date Due   COVID-19 Vaccine (7 - 2023-24 season) 12/28/2021   Flu Shot  08/21/2022   Mammogram  11/27/2022   Medicare Annual Wellness Visit  07/16/2023   Pneumonia Vaccine (3 of 3 - PPSV23 or PCV20) 11/03/2024   Colon Cancer Screening  08/14/2026   DTaP/Tdap/Td vaccine (3 - Td or Tdap) 11/02/2028   DEXA scan (bone density measurement)  Completed   Hepatitis C Screening  Completed   Zoster (Shingles) Vaccine  Completed   HPV Vaccine  Aged Out    Advanced directives: none  Conditions/risks identified: none  Next appointment: Follow up  in one year for your annual wellness visit    Preventive Care 65 Years and Older, Female Preventive care refers to lifestyle choices and visits with your health care provider that can promote health and wellness. What does preventive care include? A yearly physical exam. This is also called an annual well check. Dental exams once or twice a year. Routine eye exams. Ask your health care provider how often you should have your eyes checked. Personal lifestyle choices, including: Daily care of your teeth and gums. Regular physical activity. Eating a healthy diet. Avoiding tobacco and drug use. Limiting alcohol use. Practicing safe sex. Taking low-dose aspirin every day. Taking vitamin and mineral supplements as recommended by your health care provider. What happens during an annual well check? The services and screenings done by your health care provider during your annual well check will depend on your age, overall health, lifestyle risk factors, and family history of disease. Counseling  Your health care provider may ask you questions about your: Alcohol use. Tobacco use. Drug use. Emotional well-being. Home and relationship well-being. Sexual activity. Eating habits. History of falls. Memory and ability to understand (cognition). Work and work Astronomer. Reproductive health. Screening  You may have  the following tests or measurements: Height, weight, and BMI. Blood pressure. Lipid and cholesterol levels. These may be checked every 5 years, or more frequently if you are over 38 years old. Skin check. Lung cancer screening. You may have this screening every year starting at age 15 if you have a 30-pack-year history of smoking and currently smoke or have quit within the past 15 years. Fecal occult blood test (FOBT) of the stool. You may have this test every year starting at age 62. Flexible sigmoidoscopy or colonoscopy. You may have a sigmoidoscopy every 5 years or a  colonoscopy every 10 years starting at age 64. Hepatitis C blood test. Hepatitis B blood test. Sexually transmitted disease (STD) testing. Diabetes screening. This is done by checking your blood sugar (glucose) after you have not eaten for a while (fasting). You may have this done every 1-3 years. Bone density scan. This is done to screen for osteoporosis. You may have this done starting at age 20. Mammogram. This may be done every 1-2 years. Talk to your health care provider about how often you should have regular mammograms. Talk with your health care provider about your test results, treatment options, and if necessary, the need for more tests. Vaccines  Your health care provider may recommend certain vaccines, such as: Influenza vaccine. This is recommended every year. Tetanus, diphtheria, and acellular pertussis (Tdap, Td) vaccine. You may need a Td booster every 10 years. Zoster vaccine. You may need this after age 57. Pneumococcal 13-valent conjugate (PCV13) vaccine. One dose is recommended after age 34. Pneumococcal polysaccharide (PPSV23) vaccine. One dose is recommended after age 47. Talk to your health care provider about which screenings and vaccines you need and how often you need them. This information is not intended to replace advice given to you by your health care provider. Make sure you discuss any questions you have with your health care provider. Document Released: 02/02/2015 Document Revised: 09/26/2015 Document Reviewed: 11/07/2014 Elsevier Interactive Patient Education  2017 [employer].  Fall Prevention in the Home Falls can cause injuries. They can happen to people of all ages. There are many things you can do to make your home safe and to help prevent falls. What can I do on the outside of my home? Regularly fix the edges of walkways and driveways and fix any cracks. Remove anything that might make you trip as you walk through a door, such as a raised step or  threshold. Trim any bushes or trees on the path to your home. Use bright outdoor lighting. Clear any walking paths of anything that might make someone trip, such as rocks or tools. Regularly check to see if handrails are loose or broken. Make sure that both sides of any steps have handrails. Any raised decks and porches should have guardrails on the edges. Have any leaves, snow, or ice cleared regularly. Use sand or salt on walking paths during winter. Clean up any spills in your garage right away. This includes oil or grease spills. What can I do in the bathroom? Use night lights. Install grab bars by the toilet and in the tub and shower. Do not use towel bars as grab bars. Use non-skid mats or decals in the tub or shower. If you need to sit down in the shower, use a plastic, non-slip stool. Keep the floor dry. Clean up any water that spills on the floor as soon as it happens. Remove soap buildup in the tub or shower regularly. Attach bath mats  securely with double-sided non-slip rug tape. Do not have throw rugs and other things on the floor that can make you trip. What can I do in the bedroom? Use night lights. Make sure that you have a light by your bed that is easy to reach. Do not use any sheets or blankets that are too big for your bed. They should not hang down onto the floor. Have a firm chair that has side arms. You can use this for support while you get dressed. Do not have throw rugs and other things on the floor that can make you trip. What can I do in the kitchen? Clean up any spills right away. Avoid walking on wet floors. Keep items that you use a lot in easy-to-reach places. If you need to reach something above you, use a strong step stool that has a grab bar. Keep electrical cords out of the way. Do not use floor polish or wax that makes floors slippery. If you must use wax, use non-skid floor wax. Do not have throw rugs and other things on the floor that can make you  trip. What can I do with my stairs? Do not leave any items on the stairs. Make sure that there are handrails on both sides of the stairs and use them. Fix handrails that are broken or loose. Make sure that handrails are as long as the stairways. Check any carpeting to make sure that it is firmly attached to the stairs. Fix any carpet that is loose or worn. Avoid having throw rugs at the top or bottom of the stairs. If you do have throw rugs, attach them to the floor with carpet tape. Make sure that you have a light switch at the top of the stairs and the bottom of the stairs. If you do not have them, ask someone to add them for you. What else can I do to help prevent falls? Wear shoes that: Do not have high heels. Have rubber bottoms. Are comfortable and fit you well. Are closed at the toe. Do not wear sandals. If you use a stepladder: Make sure that it is fully opened. Do not climb a closed stepladder. Make sure that both sides of the stepladder are locked into place. Ask someone to hold it for you, if possible. Clearly mark and make sure that you can see: Any grab bars or handrails. First and last steps. Where the edge of each step is. Use tools that help you move around (mobility aids) if they are needed. These include: Canes. Walkers. Scooters. Crutches. Turn on the lights when you go into a dark area. Replace any light bulbs as soon as they burn out. Set up your furniture so you have a clear path. Avoid moving your furniture around. If any of your floors are uneven, fix them. If there are any pets around you, be aware of where they are. Review your medicines with your doctor. Some medicines can make you feel dizzy. This can increase your chance of falling. Ask your doctor what other things that you can do to help prevent falls. This information is not intended to replace advice given to you by your health care provider. Make sure you discuss any questions you have with your  health care provider. Document Released: 11/02/2008 Document Revised: 06/14/2015 Document Reviewed: 02/10/2014 Elsevier Interactive Patient Education  2017 [employer].

## 2022-07-16 NOTE — Progress Notes (Deleted)
Subjective:   Amber Sullivan is a 67 y.o. female who presents for Medicare Annual (Subsequent) preventive examination.  Visit Complete: Virtual  I connected with  Amber Sullivan on 07/16/22 by a audio enabled telemedicine application and verified that I am speaking with the correct person using two identifiers.  Patient Location: Home  Provider Location: Office/Clinic  I discussed the limitations of evaluation and management by telemedicine. The patient expressed understanding and agreed to proceed.  Review of Systems           Objective:    Today's Vitals   07/16/22 1413  Weight: 147 lb (66.7 kg)  Height: 5\' 5"  (1.651 m)   Body mass index is 24.46 kg/m.     06/29/2021    9:31 AM 06/12/2020    3:33 PM 12/11/2019    8:55 AM 11/26/2019    9:50 AM 08/13/2016    6:56 AM 05/19/2015    8:28 AM 11/21/2014    2:07 PM  Advanced Directives  Does Patient Have a Medical Advance Directive? No No No No No No No  Does patient want to make changes to medical advance directive?       No - Patient declined  Would patient like information on creating a medical advance directive? No - Patient declined No - Patient declined    No - patient declined information     Current Medications (verified) Outpatient Encounter Medications as of 07/16/2022  Medication Sig   Azelastine HCl 137 MCG/SPRAY SOLN PLACE 2 SPRAYS INTO BOTH NOSTRILS 2 (TWO) TIMES DAILY. USE IN EACH NOSTRIL AS DIRECTED   Calcium Carbonate-Vit D-Min (CALTRATE 600+D PLUS MINERALS) 600-800 MG-UNIT TABS Take 1 tablet by mouth daily. Taking 1 tablet every other day   cyanocobalamin (VITAMIN B12) 1000 MCG tablet Take 1,000 mcg by mouth daily.   doxycycline (VIBRA-TABS) 100 MG tablet Take 1 tablet (100 mg total) by mouth 2 (two) times daily.   Doxycycline Hyclate 50 MG TABS Take by mouth as needed.   fluticasone (FLONASE) 50 MCG/ACT nasal spray Place 2 sprays into both nostrils daily.   levocetirizine (XYZAL) 5 MG tablet Take  1 tablet (5 mg total) by mouth every evening.   losartan (COZAAR) 100 MG tablet Take 1 tablet (100 mg total) by mouth daily.   melatonin 5 MG TABS Take 5 mg by mouth.   mupirocin ointment (BACTROBAN) 2 % Apply 1 Application topically 2 (two) times daily.   rosuvastatin (CRESTOR) 10 MG tablet TAKE 1 TABLET BY MOUTH  DAILY   traZODone (DESYREL) 50 MG tablet TAKE 1/2 TO 1 TABLET BY  MOUTH AT BEDTIME AS NEEDED  FOR SLEEP   triamcinolone (KENALOG) 0.025 % ointment Apply 1 Application topically 2 (two) times daily.   zolpidem (AMBIEN) 5 MG tablet TAKE 1 TABLET BY MOUTH AT BEDTIME AS NEEDED FOR SLEEP   No facility-administered encounter medications on file as of 07/16/2022.    Allergies (verified) Ace inhibitors   History: Past Medical History:  Diagnosis Date   Cancer of skin, squamous cell 05/10/2021   Fracture of lateral malleolus 2009   Fracture of lateral malleolus 01/21/2007   Hyperlipidemia    Hypertension 2003   Positive colorectal cancer screening using Cologuard test 04/20/2016   Positive colorectal cancer screening using DNA-based stool test 04/28/2016   Positive cologuard.  June 2018.  Hyperplastic polyps, July 2018 colonosocpy   10 yr follow up ([facility])    Tendonitis of elbow, right 07/06/2019   Past Surgical History:  Procedure Laterality Date   CHOLECYSTECTOMY  2005   COLONOSCOPY WITH PROPOFOL N/A 08/13/2016   Procedure: COLONOSCOPY WITH PROPOFOL;  Surgeon: Earline Mayotte, MD;  Location: [facility] ENDOSCOPY;  Service: Endoscopy;  Laterality: N/A;   MOHS SURGERY  2007   squamou cell carcinoma rt nasolabial fold   SQUAMOUS CELL CARCINOMA EXCISION  2007   mole on face   Family History  Problem Relation Age of Onset   Hypertension Mother    Diabetes Mother        type 2   Stroke Mother 64       massive, with aphasia and paraplegia   Hypertension Father    Hyperlipidemia Father    Heart attack Father        vs PE during hospitalization for chest pain    Heart disease  Father    Heart attack Maternal Grandfather    Heart disease Paternal Grandmother        CHF   Heart disease Paternal Grandfather    Heart attack Paternal Grandfather    Heart attack Brother 6   Breast cancer Neg Hx    Social History   Socioeconomic History   Marital status: Married    Spouse name: Not on file   Number of children: Not on file   Years of education: Not on file   Highest education level: Not on file  Occupational History   Not on file  Tobacco Use   Smoking status: Never   Smokeless tobacco: Never  Vaping Use   Vaping Use: Never used  Substance and Sexual Activity   Alcohol use: Not Currently    Comment: not much   Drug use: No   Sexual activity: Not on file  Other Topics Concern   Not on file  Social History Narrative   Not on file   Social Determinants of Health   Financial Resource Strain: Not on file  Food Insecurity: Not on file  Transportation Needs: Not on file  Physical Activity: Not on file  Stress: Not on file  Social Connections: Not on file    Tobacco Counseling Counseling given: Not Answered   Clinical Intake:                        Activities of Daily Living     No data to display           Patient Care Team: Sherlene Shams, MD as PCP - General (Internal Medicine) Sherlene Shams, MD (Internal Medicine) Lemar Livings Merrily Pew, MD (General Surgery)  Indicate any recent Medical Services you may have received from other than [facility] providers in the past year (date may be approximate).     Assessment:   This is a routine wellness examination for [place].  Hearing/Vision screen No results found.  Dietary issues and exercise activities discussed:     Goals Addressed   None   Depression Screen    06/03/2022    2:00 PM 05/22/2022    9:24 AM 11/11/2021    8:44 AM 06/13/2021    1:16 PM 02/21/2021    8:18 AM 11/30/2020    9:07 AM 11/09/2020    9:20 AM  PHQ 2/9 Scores  PHQ - 2 Score 0 0 0 0 0 0 0  PHQ- 9  Score 0  0   0     Fall Risk    06/03/2022    2:00 PM 05/22/2022    9:24 AM 11/11/2021  8:44 AM 06/13/2021    1:15 PM 02/21/2021    8:18 AM  Fall Risk   Falls in the past year? 0 0 0 0 0  Number falls in past yr: 0 0  0   Injury with Fall? 0 0     Risk for fall due to : No Fall Risks No Fall Risks No Fall Risks No Fall Risks No Fall Risks  Follow up Falls evaluation completed Falls evaluation completed Falls evaluation completed Falls evaluation completed Falls evaluation completed    MEDICARE RISK AT HOME:   TIMED UP AND GO:  Was the test performed?  No    Cognitive Function:        Immunizations Immunization History  Administered Date(s) Administered   COVID-19, mRNA, vaccine(Comirnaty)12 years and older 11/02/2021   Fluad Quad(high Dose 65+) 11/03/2018   Influenza,inj,Quad PF,6+ Mos 10/12/2012, 10/25/2014   Influenza-Unspecified 11/16/2019, 10/31/2020   Moderna Covid-19 Vaccine Bivalent Booster 72yrs & up 09/25/2020   PFIZER Comirnaty(Gray Top)Covid-19 Tri-Sucrose Vaccine 05/24/2020   PFIZER(Purple Top)SARS-COV-2 Vaccination 02/14/2019, 03/09/2019, 10/31/2019   Pneumococcal Conjugate-13 11/04/2019   Pneumococcal Polysaccharide-23 03/29/2004   Tdap 10/14/2007, 11/03/2018   Zoster Recombinat (Shingrix) 03/09/2020, 06/02/2020    TDAP status: Up to date  Flu Vaccine status: Declined, Education has been provided regarding the importance of this vaccine but patient still declined. Advised may receive this vaccine at local pharmacy or Health Dept. Aware to provide a copy of the vaccination record if obtained from local pharmacy or Health Dept. Verbalized acceptance and understanding.  Pneumococcal vaccine status: Up to date  Covid-19 vaccine status: Completed vaccines  Qualifies for Shingles Vaccine? Yes   Zostavax completed Yes   Shingrix Completed?: Yes  Screening Tests Health Maintenance  Topic Date Due   COVID-19 Vaccine (7 - 2023-24 season) 12/28/2021    INFLUENZA VACCINE  08/21/2022   MAMMOGRAM  11/27/2022   Medicare Annual Wellness (AWV)  07/16/2023   Pneumonia Vaccine 3+ Years old (3 of 3 - PPSV23 or PCV20) 11/03/2024   Colonoscopy  08/14/2026   DTaP/Tdap/Td (3 - Td or Tdap) 11/02/2028   DEXA SCAN  Completed   Hepatitis C Screening  Completed   Zoster Vaccines- Shingrix  Completed   HPV VACCINES  Aged Out    Health Maintenance  Health Maintenance Due  Topic Date Due   COVID-19 Vaccine (7 - 2023-24 season) 12/28/2021    Colorectal cancer screening: Type of screening: Colonoscopy. Completed 08/13/2016. Repeat every 10 years  Mammogram status: Completed 11/27/2021. Repeat every year  Bone Density status: Completed 07/04/2014. Results reflect: Bone density results: OSTEOPOROSIS. Repeat every 2 years.  Lung Cancer Screening: (Low Dose CT Chest recommended if Age 21-80 years, 20 pack-year currently smoking OR have quit w/in 15years.) does not qualify.   Lung Cancer Screening Referral: n/a  Additional Screening:  Hepatitis C Screening: does qualify; Completed 11/19/2015  Vision Screening: Recommended annual ophthalmology exams for early detection of glaucoma and other disorders of the eye. Is the patient up to date with their annual eye exam?  Yes  Who is the provider or what is the name of the office in which the patient attends annual eye exams? [facility] in May 2024 If pt is not established with a provider, would they like to be referred to a provider to establish care? No .   Dental Screening: Recommended annual dental exams for proper oral hygiene  Community Resource Referral / Chronic Care Management: CRR required this visit?  No   CCM  required this visit?  No     Plan:     I have personally reviewed and noted the following in the patient's chart:   Medical and social history Use of alcohol, tobacco or illicit drugs  Current medications and supplements including opioid prescriptions. Patient is not  currently taking opioid prescriptions. Functional ability and status Nutritional status Physical activity Advanced directives List of other physicians Hospitalizations, surgeries, and ER visits in previous 12 months Vitals Screenings to include cognitive, depression, and falls Referrals and appointments  In addition, I have reviewed and discussed with patient certain preventive protocols, quality metrics, and best practice recommendations. A written personalized care plan for preventive services as well as general preventive health recommendations were provided to patient.     Kynedi Profitt  Arna Medici, CMA   07/16/2022   After Visit Summary: (MyChart) Due to this being a telephonic visit, the after visit summary with patients personalized plan was offered to patient via MyChart   Nurse Notes: ***

## 2022-08-21 ENCOUNTER — Other Ambulatory Visit: Payer: Self-pay | Admitting: Family

## 2022-10-20 ENCOUNTER — Other Ambulatory Visit: Payer: Self-pay | Admitting: Internal Medicine

## 2022-10-20 ENCOUNTER — Telehealth: Payer: Self-pay

## 2022-10-20 DIAGNOSIS — Z79899 Other long term (current) drug therapy: Secondary | ICD-10-CM

## 2022-10-20 DIAGNOSIS — E782 Mixed hyperlipidemia: Secondary | ICD-10-CM

## 2022-10-20 DIAGNOSIS — I10 Essential (primary) hypertension: Secondary | ICD-10-CM

## 2022-10-20 NOTE — Telephone Encounter (Signed)
I have pended labs for pt to have done before her next appt in November. If everything is okay I will schedule pt for a lab appt.

## 2022-10-20 NOTE — Telephone Encounter (Signed)
Thank you Jess.  All good

## 2022-10-20 NOTE — Telephone Encounter (Signed)
Patient received our notification that she needed to reschedule her appointment with Dr. Duncan Dull.  I rescheduled patient for 11/25/2022 at 11am.  Patient states she would like to know if she is supposed to have blood drawn for this appointment.  I do not see orders in our system for labs.  I let patient know that I will send a message to Dr. Darrick Huntsman to find out if we need to schedule a separate lab visit.

## 2022-10-21 NOTE — Telephone Encounter (Signed)
LMTCB. Labs have been ordered so fasting lab appt can be schedule when she calls back.

## 2022-10-22 NOTE — Telephone Encounter (Signed)
Pt called back and scheduled lab appt.  ?

## 2022-11-17 ENCOUNTER — Other Ambulatory Visit: Payer: Self-pay | Admitting: Internal Medicine

## 2022-11-17 ENCOUNTER — Telehealth: Payer: Self-pay | Admitting: Internal Medicine

## 2022-11-17 DIAGNOSIS — Z1231 Encounter for screening mammogram for malignant neoplasm of breast: Secondary | ICD-10-CM

## 2022-11-17 DIAGNOSIS — I10 Essential (primary) hypertension: Secondary | ICD-10-CM

## 2022-11-17 DIAGNOSIS — E782 Mixed hyperlipidemia: Secondary | ICD-10-CM

## 2022-11-17 DIAGNOSIS — Z79899 Other long term (current) drug therapy: Secondary | ICD-10-CM

## 2022-11-17 NOTE — Telephone Encounter (Signed)
Patient need lab orders.

## 2022-11-18 NOTE — Telephone Encounter (Signed)
Labs have been reordered. 

## 2022-11-21 ENCOUNTER — Other Ambulatory Visit (INDEPENDENT_AMBULATORY_CARE_PROVIDER_SITE_OTHER): Payer: Medicare HMO

## 2022-11-21 DIAGNOSIS — Z79899 Other long term (current) drug therapy: Secondary | ICD-10-CM

## 2022-11-21 DIAGNOSIS — I10 Essential (primary) hypertension: Secondary | ICD-10-CM

## 2022-11-21 DIAGNOSIS — E782 Mixed hyperlipidemia: Secondary | ICD-10-CM

## 2022-11-21 LAB — COMPREHENSIVE METABOLIC PANEL
ALT: 15 U/L (ref 0–35)
AST: 14 U/L (ref 0–37)
Albumin: 3.8 g/dL (ref 3.5–5.2)
Alkaline Phosphatase: 63 U/L (ref 39–117)
BUN: 13 mg/dL (ref 6–23)
CO2: 29 meq/L (ref 19–32)
Calcium: 9.1 mg/dL (ref 8.4–10.5)
Chloride: 105 meq/L (ref 96–112)
Creatinine, Ser: 0.88 mg/dL (ref 0.40–1.20)
GFR: 67.2 mL/min (ref >60.00)
Glucose, Bld: 90 mg/dL (ref 70–99)
Potassium: 4.1 meq/L (ref 3.5–5.1)
Sodium: 141 meq/L (ref 135–145)
Total Bilirubin: 0.7 mg/dL (ref 0.2–1.2)
Total Protein: 6.4 g/dL (ref 6.0–8.3)

## 2022-11-21 LAB — TSH: TSH: 2.24 u[IU]/mL (ref 0.35–5.50)

## 2022-11-21 LAB — LDL CHOLESTEROL, DIRECT: Direct LDL: 94 mg/dL

## 2022-11-21 LAB — CBC WITH DIFFERENTIAL/PLATELET
Basophils Absolute: 0 10*3/uL (ref 0.0–0.1)
Basophils Relative: 0.7 % (ref 0.0–3.0)
Eosinophils Absolute: 0.1 10*3/uL (ref 0.0–0.7)
Eosinophils Relative: 1.6 % (ref 0.0–5.0)
HCT: 41.1 % (ref 36.0–46.0)
Hemoglobin: 13.3 g/dL (ref 12.0–15.0)
Lymphocytes Relative: 39.1 % (ref 12.0–46.0)
Lymphs Abs: 2.2 10*3/uL (ref 0.7–4.0)
MCHC: 32.3 g/dL (ref 30.0–36.0)
MCV: 94.5 fL (ref 78.0–100.0)
Monocytes Absolute: 0.5 10*3/uL (ref 0.1–1.0)
Monocytes Relative: 9 % (ref 3.0–12.0)
Neutro Abs: 2.8 10*3/uL (ref 1.4–7.7)
Neutrophils Relative %: 49.6 % (ref 43.0–77.0)
Platelets: 216 10*3/uL (ref 150.0–400.0)
RBC: 4.35 Mil/uL (ref 3.87–5.11)
RDW: 13.5 % (ref 11.5–15.5)
WBC: 5.7 10*3/uL (ref 4.0–10.5)

## 2022-11-21 LAB — LIPID PANEL
Cholesterol: 169 mg/dL (ref 0–200)
HDL: 55.6 mg/dL (ref >39.00)
LDL Cholesterol: 91 mg/dL (ref 0–99)
NonHDL: 113.62
Total CHOL/HDL Ratio: 3
Triglycerides: 111 mg/dL (ref 0.0–149.0)
VLDL: 22.2 mg/dL (ref 0.0–40.0)

## 2022-11-24 ENCOUNTER — Ambulatory Visit: Payer: Medicare HMO | Admitting: Internal Medicine

## 2022-11-25 ENCOUNTER — Ambulatory Visit: Payer: Medicare HMO | Admitting: Internal Medicine

## 2022-11-25 ENCOUNTER — Encounter: Payer: Self-pay | Admitting: Internal Medicine

## 2022-11-25 VITALS — BP 126/70 | HR 72 | Ht 65.0 in | Wt 143.8 lb

## 2022-11-25 DIAGNOSIS — T63421A Toxic effect of venom of ants, accidental (unintentional), initial encounter: Secondary | ICD-10-CM | POA: Insufficient documentation

## 2022-11-25 DIAGNOSIS — F5104 Psychophysiologic insomnia: Secondary | ICD-10-CM

## 2022-11-25 DIAGNOSIS — S91209D Unspecified open wound of unspecified toe(s) with damage to nail, subsequent encounter: Secondary | ICD-10-CM

## 2022-11-25 DIAGNOSIS — E782 Mixed hyperlipidemia: Secondary | ICD-10-CM | POA: Diagnosis not present

## 2022-11-25 HISTORY — DX: Toxic effect of venom of ants, accidental (unintentional), initial encounter: T63.421A

## 2022-11-25 MED ORDER — ZOLPIDEM TARTRATE 5 MG PO TABS: 5.0000 mg | ORAL_TABLET | Freq: Every evening | ORAL | 1 refills | Status: DC | PRN

## 2022-11-25 MED ORDER — ROSUVASTATIN CALCIUM 10 MG PO TABS: 10.0000 mg | ORAL_TABLET | Freq: Every day | ORAL | 1 refills | Status: DC

## 2022-11-25 MED ORDER — TRAZODONE HCL 50 MG PO TABS: 25.0000 mg | ORAL_TABLET | Freq: Every evening | ORAL | 3 refills | Status: DC | PRN

## 2022-11-25 NOTE — Patient Instructions (Addendum)
For the fire ant bites:  they look infected.  Take the doxycycline (WITH FOOD)  for a week,  increase the antihistamine to  2 or 3 times daily for the itching   You might want to try using Relaxium for insomnia  (as seen on TV commercials) . It is available through [employer] and contains all natural supplements:  Melatonin 5 mg  Chamomile 25 mg Passionflower extract 75 mg GABA 100 mg Ashwaganda extract 125 mg Magnesium citrate, glycinate, oxide (100 mg)  L tryptophan 500 mg Valerest (proprietary  ingredient ; probably valeria root extract)     The RSV vaccine is a good idea given Joe's condition

## 2022-11-25 NOTE — Assessment & Plan Note (Signed)
Managed with trazodone and careful adherence to all sleep hygiene advise.  She uses  ambien sparingly for prlonoged  sleep latency

## 2022-11-25 NOTE — Assessment & Plan Note (Signed)
LDL is at goal on rosuvastatin  nd LFTs are normal .  No changes today   Lab Results  Component Value Date   CHOL 169 11/21/2022   HDL 55.60 11/21/2022   LDLCALC 91 11/21/2022   LDLDIRECT 94.0 11/21/2022   TRIG 111.0 11/21/2022   CHOLHDL 3 11/21/2022

## 2022-11-25 NOTE — Assessment & Plan Note (Signed)
She has a dozen pustular bites with surrounding erythema on right knee .  Rec adding doxycycline to antihistamine regimen

## 2022-11-25 NOTE — Progress Notes (Signed)
Subjective:  Patient ID: Amber Sullivan, female    DOB: 18-Sep-1955  Age: 68 y.o. MRN: [id number]  CC: The primary encounter diagnosis was Fire ant bite, accidental or unintentional, initial encounter. Diagnoses of Chronic insomnia, Mixed hyperlipidemia, and Avulsion of toenail, subsequent encounter were also pertinent to this visit.   HPI Amber Sullivan presents for  Chief Complaint  Patient presents with   Medical Management of Chronic Issues   1) HLD:  tolerating Crestor   2) Anxiety with insomnia: has retired,  less stress ,  but still noise sensitive and light sensitive.  using ambien prn and trazodone nightly.  Staying busy gardening,  consulting,  exercising  several days per week.  Still laying pain.   3) HTN:  .Patient is taking her medications as prescribed and notes no adverse effects.  Home BP readings have been done about once per week and are  generally < 130/80 .  She is avoiding added salt in her diet and walking regularly about 3 times per week for exercise  .   4) fire ant bites.  Sustained yesterday during gardening.  Right knee.  Right arm.  Using lidocaine , xyZal.    Outpatient Medications Prior to Visit  Medication Sig Dispense Refill   Azelastine HCl 137 MCG/SPRAY SOLN PLACE 2 SPRAYS INTO BOTH NOSTRILS 2 (TWO) TIMES DAILY. USE IN EACH NOSTRIL AS DIRECTED 30 mL 11   Calcium Carbonate-Vit D-Min (CALTRATE 600+D PLUS MINERALS) 600-800 MG-UNIT TABS Take 1 tablet by mouth daily. Taking 1 tablet every other day     cyanocobalamin (VITAMIN B12) 1000 MCG tablet Take 1,000 mcg by mouth daily.     Doxycycline Hyclate 50 MG TABS Take by mouth as needed.     fluticasone (FLONASE) 50 MCG/ACT nasal spray Place 2 sprays into both nostrils daily. 48 g 1   levocetirizine (XYZAL) 5 MG tablet TAKE 1 TABLET BY MOUTH IN THE  EVENING 90 tablet 3   losartan (COZAAR) 100 MG tablet Take 1 tablet (100 mg total) by mouth daily. 90 tablet 3   melatonin 5 MG TABS Take 5 mg by  mouth.     rosuvastatin (CRESTOR) 10 MG tablet TAKE 1 TABLET BY MOUTH  DAILY 90 tablet 3   traZODone (DESYREL) 50 MG tablet TAKE 1/2 TO 1 TABLET BY  MOUTH AT BEDTIME AS NEEDED  FOR SLEEP 90 tablet 3   zolpidem (AMBIEN) 5 MG tablet TAKE 1 TABLET BY MOUTH AT BEDTIME AS NEEDED FOR SLEEP 30 tablet 5   triamcinolone (KENALOG) 0.025 % ointment Apply 1 Application topically 2 (two) times daily. (Patient not taking: Reported on 11/25/2022) 30 g 0   doxycycline (VIBRA-TABS) 100 MG tablet Take 1 tablet (100 mg total) by mouth 2 (two) times daily. (Patient not taking: Reported on 11/25/2022) 14 tablet 0   mupirocin ointment (BACTROBAN) 2 % Apply 1 Application topically 2 (two) times daily. (Patient not taking: Reported on 11/25/2022) 22 g 0   No facility-administered medications prior to visit.    Review of Systems;  Patient denies headache, fevers, malaise, unintentional weight loss,  eye pain, sinus congestion and sinus pain, sore throat, dysphagia,  hemoptysis , cough, dyspnea, wheezing, chest pain, palpitations, orthopnea, edema, abdominal pain, nausea, melena, diarrhea, constipation, flank pain, dysuria, hematuria, urinary  Frequency, nocturia, numbness, tingling, seizures,  Focal weakness, Loss of consciousness,  Tremor,  depression, anxiety, and suicidal ideation.      Objective:  BP 126/70   Pulse 72   Ht  5\' 5"  (1.651 m)   Wt 143 lb 12.8 oz (65.2 kg)   SpO2 98%   BMI 23.93 kg/m   BP Readings from Last 3 Encounters:  11/25/22 126/70  06/03/22 130/78  05/22/22 (!) 120/58    Wt Readings from Last 3 Encounters:  11/25/22 143 lb 12.8 oz (65.2 kg)  07/16/22 147 lb (66.7 kg)  06/03/22 147 lb 12.8 oz (67 kg)    Physical Exam Vitals reviewed.  Constitutional:      General: She is not in acute distress.    Appearance: Normal appearance. She is normal weight. She is not ill-appearing, toxic-appearing or diaphoretic.  HENT:     Head: Normocephalic.  Eyes:     General: No scleral icterus.        Right eye: No discharge.        Left eye: No discharge.     Conjunctiva/sclera: Conjunctivae normal.  Musculoskeletal:        General: Normal range of motion.  Skin:    General: Skin is warm and dry.     Findings: Erythema and rash present. Rash is pustular.       Neurological:     General: No focal deficit present.     Mental Status: She is alert and oriented to person, place, and time. Mental status is at baseline.  Psychiatric:        Mood and Affect: Mood normal.        Behavior: Behavior normal.        Thought Content: Thought content normal.        Judgment: Judgment normal.   Lab Results  Component Value Date   HGBA1C 5.9 05/12/2022   HGBA1C 6.0 11/04/2021   HGBA1C 5.9 05/07/2021    Lab Results  Component Value Date   CREATININE 0.88 11/21/2022   CREATININE 0.80 05/12/2022   CREATININE 0.73 11/04/2021    Lab Results  Component Value Date   WBC 5.7 11/21/2022   HGB 13.3 11/21/2022   HCT 41.1 11/21/2022   PLT 216.0 11/21/2022   GLUCOSE 90 11/21/2022   CHOL 169 11/21/2022   TRIG 111.0 11/21/2022   HDL 55.60 11/21/2022   LDLDIRECT 94.0 11/21/2022   LDLCALC 91 11/21/2022   ALT 15 11/21/2022   AST 14 11/21/2022   NA 141 11/21/2022   K 4.1 11/21/2022   CL 105 11/21/2022   CREATININE 0.88 11/21/2022   BUN 13 11/21/2022   CO2 29 11/21/2022   TSH 2.24 11/21/2022   HGBA1C 5.9 05/12/2022   MICROALBUR 8.9 (H) 11/04/2021    MM 3D SCREEN BREAST BILATERAL  Result Date: 11/27/2021 CLINICAL DATA:  Screening. EXAM: DIGITAL SCREENING BILATERAL MAMMOGRAM WITH TOMOSYNTHESIS AND CAD TECHNIQUE: Bilateral screening digital craniocaudal and mediolateral oblique mammograms were obtained. Bilateral screening digital breast tomosynthesis was performed. The images were evaluated with computer-aided detection. COMPARISON:  Previous exam(s). ACR Breast Density Category b: There are scattered areas of fibroglandular density. FINDINGS: There are no findings suspicious for  malignancy. IMPRESSION: No mammographic evidence of malignancy. A result letter of this screening mammogram will be mailed directly to the patient. RECOMMENDATION: Screening mammogram in one year. (Code:SM-B-01Y) BI-RADS CATEGORY  1: Negative. Electronically Signed   By: Elberta Fortis M.D.   On: 11/27/2021 15:39    Assessment & Plan:  .Fire ant bite, accidental or unintentional, initial encounter Assessment & Plan: She has a dozen pustular bites with surrounding erythema on right knee .  Rec adding doxycycline to antihistamine regimen  Chronic insomnia Assessment & Plan: Managed with trazodone and careful adherence to all sleep hygiene advise.  She uses  ambien sparingly for prlonoged  sleep latency    Mixed hyperlipidemia Assessment & Plan: LDL is at goal on rosuvastatin  nd LFTs are normal .  No changes today   Lab Results  Component Value Date   CHOL 169 11/21/2022   HDL 55.60 11/21/2022   LDLCALC 91 11/21/2022   LDLDIRECT 94.0 11/21/2022   TRIG 111.0 11/21/2022   CHOLHDL 3 11/21/2022      Avulsion of toenail, subsequent encounter Assessment & Plan: She did not lose the nail.  The nail is growing out andis painless    Other orders -     Rosuvastatin Calcium; Take 1 tablet (10 mg total) by mouth daily.  Dispense: 90 tablet; Refill: 1 -     traZODone HCl; Take 0.5-1 tablets (25-50 mg total) by mouth at bedtime as needed. for sleep  Dispense: 90 tablet; Refill: 3 -     Zolpidem Tartrate; Take 1 tablet (5 mg total) by mouth at bedtime as needed. for sleep  Dispense: 90 tablet; Refill: 1     Follow-up: No follow-ups on file.   Sherlene Shams, MD

## 2022-11-25 NOTE — Assessment & Plan Note (Signed)
She did not lose the nail.  The nail is growing out andis painless

## 2022-12-02 ENCOUNTER — Ambulatory Visit
Admission: RE | Admit: 2022-12-02 | Discharge: 2022-12-02 | Disposition: A | Discharge status: Home / Self Care | Payer: Medicare HMO | Source: Ambulatory Visit | Attending: Internal Medicine | Admitting: Internal Medicine

## 2022-12-02 DIAGNOSIS — Z1231 Encounter for screening mammogram for malignant neoplasm of breast: Secondary | ICD-10-CM | POA: Insufficient documentation

## 2023-02-07 ENCOUNTER — Other Ambulatory Visit: Payer: Self-pay | Admitting: Internal Medicine

## 2023-04-13 ENCOUNTER — Telehealth: Payer: Self-pay

## 2023-04-13 MED ORDER — ZOLPIDEM TARTRATE 5 MG PO TABS: 5.0000 mg | ORAL_TABLET | Freq: Every evening | ORAL | 1 refills | Status: DC | PRN

## 2023-04-13 NOTE — Telephone Encounter (Signed)
 Spoke with [facility] and canceled all refills on file for Zolpidem and Dr. Darrick Huntsman has sent a rx to the local pharmacy. Pt is aware.

## 2023-04-13 NOTE — Telephone Encounter (Signed)
 If okay to send the Zolpidem to her local pharmacy I will need to call [facility] so they place a reversal on the rx they have.

## 2023-04-13 NOTE — Telephone Encounter (Signed)
 [facility] called back asking for a new rx for the Zolpidem. I advised that the patient requested to cancel the home delivery and get the rx sent locally. Pharmacist stated that if the patient will get the med locally, the doctor would need to call [phone] to approve the reversal.

## 2023-04-13 NOTE — Telephone Encounter (Signed)
 Copied from CRM [id number]. Topic: Clinical - Prescription Issue >> Apr 13, 2023  8:51 AM Isabell A wrote: Reason for CRM: Patient is wanting to speak with the nurse who handles [facility] prescriptions - this is in regard to her Zolpidem,  the prescription was not delivered when it should have been and it required a signature. Patient would like to cancel [facility] and have it sent to her local pharmacy.  Callback number: [phone]

## 2023-04-13 NOTE — Addendum Note (Signed)
 Addended by: Sherlene Shams on: 04/13/2023 05:19 PM   Modules accepted: Orders

## 2023-04-15 IMAGING — MG MM DIGITAL SCREENING BILAT W/ TOMO AND CAD
6 of 10 series · 6 of 30 positions shown · non-contrast
Comparison: Previous exam(s).

CLINICAL DATA: Screening.

EXAM:
DIGITAL SCREENING BILATERAL MAMMOGRAM WITH TOMOSYNTHESIS AND CAD
TECHNIQUE: Bilateral screening digital craniocaudal and mediolateral oblique
mammograms were obtained. Bilateral screening digital breast
tomosynthesis was performed. The images were evaluated with
computer-aided detection.

[R CC synth-2D]
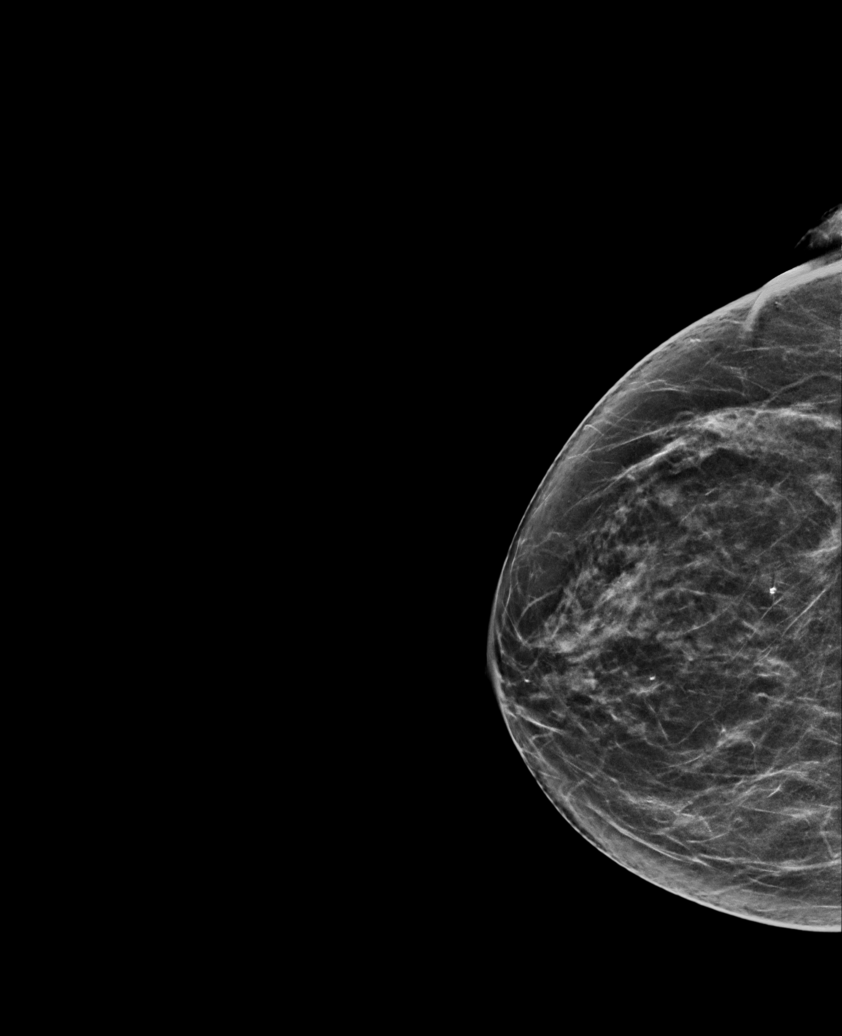

[L MLO synth-2D]
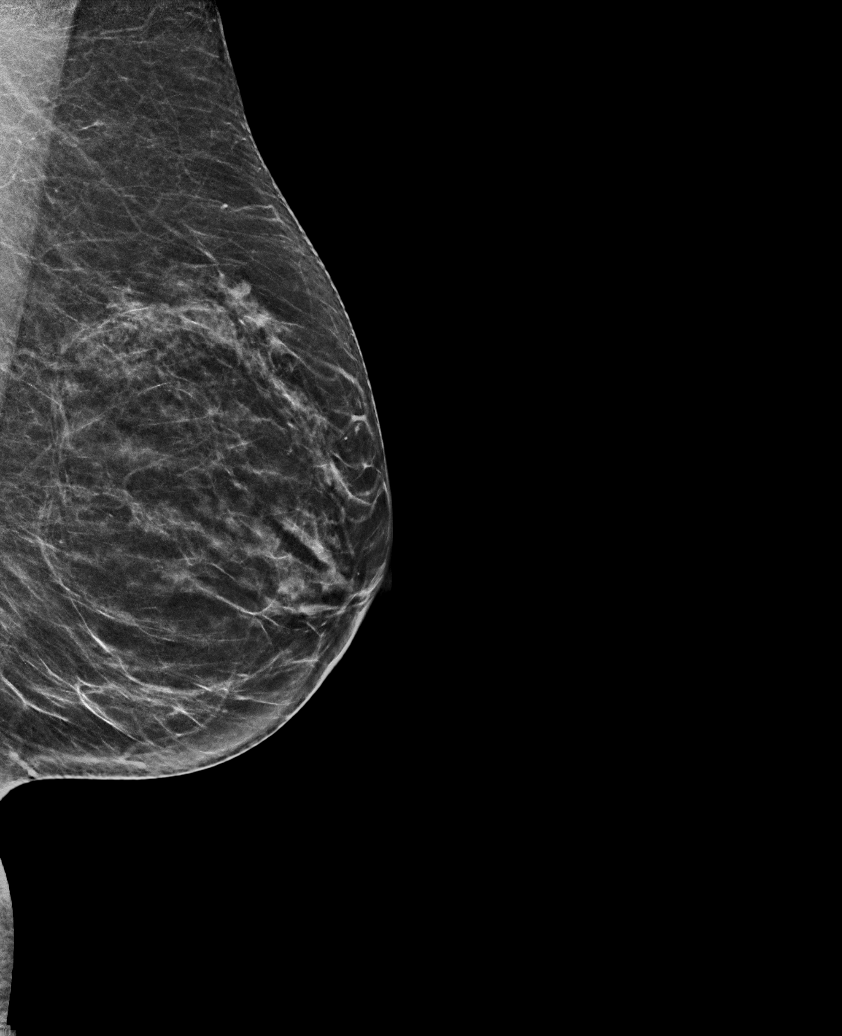

[L CC synth-2D]
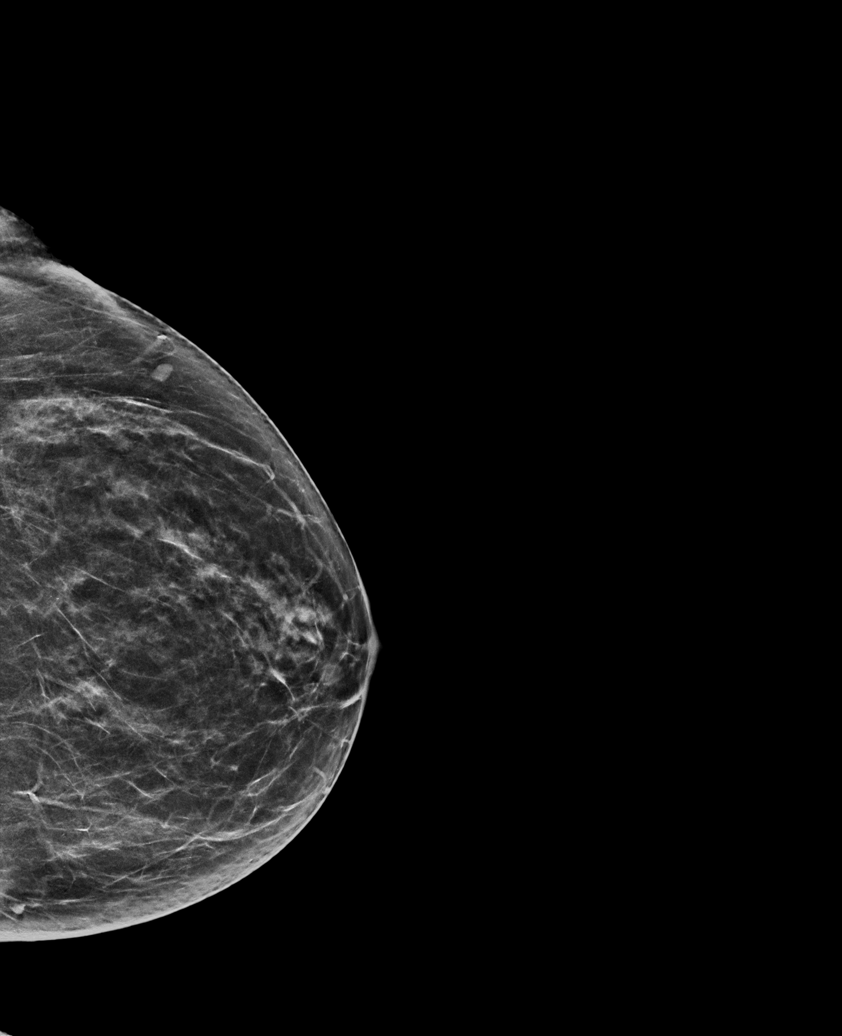

[R MLO synth-2D (1 of 2)]
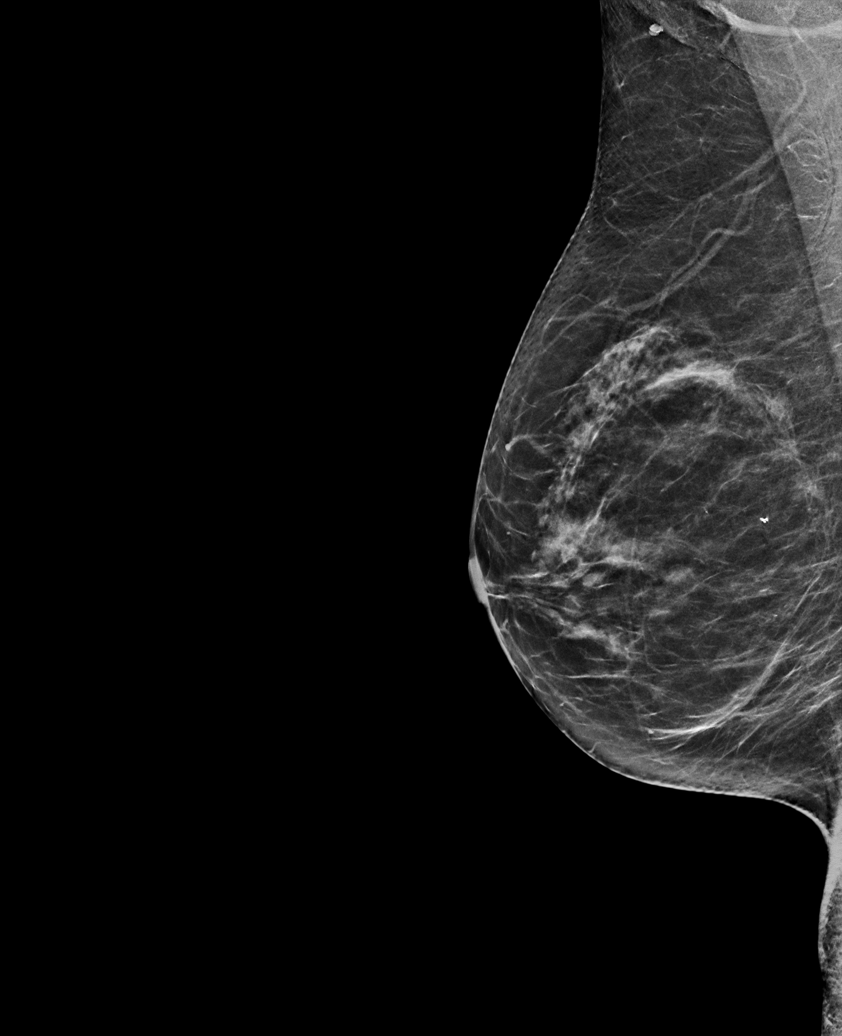

[R MLO synth-2D (2 of 2)]
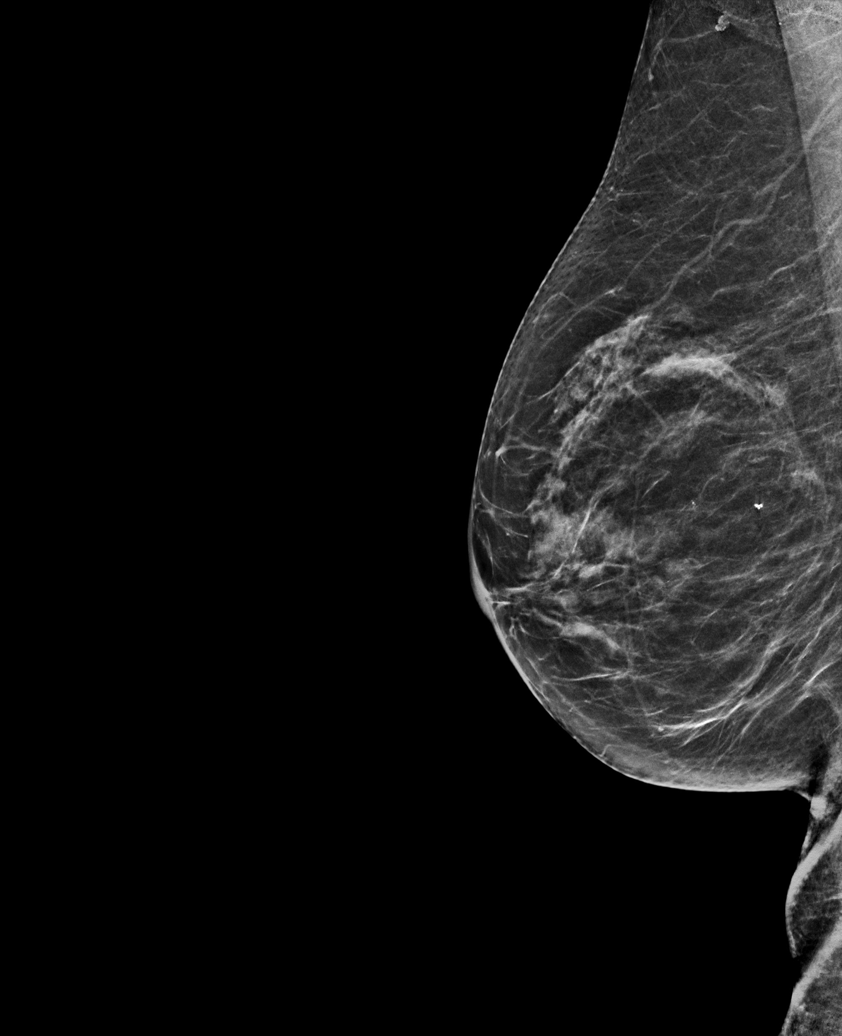

[R MLO tomo · tomo slice 31/61.0]
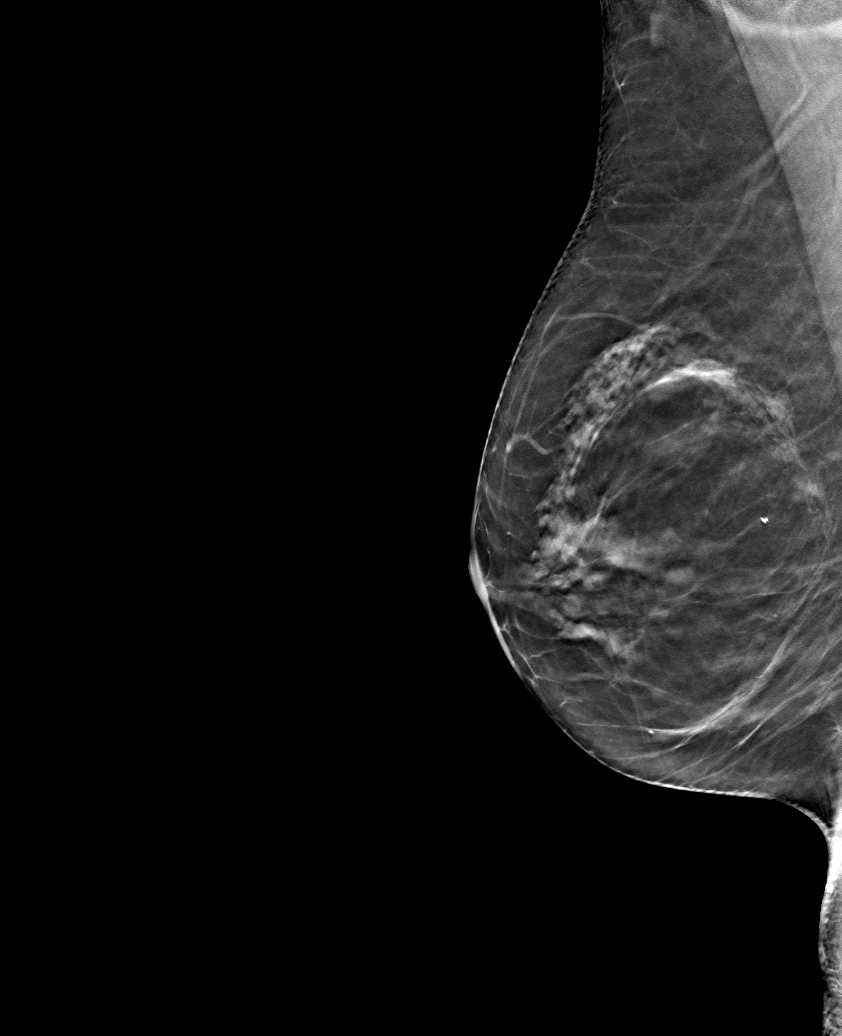

[6 of 30 positions shown; findings below may reference images not displayed]

ACR Breast Density Category b: There are scattered areas of
fibroglandular density.
FINDINGS: There are no findings suspicious for malignancy.
IMPRESSION: No mammographic evidence of malignancy. A result letter of this
screening mammogram will be mailed directly to the patient.

RECOMMENDATION:
Screening mammogram in one year. (Code:51-O-LD2)

BI-RADS CATEGORY  1: Negative.

## 2023-07-07 ENCOUNTER — Other Ambulatory Visit: Payer: Self-pay | Admitting: Internal Medicine

## 2023-07-20 ENCOUNTER — Telehealth: Payer: Self-pay | Admitting: Internal Medicine

## 2023-07-20 DIAGNOSIS — R7303 Prediabetes: Secondary | ICD-10-CM

## 2023-07-20 DIAGNOSIS — E782 Mixed hyperlipidemia: Secondary | ICD-10-CM

## 2023-07-20 DIAGNOSIS — I10 Essential (primary) hypertension: Secondary | ICD-10-CM

## 2023-07-20 NOTE — Telephone Encounter (Signed)
 Patient need labs ordered please

## 2023-08-03 ENCOUNTER — Other Ambulatory Visit (INDEPENDENT_AMBULATORY_CARE_PROVIDER_SITE_OTHER)

## 2023-08-03 DIAGNOSIS — I10 Essential (primary) hypertension: Secondary | ICD-10-CM

## 2023-08-03 DIAGNOSIS — R7303 Prediabetes: Secondary | ICD-10-CM

## 2023-08-03 DIAGNOSIS — E782 Mixed hyperlipidemia: Secondary | ICD-10-CM

## 2023-08-03 LAB — LIPID PANEL W/REFLEX DIRECT LDL
Cholesterol: 178 mg/dL (ref <200)
HDL: 64 mg/dL (ref >=50)
LDL Cholesterol (Calc): 94 mg/dL
Non-HDL Cholesterol (Calc): 114 mg/dL (ref <130)
Total CHOL/HDL Ratio: 2.8 (calc) (ref <5.0)
Triglycerides: 104 mg/dL (ref <150)

## 2023-08-03 LAB — COMPREHENSIVE METABOLIC PANEL WITH GFR
ALT: 15 U/L (ref 0–35)
AST: 14 U/L (ref 0–37)
Albumin: 4.2 g/dL (ref 3.5–5.2)
Alkaline Phosphatase: 65 U/L (ref 39–117)
BUN: 15 mg/dL (ref 6–23)
CO2: 28 meq/L (ref 19–32)
Calcium: 9.4 mg/dL (ref 8.4–10.5)
Chloride: 104 meq/L (ref 96–112)
Creatinine, Ser: 0.87 mg/dL (ref 0.40–1.20)
GFR: 67.79 mL/min (ref >60.00)
Glucose, Bld: 98 mg/dL (ref 70–99)
Potassium: 4.7 meq/L (ref 3.5–5.1)
Sodium: 139 meq/L (ref 135–145)
Total Bilirubin: 0.9 mg/dL (ref 0.2–1.2)
Total Protein: 6.9 g/dL (ref 6.0–8.3)

## 2023-08-03 LAB — HEMOGLOBIN A1C: Hgb A1c MFr Bld: 6.1 % (ref 4.6–6.5)

## 2023-08-05 ENCOUNTER — Ambulatory Visit (INDEPENDENT_AMBULATORY_CARE_PROVIDER_SITE_OTHER): Admitting: Internal Medicine

## 2023-08-05 ENCOUNTER — Encounter: Payer: Self-pay | Admitting: Internal Medicine

## 2023-08-05 VITALS — BP 124/68 | HR 82 | Ht 65.0 in | Wt 143.4 lb

## 2023-08-05 DIAGNOSIS — I700 Atherosclerosis of aorta: Secondary | ICD-10-CM | POA: Diagnosis not present

## 2023-08-05 DIAGNOSIS — I10 Essential (primary) hypertension: Secondary | ICD-10-CM

## 2023-08-05 DIAGNOSIS — R7303 Prediabetes: Secondary | ICD-10-CM | POA: Diagnosis not present

## 2023-08-05 DIAGNOSIS — Z0000 Encounter for general adult medical examination without abnormal findings: Secondary | ICD-10-CM | POA: Diagnosis not present

## 2023-08-05 DIAGNOSIS — E782 Mixed hyperlipidemia: Secondary | ICD-10-CM

## 2023-08-05 MED ORDER — AZELASTINE HCL 137 MCG/SPRAY NA SOLN: NASAL | 3 refills | Status: AC

## 2023-08-05 MED ORDER — ROSUVASTATIN CALCIUM 10 MG PO TABS: 10.0000 mg | ORAL_TABLET | Freq: Every day | ORAL | 1 refills | Status: AC

## 2023-08-05 MED ORDER — LEVOCETIRIZINE DIHYDROCHLORIDE 5 MG PO TABS: 5.0000 mg | ORAL_TABLET | Freq: Every evening | ORAL | 3 refills | Status: AC

## 2023-08-05 MED ORDER — DOXYCYCLINE HYCLATE 100 MG PO TABS: 100.0000 mg | ORAL_TABLET | Freq: Two times a day (BID) | ORAL | 0 refills | Status: AC

## 2023-08-05 NOTE — Assessment & Plan Note (Signed)
 Suggested on chest film from 2020.  She is tolerating high potency statin .   Lab Results  Component Value Date   CHOL 178 08/03/2023   HDL 64 08/03/2023   LDLCALC 94 08/03/2023   LDLDIRECT 94.0 11/21/2022   TRIG 104 08/03/2023   CHOLHDL 2.8 08/03/2023

## 2023-08-05 NOTE — Assessment & Plan Note (Signed)
 She has lowered her a1c in the past into  normal range u the past with increased exercise .  Diet and exercise counselling given

## 2023-08-05 NOTE — Progress Notes (Unsigned)
 Patient ID: Amber Sullivan, female    DOB: Oct 15, 1955  Age: 68 y.o. MRN: [id number]  The patient is here for annual preventive examination and management of other chronic and acute problems.   The risk factors are reflected in the social history.   The roster of all physicians providing medical care to patient - is listed in the Snapshot section of the chart.   Activities of daily living:  The patient is 100% independent in all ADLs: dressing, toileting, feeding as well as independent mobility   Home safety : The patient has smoke detectors in the home. They wear seatbelts.  There are no unsecured firearms at home. There is no violence in the home.    There is no risks for hepatitis, STDs or HIV. There is no   history of blood transfusion. They have no travel history to infectious disease endemic areas of the world.   The patient has seen their dentist in the last six month. They have seen their eye doctor in the last year. The patinet  denies slight hearing difficulty with regard to whispered voices and some television programs.  They have deferred audiologic testing in the last year.  They do not  have excessive sun exposure. Discussed the need for sun protection: hats, long sleeves and use of sunscreen if there is significant sun exposure.    Diet: the importance of a healthy diet is discussed. They do have a healthy diet.   The benefits of regular aerobic exercise were discussed. The patient  exercises 5 days per week  for  60 minutes.    Depression screen: there are no signs or vegative symptoms of depression- irritability, change in appetite, anhedonia, sadness/tearfullness.   The following portions of the patient's history were reviewed and updated as appropriate: allergies, current medications, past family history, past medical history,  past surgical history, past social history  and problem list.   Visual acuity was not assessed per patient preference since the patient has  regular follow up with an  ophthalmologist. Hearing and body mass index were assessed and reviewed.    During the course of the visit the patient was educated and counseled about appropriate screening and preventive services including : fall prevention , diabetes screening, nutrition counseling, colorectal cancer screening, and recommended immunizations.    Chief Complaint:  No issues /      Review of Symptoms  Patient denies headache, fevers, malaise, unintentional weight loss, skin rash, eye pain, sinus congestion and sinus pain, sore throat, dysphagia,  hemoptysis , cough, dyspnea, wheezing, chest pain, palpitations, orthopnea, edema, abdominal pain, nausea, melena, diarrhea, constipation, flank pain, dysuria, hematuria, urinary  Frequency, nocturia, numbness, tingling, seizures,  Focal weakness, Loss of consciousness,  Tremor, insomnia, depression, anxiety, and suicidal ideation.    Physical Exam:  BP 124/68   Pulse 82   Ht 5' 5 (1.651 m)   Wt 143 lb 6.4 oz (65 kg)   SpO2 97%   BMI 23.86 kg/m    Physical Exam Vitals reviewed.  Constitutional:      General: She is not in acute distress.    Appearance: Normal appearance. She is well-developed and normal weight. She is not ill-appearing, toxic-appearing or diaphoretic.  HENT:     Head: Normocephalic.     Right Ear: Tympanic membrane, ear canal and external ear normal. There is no impacted cerumen.     Left Ear: Tympanic membrane, ear canal and external ear normal. There is no impacted cerumen.  Nose: Nose normal.     Mouth/Throat:     Mouth: Mucous membranes are moist.     Pharynx: Oropharynx is clear.  Eyes:     General: No scleral icterus.       Right eye: No discharge.        Left eye: No discharge.     Conjunctiva/sclera: Conjunctivae normal.     Pupils: Pupils are equal, round, and reactive to light.  Neck:     Thyroid : No thyromegaly.     Vascular: No carotid bruit or JVD.  Cardiovascular:     Rate and  Rhythm: Normal rate and regular rhythm.     Heart sounds: Normal heart sounds.  Pulmonary:     Effort: Pulmonary effort is normal. No respiratory distress.     Breath sounds: Normal breath sounds.  Chest:  Breasts:    Breasts are symmetrical.     Right: Normal. No swelling, inverted nipple, mass, nipple discharge, skin change or tenderness.     Left: Normal. No swelling, inverted nipple, mass, nipple discharge, skin change or tenderness.  Abdominal:     General: Bowel sounds are normal.     Palpations: Abdomen is soft. There is no mass.     Tenderness: There is no abdominal tenderness. There is no guarding or rebound.  Musculoskeletal:        General: Normal range of motion.     Cervical back: Normal range of motion and neck supple.  Lymphadenopathy:     Cervical: No cervical adenopathy.     Upper Body:     Right upper body: No supraclavicular, axillary or pectoral adenopathy.     Left upper body: No supraclavicular, axillary or pectoral adenopathy.  Skin:    General: Skin is warm and dry.  Neurological:     General: No focal deficit present.     Mental Status: She is alert and oriented to person, place, and time. Mental status is at baseline.  Psychiatric:        Mood and Affect: Mood normal.        Behavior: Behavior normal.        Thought Content: Thought content normal.        Judgment: Judgment normal.     Assessment and Plan: Prediabetes Assessment & Plan: She has lowered her a1c in the past into  normal range tin he past with increased exercise .  She is following a Mediterranean diet and exercising 5 days per week.  She has a normal BMI  Lab Results  Component Value Date   HGBA1C 6.1 08/03/2023      Orders: -     Hemoglobin A1c; Future  Aortic arch atherosclerosis (HCC) Assessment & Plan: Suggested on chest film from 2020.  She is tolerating high potency statin .   Lab Results  Component Value Date   CHOL 178 08/03/2023   HDL 64 08/03/2023   LDLCALC  94 08/03/2023   LDLDIRECT 94.0 11/21/2022   TRIG 104 08/03/2023   CHOLHDL 2.8 08/03/2023      Primary hypertension Assessment & Plan: Well controlled on current losartan  regimen. Renal function an lytes are normal.  no changes today.  Lab Results  Component Value Date   CREATININE 0.87 08/03/2023   Lab Results  Component Value Date   NA 139 08/03/2023   K 4.7 08/03/2023   CL 104 08/03/2023   CO2 28 08/03/2023     Orders: -     Comprehensive metabolic panel with GFR;  Future  Mixed hyperlipidemia Assessment & Plan: LDL is at goal on rosuvastatin   nd LFTs are normal .  No changes today   Lab Results  Component Value Date   CHOL 178 08/03/2023   HDL 64 08/03/2023   LDLCALC 94 08/03/2023   LDLDIRECT 94.0 11/21/2022   TRIG 104 08/03/2023   CHOLHDL 2.8 08/03/2023   Lab Results  Component Value Date   ALT 15 08/03/2023   AST 14 08/03/2023   ALKPHOS 65 08/03/2023   BILITOT 0.9 08/03/2023      Encounter for preventive health examination Assessment & Plan: age appropriate education and counseling updated, referrals for preventative services and immunizations addressed, dietary and smoking counseling addressed, most recent labs reviewed.  I have personally reviewed and have noted:   1) the patient's medical and social history 2) The pt's use of alcohol, tobacco, and illicit drugs 3) The patient's current medications and supplements 4) Functional ability including ADL's, fall risk, home safety risk, hearing and visual impairment 5) Diet and physical activities 6) Evidence for depression or mood disorder 7) The patient's height, weight, and BMI have been recorded in the chart  I have made referrals, and provided counseling and education based on review of the above    Other orders -     Levocetirizine Dihydrochloride ; Take 1 tablet (5 mg total) by mouth every evening.  Dispense: 90 tablet; Refill: 3 -     Rosuvastatin  Calcium ; Take 1 tablet (10 mg total) by mouth  daily.  Dispense: 90 tablet; Refill: 1 -     Doxycycline  Hyclate; Take 1 tablet (100 mg total) by mouth 2 (two) times daily.  Dispense: 14 tablet; Refill: 0 -     Azelastine  HCl; PLACE 2 SPRAYS INTO BOTH NOSTRILS 2 (TWO) TIMES DAILY. USE IN EACH NOSTRIL AS DIRECTED  Dispense: 60 mL; Refill: 3    Return in about 6 months (around 02/05/2024) for hypertension.  Verneita LITTIE Kettering, MD

## 2023-08-07 ENCOUNTER — Encounter: Payer: Self-pay | Admitting: Internal Medicine

## 2023-08-07 NOTE — Assessment & Plan Note (Signed)
 Well controlled on current losartan  regimen. Renal function an lytes are normal.  no changes today.  Lab Results  Component Value Date   CREATININE 0.87 08/03/2023   Lab Results  Component Value Date   NA 139 08/03/2023   K 4.7 08/03/2023   CL 104 08/03/2023   CO2 28 08/03/2023

## 2023-08-07 NOTE — Assessment & Plan Note (Signed)
 LDL is at goal on rosuvastatin   nd LFTs are normal .  No changes today   Lab Results  Component Value Date   CHOL 178 08/03/2023   HDL 64 08/03/2023   LDLCALC 94 08/03/2023   LDLDIRECT 94.0 11/21/2022   TRIG 104 08/03/2023   CHOLHDL 2.8 08/03/2023   Lab Results  Component Value Date   ALT 15 08/03/2023   AST 14 08/03/2023   ALKPHOS 65 08/03/2023   BILITOT 0.9 08/03/2023

## 2023-08-07 NOTE — Assessment & Plan Note (Signed)

## 2023-08-17 ENCOUNTER — Encounter

## 2023-09-30 ENCOUNTER — Encounter: Payer: Self-pay | Admitting: *Deleted

## 2023-10-02 ENCOUNTER — Ambulatory Visit (INDEPENDENT_AMBULATORY_CARE_PROVIDER_SITE_OTHER): Admitting: *Deleted

## 2023-10-02 VITALS — Ht 65.0 in | Wt 140.0 lb

## 2023-10-02 DIAGNOSIS — Z1231 Encounter for screening mammogram for malignant neoplasm of breast: Secondary | ICD-10-CM

## 2023-10-02 DIAGNOSIS — Z0000 Encounter for general adult medical examination without abnormal findings: Secondary | ICD-10-CM | POA: Diagnosis not present
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
## 2023-10-02 NOTE — Patient Instructions (Signed)
 Amber Sullivan,  Thank you for taking the time for your Medicare Wellness Visit. I appreciate your continued commitment to your health goals. Please review the care plan we discussed, and feel free to reach out if I can assist you further.  Medicare recommends these wellness visits once per year to help you and your care team stay ahead of potential health issues. These visits are designed to focus on prevention, allowing your provider to concentrate on managing your acute and chronic conditions during your regular appointments.  Please note that Annual Wellness Visits do not include a physical exam. Some assessments may be limited, especially if the visit was conducted virtually. If needed, we may recommend a separate in-person follow-up with your provider.  Ongoing Care Seeing your primary care provider every 3 to 6 months helps us  monitor your health and provide consistent, personalized care.  Remember to get your annual flu vaccine.  You have an order for:  []   2D Mammogram  [x]   3D Mammogram  []   Bone Density     Please call for appointment:   [facility] [address]. [place], [place] [address] [phone]    Make sure to wear two-piece clothing.  No lotions, powders, or deodorants the day of the appointment. Make sure to bring picture ID and insurance card.  Bring list of medications you are currently taking including any supplements.    Referrals If a referral was made during today's visit and you haven't received any updates within two weeks, please contact the referred provider directly to check on the status.  Recommended Screenings:  Health Maintenance  Topic Date Due   Flu Shot  08/21/2023   Breast Cancer Screening  12/02/2023   COVID-19 Vaccine (8 - Pfizer risk 2024-25 season) 03/08/2024   Medicare Annual Wellness Visit  10/01/2024   Pneumococcal Vaccine for age over 16 (3 of 3 - PCV20 or PCV21) 11/03/2024   Colon Cancer  Screening  08/14/2026   DTaP/Tdap/Td vaccine (3 - Td or Tdap) 11/02/2028   DEXA scan (bone density measurement)  Completed   Hepatitis C Screening  Completed   Zoster (Shingles) Vaccine  Completed   HPV Vaccine  Aged Out   Meningitis B Vaccine  Aged Out       10/02/2023   11:40 AM  Advanced Directives  Does Patient Have a Medical Advance Directive? Yes  Type of Estate agent of [place];Living will  Copy of Healthcare Power of Attorney in Chart? No - copy requested   Advance Care Planning is important because it: Ensures you receive medical care that aligns with your values, goals, and preferences. Provides guidance to your family and loved ones, reducing the emotional burden of decision-making during critical moments.  Vision: Annual vision screenings are recommended for early detection of glaucoma, cataracts, and diabetic retinopathy. These exams can also reveal signs of chronic conditions such as diabetes and high blood pressure.  Dental: Annual dental screenings help detect early signs of oral cancer, gum disease, and other conditions linked to overall health, including heart disease and diabetes.  Please see the attached documents for additional preventive care recommendations.

## 2023-10-12 ENCOUNTER — Encounter: Payer: Self-pay | Admitting: Internal Medicine

## 2023-10-12 ENCOUNTER — Ambulatory Visit (INDEPENDENT_AMBULATORY_CARE_PROVIDER_SITE_OTHER): Admitting: Internal Medicine

## 2023-10-12 VITALS — BP 118/72 | HR 75 | Temp 98.2°F | Ht 65.0 in | Wt 143.8 lb

## 2023-10-12 DIAGNOSIS — J0100 Acute maxillary sinusitis, unspecified: Secondary | ICD-10-CM

## 2023-10-12 DIAGNOSIS — J019 Acute sinusitis, unspecified: Secondary | ICD-10-CM | POA: Insufficient documentation

## 2023-10-12 DIAGNOSIS — I10 Essential (primary) hypertension: Secondary | ICD-10-CM | POA: Diagnosis not present

## 2023-10-12 MED ORDER — AMOXICILLIN-POT CLAVULANATE 875-125 MG PO TABS: 1.0000 | ORAL_TABLET | Freq: Two times a day (BID) | ORAL | 0 refills | Status: DC

## 2023-10-12 NOTE — Progress Notes (Signed)
 Acute Office Visit  Subjective:     Patient ID: Amber Sullivan, female    DOB: 06/01/1955, 68 y.o.   MRN: [id number]  Chief Complaint  Patient presents with   Acute Visit    Head cold x 2 weeks Tested for covid - Negative    Discussed the use of AI scribe software for clinical note transcription with the patient, who gave verbal consent to proceed.  History of Present Illness Amber Sullivan is a 68 year old female who presents with persistent nasal congestion and sinus pressure.  Nasal congestion and sinus pressure - Persistent nasal congestion and sinus pressure for over two weeks - Onset after a head cold contracted while traveling in [place]  - Husband had similar cold that resolved quickly; her symptoms have persisted - Nasal congestion without rhinorrhea - Frontal sinus pressure, worsened by bending forward - No headaches, cough, or sore throat - No ear pain or earache - No chest pain, shortness of breath, or palpitations  Allergic rhinitis and medication use - History of seasonal allergies, typically in winter and spring - Uses azelastine , Xyzal , Flonase  for allergy management - Uses decongestant before flights to maintain right eustachian tube patency, cautious about overuse due to concern for rebound congestion - Regular use of saline nasal spray, which provides symptomatic relief  Antibiotic tolerance and allergies - No known allergies to antibiotics - Previous use of doxycycline  for rosacea and Lyme disease without adverse reactions  Environmental exposures - Frequent travel - [place] and spends time outdoors throughout the year    Review of Systems  Constitutional: Negative.   HENT:  Positive for congestion and sinus pain. Negative for ear discharge and ear pain.   Respiratory: Negative.    Cardiovascular: Negative.   Gastrointestinal: Negative.   Genitourinary: Negative.   Musculoskeletal: Negative.   Neurological: Negative.    Psychiatric/Behavioral: Negative.          Objective:    BP 118/72   Pulse 75   Temp 98.2 F (36.8 C)   Ht 5' 5 (1.651 m)   Wt 143 lb 12.8 oz (65.2 kg)   SpO2 97%   BMI 23.93 kg/m    Physical Exam Constitutional:      Appearance: Normal appearance.  HENT:     Head: Normocephalic and atraumatic.     Right Ear: Tympanic membrane, ear canal and external ear normal.     Left Ear: Tympanic membrane, ear canal and external ear normal.     Nose:     Comments: Patient with pressure over both maxillary sinuses but no tenderness    Mouth/Throat:     Pharynx: Oropharynx is clear. No oropharyngeal exudate or posterior oropharyngeal erythema.  Cardiovascular:     Rate and Rhythm: Normal rate and regular rhythm.     Heart sounds: Normal heart sounds.  Pulmonary:     Effort: Pulmonary effort is normal.     Breath sounds: Normal breath sounds. No wheezing, rhonchi or rales.  Abdominal:     General: Bowel sounds are normal. There is no distension.     Palpations: Abdomen is soft.     Tenderness: There is no abdominal tenderness. There is no guarding or rebound.  Musculoskeletal:        General: No swelling or tenderness.     Cervical back: Neck supple.     Right lower leg: No edema.     Left lower leg: No edema.  Lymphadenopathy:     Cervical: No cervical  adenopathy.  Neurological:     Mental Status: She is alert.  Psychiatric:        Mood and Affect: Mood normal.        Behavior: Behavior normal.     No results found for any visits on 10/12/23.      Assessment & Plan:   Problem List Items Addressed This Visit       Cardiovascular and Mediastinum   Hypertension   - This problem is chronic and stable -Patient blood pressure today is 118/72 which is at goal -Will continue with losartan  100 mg daily -No further workup at this time        Respiratory   Acute sinusitis - Primary   - Patient states approximately 2 weeks ago she developed sinus congestion and  pressure which worsened when she bends her head forward -States that she has been traveling a lot and her husband was sick with similar symptoms but recovered quickly - She denies any fevers chills or ear pain or sore throat -On exam, she has some pressure over her maxillary sinuses but no tenderness.  Ear exam and throat exam are within normal limits.  No cervical lymphadenopathy noted -I suspect she likely has an acute sinusitis causing her symptoms.  The most common cause of this is likely viral.  However, given that her symptoms last more than 2 weeks we will treat her with Augmentin  to cover for a possible bacterial etiology -Patient will continue with conservative measures like warm showers with steam inhalation, Nettie pot and saline nasal rinses in addition to treatment for her allergic rhinitis (azelastine , Xyzal ) -Return precautions given to the patient      Relevant Medications   amoxicillin -clavulanate (AUGMENTIN ) 875-125 MG tablet    Meds ordered this encounter  Medications   amoxicillin -clavulanate (AUGMENTIN ) 875-125 MG tablet    Sig: Take 1 tablet by mouth 2 (two) times daily.    Dispense:  10 tablet    Refill:  0    No follow-ups on file.  Dorian Renfro, MD

## 2023-10-12 NOTE — Patient Instructions (Signed)
  VISIT SUMMARY: Today, you were seen for persistent nasal congestion and sinus pressure that have lasted for over two weeks. These symptoms began after a head cold you contracted while traveling in [place] . Your husband had a similar cold that resolved quickly, but your symptoms have persisted.  YOUR PLAN: -ACUTE SINUSITIS: Acute sinusitis is an inflammation of the sinuses that can cause congestion and pressure, often following a cold. Since your symptoms have lasted more than two weeks, there is a possibility of a bacterial infection. You have been prescribed Augmentin  (amoxicillin /clavulanate) to address this potential bacterial infection. Be aware that this medication can cause gastrointestinal side effects. Continue using saline nasal spray for relief, take warm showers with steam inhalation, and consider using a Medipod to help with sinus congestion.  INSTRUCTIONS: Please take the prescribed Augmentin  as directed. Continue with your current use of saline nasal spray, and try warm showers with steam inhalation and a Medipod for additional relief. If your symptoms do not improve or worsen, please schedule a follow-up appointment.  Of note, please consider using a Nettie pot to help with sinus congestion (not Medipod)                      Contains text generated by [employer]bridge.                                 Contains text generated by [employer]bridge.

## 2023-10-12 NOTE — Assessment & Plan Note (Signed)
-   Patient states approximately 2 weeks ago she developed sinus congestion and pressure which worsened when she bends her head forward -States that she has been traveling a lot and her husband was sick with similar symptoms but recovered quickly - She denies any fevers chills or ear pain or sore throat -On exam, she has some pressure over her maxillary sinuses but no tenderness.  Ear exam and throat exam are within normal limits.  No cervical lymphadenopathy noted -I suspect she likely has an acute sinusitis causing her symptoms.  The most common cause of this is likely viral.  However, given that her symptoms last more than 2 weeks we will treat her with Augmentin  to cover for a possible bacterial etiology -Patient will continue with conservative measures like warm showers with steam inhalation, Nettie pot and saline nasal rinses in addition to treatment for her allergic rhinitis (azelastine , Xyzal ) -Return precautions given to the patient

## 2023-10-12 NOTE — Assessment & Plan Note (Signed)
-   This problem is chronic and stable -Patient blood pressure today is 118/72 which is at goal -Will continue with losartan  100 mg daily -No further workup at this time

## 2023-10-15 ENCOUNTER — Telehealth: Payer: Self-pay

## 2023-10-15 NOTE — Telephone Encounter (Signed)
 Spoke with pt and advised her per Dr. Lula message below that she should use Afrin Nasal Spray before her flight and before her return home flight. Pt gave a verbal understanding.

## 2023-10-15 NOTE — Telephone Encounter (Signed)
 Copied from CRM #[id number]. Topic: General - Other >> Oct 15, 2023  9:11 AM Alfonso HERO wrote: Reason for CRM: Patient calling because she is flying out today and wants to know if she should use decongestive spray or is she good with just taking the antibiotics.

## 2023-10-15 NOTE — Telephone Encounter (Signed)
 Pt was seen by Dr. Onesimo on 10/12/2023 for a sinus infection.

## 2023-11-09 ENCOUNTER — Encounter: Payer: Self-pay | Admitting: Internal Medicine

## 2023-12-04 ENCOUNTER — Telehealth: Payer: Self-pay

## 2023-12-04 DIAGNOSIS — G8929 Other chronic pain: Secondary | ICD-10-CM

## 2023-12-04 NOTE — Telephone Encounter (Signed)
 Copied from CRM [id number]. Topic: General - Other >> Dec 04, 2023  9:39 AM Avram MATSU wrote: Reason for CRM: patient stated last time she saw her provider, they discussed about ppt getting an xray for her hand. She would like a callback to follow up. [phone] (M)

## 2023-12-04 NOTE — Telephone Encounter (Signed)
 Copied from CRM #[id number]. Topic: General - Other >> Dec 04, 2023  1:52 PM Amber Sullivan wrote: Reason for CRM: Patient returning call from [place], Called CAL advised currently away from desk. -- Advised patient will receive a call before end of day.

## 2023-12-04 NOTE — Telephone Encounter (Signed)
 See previous telephone note from 12/04/2023

## 2023-12-04 NOTE — Telephone Encounter (Signed)
 LVM to call back to office to discuss message below

## 2023-12-04 NOTE — Telephone Encounter (Signed)
 Spoke to pt informed her that Dr Marylynn is out of the office today but I will let make sure that she gets her message when she returns on Mon  12/07/23

## 2023-12-07 NOTE — Telephone Encounter (Signed)
 There is another telephone encounter in patient's chart. Patient is requesting provider's recommendations on a hand x-ray, patient states it was discussed with provider, but do not see it in patient's chart anywhere. Please advise?

## 2023-12-09 NOTE — Addendum Note (Signed)
 Addended by: MARYLYNN VERNEITA CROME on: 12/09/2023 12:48 PM   Modules accepted: Orders

## 2023-12-09 NOTE — Telephone Encounter (Signed)
 open in error

## 2023-12-22 ENCOUNTER — Ambulatory Visit
Admission: RE | Admit: 2023-12-22 | Discharge: 2023-12-22 | Disposition: A | Discharge status: Home / Self Care | Source: Ambulatory Visit | Attending: Internal Medicine | Admitting: Internal Medicine

## 2023-12-22 ENCOUNTER — Other Ambulatory Visit: Payer: Self-pay | Admitting: Internal Medicine

## 2023-12-22 DIAGNOSIS — Z1231 Encounter for screening mammogram for malignant neoplasm of breast: Secondary | ICD-10-CM | POA: Diagnosis present

## 2023-12-23 ENCOUNTER — Ambulatory Visit
Admission: RE | Admit: 2023-12-23 | Discharge: 2023-12-23 | Disposition: A | Source: Ambulatory Visit | Attending: Internal Medicine | Admitting: Internal Medicine

## 2023-12-23 ENCOUNTER — Ambulatory Visit
Admission: RE | Admit: 2023-12-23 | Discharge: 2023-12-23 | Disposition: A | Attending: Internal Medicine | Admitting: Internal Medicine

## 2023-12-23 DIAGNOSIS — M79645 Pain in left finger(s): Secondary | ICD-10-CM

## 2023-12-23 DIAGNOSIS — G8929 Other chronic pain: Secondary | ICD-10-CM | POA: Insufficient documentation

## 2023-12-23 DIAGNOSIS — M79644 Pain in right finger(s): Secondary | ICD-10-CM | POA: Insufficient documentation

## 2023-12-29 ENCOUNTER — Encounter

## 2023-12-31 ENCOUNTER — Ambulatory Visit: Payer: Self-pay | Admitting: Internal Medicine

## 2024-01-22 ENCOUNTER — Other Ambulatory Visit: Payer: Self-pay | Admitting: Internal Medicine

## 2024-02-08 ENCOUNTER — Other Ambulatory Visit (INDEPENDENT_AMBULATORY_CARE_PROVIDER_SITE_OTHER)

## 2024-02-08 DIAGNOSIS — R7303 Prediabetes: Secondary | ICD-10-CM | POA: Diagnosis not present

## 2024-02-08 DIAGNOSIS — I10 Essential (primary) hypertension: Secondary | ICD-10-CM | POA: Diagnosis not present

## 2024-02-08 LAB — COMPREHENSIVE METABOLIC PANEL WITH GFR
ALT: 13 U/L (ref 3–35)
AST: 14 U/L (ref 5–37)
Albumin: 3.9 g/dL (ref 3.5–5.2)
Alkaline Phosphatase: 63 U/L (ref 39–117)
BUN: 14 mg/dL (ref 6–23)
CO2: 28 meq/L (ref 19–32)
Calcium: 9.2 mg/dL (ref 8.4–10.5)
Chloride: 106 meq/L (ref 96–112)
Creatinine, Ser: 0.76 mg/dL (ref 0.40–1.20)
GFR: 79.44 mL/min
Glucose, Bld: 94 mg/dL (ref 70–99)
Potassium: 4.6 meq/L (ref 3.5–5.1)
Sodium: 140 meq/L (ref 135–145)
Total Bilirubin: 0.6 mg/dL (ref 0.2–1.2)
Total Protein: 6.4 g/dL (ref 6.0–8.3)

## 2024-02-08 LAB — HEMOGLOBIN A1C: Hgb A1c MFr Bld: 5.8 % (ref 4.6–6.5)

## 2024-02-09 ENCOUNTER — Ambulatory Visit: Payer: Self-pay | Admitting: Internal Medicine

## 2024-02-21 ENCOUNTER — Encounter: Payer: Self-pay | Admitting: Internal Medicine

## 2024-02-25 ENCOUNTER — Other Ambulatory Visit: Payer: Self-pay | Admitting: Internal Medicine

## 2024-08-10 ENCOUNTER — Encounter: Admitting: Internal Medicine

## 2024-10-14 ENCOUNTER — Ambulatory Visit
